# Patient Record
Sex: Female | Born: 1985 | Race: White | Hispanic: No | Marital: Married | Smoking: Former smoker
Health system: Southern US, Community
[De-identification: ages and names within clinical notes are randomized; demographics above are authoritative.]

## PROBLEM LIST (undated history)

## (undated) DIAGNOSIS — F329 Major depressive disorder, single episode, unspecified: Secondary | ICD-10-CM

## (undated) DIAGNOSIS — R51 Headache: Secondary | ICD-10-CM

## (undated) DIAGNOSIS — J189 Pneumonia, unspecified organism: Secondary | ICD-10-CM

## (undated) DIAGNOSIS — F32A Depression, unspecified: Secondary | ICD-10-CM

## (undated) DIAGNOSIS — F1990 Other psychoactive substance use, unspecified, uncomplicated: Secondary | ICD-10-CM

## (undated) DIAGNOSIS — F1910 Other psychoactive substance abuse, uncomplicated: Secondary | ICD-10-CM

## (undated) DIAGNOSIS — F419 Anxiety disorder, unspecified: Secondary | ICD-10-CM

## (undated) HISTORY — PX: TYMPANOSTOMY TUBE PLACEMENT: SHX32

## (undated) HISTORY — PX: MASTOIDECTOMY REVISION: SUR856

## (undated) HISTORY — DX: Pneumonia, unspecified organism: J18.9

## (undated) HISTORY — PX: CHOLECYSTECTOMY: SHX55

## (undated) HISTORY — PX: OVARIAN CYST REMOVAL: SHX89

## (undated) HISTORY — PX: FACIAL RECONSTRUCTION SURGERY: SHX631

---
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
## 2006-08-07 ENCOUNTER — Emergency Department (HOSPITAL_COMMUNITY): Admission: EM | Admit: 2006-08-07 | Discharge: 2006-08-07 | Payer: Self-pay | Admitting: Emergency Medicine

## 2007-03-21 ENCOUNTER — Inpatient Hospital Stay (HOSPITAL_COMMUNITY): Admission: AD | Admit: 2007-03-21 | Discharge: 2007-03-21 | Payer: Self-pay | Admitting: Obstetrics and Gynecology

## 2007-03-27 ENCOUNTER — Inpatient Hospital Stay (HOSPITAL_COMMUNITY): Admission: AD | Admit: 2007-03-27 | Discharge: 2007-03-27 | Payer: Self-pay | Admitting: Obstetrics and Gynecology

## 2007-03-27 ENCOUNTER — Inpatient Hospital Stay (HOSPITAL_COMMUNITY): Admission: AD | Admit: 2007-03-27 | Discharge: 2007-03-30 | Payer: Self-pay | Admitting: Obstetrics and Gynecology

## 2008-02-14 ENCOUNTER — Emergency Department (HOSPITAL_COMMUNITY): Admission: EM | Admit: 2008-02-14 | Discharge: 2008-02-14 | Payer: Self-pay | Admitting: Emergency Medicine

## 2009-09-04 ENCOUNTER — Inpatient Hospital Stay (HOSPITAL_COMMUNITY): Admission: AD | Admit: 2009-09-04 | Discharge: 2009-09-06 | Payer: Self-pay | Admitting: Obstetrics and Gynecology

## 2010-05-18 LAB — COMPREHENSIVE METABOLIC PANEL
ALT: 15 U/L (ref 0–35)
Albumin: 3.3 g/dL — ABNORMAL LOW (ref 3.5–5.2)
Alkaline Phosphatase: 42 U/L (ref 39–117)
Chloride: 106 mEq/L (ref 96–112)
Glucose, Bld: 110 mg/dL — ABNORMAL HIGH (ref 70–99)
Potassium: 3.2 mEq/L — ABNORMAL LOW (ref 3.5–5.1)
Sodium: 136 mEq/L (ref 135–145)
Total Bilirubin: 0.4 mg/dL (ref 0.3–1.2)
Total Protein: 6.1 g/dL (ref 6.0–8.3)

## 2010-05-18 LAB — DIFFERENTIAL
Basophils Relative: 1 % (ref 0–1)
Eosinophils Absolute: 0.3 10*3/uL (ref 0.0–0.7)
Monocytes Absolute: 0.4 10*3/uL (ref 0.1–1.0)
Monocytes Relative: 5 % (ref 3–12)
Neutrophils Relative %: 40 % — ABNORMAL LOW (ref 43–77)

## 2010-05-18 LAB — CBC
HCT: 34.5 % — ABNORMAL LOW (ref 36.0–46.0)
RBC: 3.68 MIL/uL — ABNORMAL LOW (ref 3.87–5.11)
RDW: 12.7 % (ref 11.5–15.5)
WBC: 8.3 10*3/uL (ref 4.0–10.5)

## 2010-05-18 LAB — URINALYSIS, ROUTINE W REFLEX MICROSCOPIC
Glucose, UA: NEGATIVE mg/dL
Leukocytes, UA: NEGATIVE
Protein, ur: NEGATIVE mg/dL
Specific Gravity, Urine: 1.015 (ref 1.005–1.030)
pH: 7 (ref 5.0–8.0)

## 2010-05-18 LAB — URINE MICROSCOPIC-ADD ON

## 2010-05-18 LAB — AMYLASE: Amylase: 44 U/L (ref 0–105)

## 2010-05-18 LAB — URINE CULTURE: Colony Count: NO GROWTH

## 2010-07-18 NOTE — Op Note (Signed)
[place]. [facility]  Patient:    Danielle Chandler, Danielle Chandler Visit Number: [id number] MRN: [id number]          Service Type: DSU Location: [facility] Attending Physician:  Susy Frizzle Dictated by:   Jeannett Senior Pollyann Kennedy, M.D. Proc. Date: 05/09/01 Admit Date:  05/09/2001                             Operative Report  PREOPERATIVE DIAGNOSIS:  Eustachian tube dysfunction, tympanic membrane atelectasis with conductive hearing loss.  POSTOPERATIVE DIAGNOSIS:  Eustachian tube dysfunction, tympanic membrane atelectasis with conductive hearing loss.  OPERATION PERFORMED:  Bilateral myringotomies with T-tube insertion.  SURGEON:  Jefry H. Pollyann Kennedy, M.D.  ANESTHESIA:  Mask inhalation.  COMPLICATIONS:  None.  FINDINGS:  Severe thickening, tympanosclerosis and atelectasis of the right tympanic membrane.  Severe retraction atelectasis of the left tympanic membrane with a large monomer of the anterior/inferior quadrant.  Small amount of fluid on the right side, none on the left.  The patient tolerated the procedure well, was awakened and transferred to recovery in stable condition.  REFERRING PHYSICIAN:  INDICATIONS FOR PROCEDURE:  The patient is a 25 year old with a history of chronic eustachian tube dysfunction.  She has had a couple of sets of tubes in the past, but none for several years.  The risks, benefits, alternatives and complications of the procedure were explained to the patient and her father, who seemed to understand and agreed to surgery.  DESCRIPTION OF PROCEDURE:  The patient was taken to the operating room and placed on the operating table in supine position.  Following induction of mask inhalation anesthesia, the ears were examined using operating microscope and cleaned of cerumen.  Anterior inferior myringotomy incisions were created bilaterally.  Modified T-tubes were placed without difficulty and inserted into their proper position with patent central  ventilation port.  Cortisporin was dripped into the ear canals.  Cotton balls were placed at the external meatus bilaterally.  The patient was then awakened from anesthesia and transferred to recovery. Dictated by:   Jeannett Senior Pollyann Kennedy, M.D. Attending Physician:  Susy Frizzle DD:  05/09/01 TD:  05/09/01 Job: [id number]

## 2010-07-18 NOTE — Op Note (Signed)
NAME:  Danielle Chandler, Danielle Chandler                           ACCOUNT NO.:  [id number]   MEDICAL RECORD NO.:  [id number]                   PATIENT TYPE:  AMB   LOCATION:  SDC                                  FACILITY:  [facility]   PHYSICIAN:  Juluis Mire, M.D.                DATE OF BIRTH:  August 21, 1985   DATE OF PROCEDURE:  06/01/2002  DATE OF DISCHARGE:                                 OPERATIVE REPORT   PREOPERATIVE DIAGNOSIS:  Right adnexal cyst.   POSTOPERATIVE DIAGNOSES:  1. Right paratubal cyst.  2. Left cyst of Morgagni.   PROCEDURES:  1. Open laparoscopy.  2. Right paratubal cystectomy.  3. Removal of left Morgagni cyst.   SURGEON:  Juluis Mire, M.D.   ANESTHESIA:  General endotracheal.   ESTIMATED BLOOD LOSS:  Minimal.   PACKS AND DRAINS:  None.   BLOOD REPLACED:  None.   COMPLICATIONS:  None.   INDICATIONS:  As dictated in the history and physical.   DESCRIPTION OF PROCEDURE:  The patient was taken to the OR and placed in  supine position.  After a satisfactory level of general anesthesia was  obtained, the patient was placed in the dorsal lithotomy position using the  Allen stirrups.  The abdomen, perineum, and vagina were prepped out with  Betadine.  The bladder was emptied by in-and-out catheterization.  The Hulka  tenaculum was put in place and secured.  The patient draped out for the  laparoscopy.  A subumbilical incision made with a knife.  The incision was  extended through subcutaneous tissue.  The fascia was entered sharply and  the incision in the fascia extended laterally.  The rectus muscles were  separated in the midline.  The peritoneum was entered sharply.  There was no  evidence of injury to adjacent organs.  No periumbilical adhesions were  noted.  An open laparoscopic trocar was put in place and secured.  The  laparoscope was introduced.  There was no evidence of injury to adjacent  organs.  A 5 mm trocar was put in place in the suprapubic area under  direct  visualization.  The appendix was seen to be normal.  The upper abdomen,  including the liver, was clear.  Both lateral gutters were unremarkable.  The gallbladder was surgically absent.  The uterus was elevated.  The uterus  was of normal size and shape.  Both ovaries were normal.  She had  approximately a 5 cm paratubal cyst on the right side near the fimbriated  end.  There was also a small Morgagni cyst in the left tube.  A third trocar  was put in place in the left lower quadrant after visualization of the  epigastric vessels.  At this point in time we elevated the cyst and made  incision in the peritoneum over the cyst and were able to dissect the cyst  from the underlying  tissue.  The cyst was then ruptured and removed through  the subumbilical port.  We brought about hemostasis with the cautery.  There  did not appear to be involvement of the fimbriated end.  The left cyst of  Morgagni was elevated.  The stalk was cauterized, incised, and that was  removed through the subumbilical port.  At the end of the procedure we had  good hemostasis.  There was very little damage to either tube, no active  endometriosis or other pelvic processes were noted.  The abdomen was  deflated of its carbon dioxide and all trocars removed.  The subumbilical  fascia was closed with figure-of-eights of 0 Vicryl, skin was closed with  subcuticulars of 4-0 Vicryl.  The suprapubic incision was closed with Steri-  Strips.  The Hulka tenaculum was then removed.  The patient was taken out of  the dorsal lithotomy position once alert and extubated and transferred to  the recovery room in good condition.  Sponge, instrument, and needle counts  reported as correct by the circulating nurse.                                               Juluis Mire, M.D.    JSM/MEDQ  D:  06/01/2002  T:  06/01/2002  Job:  [id number]

## 2010-07-18 NOTE — Op Note (Signed)
[place]  Patient:    Danielle Chandler, Danielle Chandler                        MRN: [id number] Proc. Date: 10/30/99 Adm. Date:  [id number] Disc. Date: [id number] Attending:  [place] Pho Damodar CC:         Gweneth Dimitri, M.D.                           Operative Report  PREOPERATIVE DIAGNOSIS:  Acute cholecystitis and cholelithiasis.  POSTOPERATIVE DIAGNOSIS:  Acute cholecystitis and cholelithiasis.  OPERATION PERFORMED: 1. Laparoscopic cholecystectomy. 2. Operative cholangiogram, attempt unsuccessful.  SURGEON:  Prabhakar D. Levie Heritage, M.D.  ASSISTANT:  Bronwen Betters, M.D.  ANESTHESIA:  Nurse.  OPERATIVE FINDINGS:  Laparoscopic examination showed gallbladder tense with bile.  No obvious gross external signs of cholecystitis.  The cystic duct appeared to be of small caliber and there was a stone at the distalmost aspect of the gallbladder neck.  There was no obvious dilatation of the ductal system appreciated.  After incision into the cystic duct, attempts to perform cannulation of the cystic duct were unsuccessful, hence, operative cholangiogram was not done.  DESCRIPTION OF PROCEDURE:  Under satisfactory general endotracheal anesthesia, with the patient in supine position, the abdomen was thoroughly prepped and draped in the usual manner.  About a 2 cm long transverse infraumbilical incision was made.  The skin and subcutaneous tissues were incised.  blunt and sharp dissection was carried out to identify the rectus fascia which was opened and after appropriate manipulation, the peritoneal cavity entered and Hasson laparoscopic port was introduced after taking 2-0 Vicryl stay sutures. The port was then fixed to this area with 0 Vicryl sutures and pneumoperitoneum was created with about 14 to 15 cm pressure.  Now two 5 mm ports were introduced through the right upper quadrant area.  Gallbladder was held with a grasper and exposed and at this time a 12 mm  probe was introduced through the epigastrium just to the right of the round ligament so as to facilitate dissection.  While the assistant held the gallbladder well exposed and under traction, the area of the porta hepatis was dissected by the dissector from the epigastric port.  After exposure of the cystic duct and the cystic artery.  The clip was applied to the distalmost aspect of the gallbladder and the cystic duct was opened.  The cholangiocath was now introduced through another stab incision and attempts were made to cannulate the cystic duct.  Several attempts were made which were unsuccessful; hence, it was felt that since the caliber of the cystic duct was small and preoperative bilirubin values were within normal limits, additional attempts to cannulate the cystic duct should be deferred and cholangiogram also deferred.  At this time the cystic duct was now doubly clipped with Hemoclips and it was cut.  Likewise the cystic artery was exposed and doubly clipped on either side and cut.  The dissection of the gallbladder now was done essentially with electrocautery.  During halfway freeing of the gallbladder there was accidental opening made which resulted in leakage of some of the bile which was aspirated and further dissection was carried out to remove the gallbladder.  It was removed without difficulty.  There was no bleeding from the gallbladder bed.  At this time copious amount of saline was used to irrigate the peritoneal cavity, to wash out the bile.  There were no stones seen in the peritoneal cavity.  One of the hemoclips was in the peritoneal cavity which could not be retrieved.  At this time the scope was now changed to the epigastric port and an endobag was passed through the umbilical port and by manipulation, gallbladder was received into the endobag pouch and removed without difficulty.  After removal of the gallbladder, once again the area was inspected, abdominal  cavity irrigated.  Hemostasis being satisfactory, all the ports were removed.  Wounds irrigated and closed in layers.  Throughout the procedure, the patients vital signs remained stable. The patient withstood the procedure well and was transferred to the recovery room in satisfactory general condition. DD:  10/30/99 TD:  10/31/99 Job: [id number]

## 2010-07-18 NOTE — H&P (Signed)
NAME:  Danielle, Chandler                           ACCOUNT NO.:  [id number]   MEDICAL RECORD NO.:  [id number]                   PATIENT TYPE:  AMB   LOCATION:  SDC                                  FACILITY:  [facility]   PHYSICIAN:  Juluis Mire, M.D.                DATE OF BIRTH:  05-15-1985   DATE OF ADMISSION:  06/01/2002  DATE OF DISCHARGE:                                HISTORY & PHYSICAL   HISTORY OF PRESENT ILLNESS:  The patient is a 25 year old nulligravida,  single white female who presents for diagnostic laparoscopy, laser standby  in relation to present admission.  The patient initially presented to our  office on 06/14/01.  She said at that point in time her cycles were irregular  with heavy flow and significant dysmenorrhea.  She had been followed by her  family doctor.  At that point in time we worked up anovulatory cycling with  negative work up and began on low dose birth control pills.  Subsequently in  6/03 she reported back to the office.  She evidently had been seen in the  emergency room at [facility] complaining of pelvic pain.  An  ultrasound did reveal and ovarian cyst.  We saw her back for ultrasound  evaluation at that time.  There was evidence of a simple cyst of the right  side measuring approximately 4 cm in greatest dimension.  Pain had basically  resolved at that time.  We decided to watch the cyst conservatively, we went  ahead and treated her with a course of antibiotics for possible inflammatory  changes.  It was of note, GC and chlamydia screening in the emergency room  were negative.  Follow up ultrasound in 11/03 revealed a 4.2 cm cyst  protruding at the cul-de-sac and it may have been peritubular as opposed to  ovarian nature.  This is persistent on follow up ultrasound and she now  presents for laparoscopic evaluation.   ALLERGIES:  No known drug allergies.   MEDICATIONS:  Radar, Effexor and birth control pills.   PAST MEDICAL HISTORY:  The  usual childhood diseases without any significant  sequela.   PREVIOUS SURGICAL HISTORY:  From a dog bite she did have cosmetic surgery in  '97.  She has had her gallbladder removed in 2000.  Had tubes in her ears in  2003.   OBSTETRICAL HISTORY:  None.   FAMILY HISTORY:  There is a family history of diabetes and heart disease.  A  maternal grandmother has lupus.   SOCIAL HISTORY:  Reveals she has 2-3 cigarettes per day, occasional alcohol  use.   REVIEW OF SYSTEMS:  Noncontributory.   PHYSICAL EXAMINATION:  VITAL SIGNS: The patient is afebrile with stable  vital signs.  HEENT EXAM: The patient is normocephalic.  Pupils are equal, round and  reactive to light and accommodation.  Extraocular movements are intact.  Sclerae and conjunctivae clear.  Oropharynx clear.  NECK: Without thyromegaly.  BREASTS: Not examined.  LUNGS: Clear.  CARDIAC SYSTEM: Regular rate, no murmurs or gallops.  ABDOMINAL EXAM: Benign, no mass, organomegaly or tenderness.  PELVIC: Normal external genitalia, vaginal mucosa is clear.  Cervix  unremarkable.  Uterus normal size, shape and contour.  Adnexae free of  masses or tenderness.  EXTREMITIES: Trace edema.  NEUROLOGIC EXAM: Grossly within normal limits.   IMPRESSION:  Persistent cystic enlargement in the right adnexa, possible  peritubular cyst.   PLAN:  The patient will undergo laparoscopic evaluation of the cystic  enlargement.  The risks of surgery have been discussed including the risk of  infection.  The risk of hemorrhage can necessitate transfusion with the risk  of HIV or hepatitis.  The risk of injury to adjacent organs, this includes  bladder, bowel or ureters and could require further exploratory surgery.  The risk of deep venous thrombosis and pulmonary embolus, potential pelvic  scar affecting future fertility, have all been discussed.                                                Juluis Mire, M.D.    JSM/MEDQ  D:  06/01/2002   T:  06/01/2002  Job:  [id number]

## 2010-07-18 NOTE — Discharge Summary (Signed)
[facility]  Patient:    Danielle Chandler, Danielle Chandler Visit Number: [id number] MRN: [id number]          Service Type: PSY Location: 100 0102 02 Attending Physician:  Veneta Penton. Dictated by:   Veneta Penton, M.D. Admit Date:  08/12/2001 Discharge Date: 08/16/2001                             Discharge Summary  REASON FOR ADMISSION:  This 25 year old female was admitted for inpatient psychiatric stabilization because of increasing suicidal ideation and threats to harm herself.  For further history of present illness, please see the patients psychiatric admission assessment.  PHYSICAL EXAMINATION AT THE TIME OF ADMISSION:  Significant for her demonstrating obesity and being status post numerous plastic surgical repairs of the right face after a dog bite as a young child.  LABORATORY EXAMINATION:  The patient underwent a laboratory workup to rule out any other medical problems contributing to her symptomatology.  Urine probe for gonorrhea and chlamydia were negative.  RPR was nonreactive.  CBC showed MCHC 34.3 and was otherwise unremarkable.  Basic metabolic panel was within normal limits.  Hepatic panel was within normal limits.  GGT was within normal limits.  TSH and free T4 were within normal limits.  Urine pregnancy test was negative.  Urine drug screen was positive for metabolites of marijuana and consistent with the patients history of using marijuana on a daily basis. Urinalysis was unremarkable.  The patient received no x-rays, no special procedures, no additional consultations.  She sustained no complications during the course of this hospitalization.  HOSPITAL COURSE:  On admission, the patient was oppositional, defiant.  Her affect and mood were depressed, anxious, and irritable.  She displayed poor impulse control, decreased concentration.  She was talkative, intrusive, needed constant redirected in the milieu.  She refused to focus on any  issues in treatment and particularly remained in denial of her chemical dependency problem.  The patient, throughout her hospital course, was avoidant in dealing with any treatment issues and attempted to split staff and her parents, playing one against the other.  She was noncompliant with taking Adderall XR as prescribed and was confronted about her denial of her chemical dependency problem as well as with her noncompliance with taking medication while hospitalized and her noncompliance with outpatient followup.  She remained focus on discharge.  She denied any homicidal or suicidal ideation and refused to address any further issues in treatment.  She remained gamey, manipulative, and when confronted, stated that she would contact her parents and have her parents remove her from the hospital against medical advice.  I contacted her mother via voice mail and discussed with the patients mother the fact that the patient had refused to address any of her chemical dependency issues or any other issues in psychotherapy, was noncompliant with treatment recommendations, was noncompliant with taking her medication as instructed, and was noncompliant with addressing any of the issues regarding her chemical dependency.  I discussed with her mother the fact that I could not follow the patient on an outpatient basis as she refused to participate in treatment and when given the opportunity to make changes, refused to accept any changes so that she could be compliant with treatment.  I discussed with mother the fact that the patient has been oppositional and defiant and refusing to address any treatment issues, particularly her use of marijuana.  I addressed with mother  the fact that it would be my recommendation that the patient be adjudicated ungovernable and have her court ordered into outpatient treatment for both her mood disorder and chemical dependency problem as well as weekly urine  drug screens.  I also continued to recommend that the patient take Effexor XR and Adderall.  The patient, however, has continued to refuse this.  At the time of discharge, the patient has requested that her parents take her out of the hospital against medical advice and the parents have agreed to this. Consequently, the patient is discharged against medical advice.  As the patient has been noncompliant with medication and the patients medication is probably negated by her marijuana use, she is discharged on no medication as she has refused to accept any.  The patient will follow up with her primary care physician and outpatient psychiatrist of choice as the patient, her mother, and I have agreed that an irrevocable rift has occurred in the treatment relationship and that we will terminate our professional relationship at this time.  The patient is discharged on a unrestricted level of activity and a regular diet.  She will follow up with her primary care physician for all further aspects of her medical care.  It is highly recommended that the patient continue to seek outpatient treatment and that she would benefit treatment for her psychiatric problems.  It is highly recommended that mother have the patient court ordered into treatment because of the noncompliance issues as described above. Dictated by:   Veneta Penton, M.D. Attending Physician:  Veneta Penton DD:  08/17/01 TD:  08/17/01 Job: [id number]

## 2010-07-18 NOTE — H&P (Signed)
[facility]  Patient:    Danielle Chandler, ORTNER Visit Number: [id number] MRN: [id number]          Service Type: PSY Location: 100 0102 02 Attending Physician:  Veneta Penton. Dictated by:   Jasmine Pang, M.D. Admit Date:  08/12/2001   CC:         Dr. Cindie Crumbly, [facility]   Psychiatric Admission Assessment  DATE OF ADMISSION:  August 12, 2001.  She is on the adolescent unit.  HPI:  The patient is a 25 year old female from [place], referred due to suicidal ideation threats.  She reportedly stayed away from home all night on the night prior to admission.  Her family found out she was not with the person she was supposed to be with.  She would not disclose her location and became angry when confronted by her family.  She is experiencing depression with multiple neurovegetative symptoms, including anhedonia, anergia, and difficulty concentrating, hopelessness, worthlessness, and feelings of low self esteem.  She had also become increasingly irritable and was aggressive towards her mother prior to admission.  In addition, she threatened to kill herself, "cut my throat in the bathtub."  PAST PSYCHIATRIC HISTORY:  The patient sees Dr. Haynes Hoehn on an outpatient basis.  She has been at [facility] for treatment.  She has a history of bulimia nervosa but stopped this 2 months ago.  Her mother states this condition has improved.  She stopped the medications that Dr. Haynes Hoehn had been treating her with, including Effexor XR 150 mg q.d. and Adderall 20 mg q.a.m.  She has not used either for the past 2-3 weeks.  SUBSTANCE ABUSE HISTORY:  Denies.  She does smoke 1/2 pack of cigarettes per day.  PAST MEDICAL HISTORY:  The patient is healthy except for nausea, which she says she has frequently at home also.  She has seasonal allergies.  ALLERGIES:  No known drug allergies.  She is allergic to cats.  CURRENT MEDICATIONS:  As for past psychiatric  history.  Also on Ortho Tri-Cyclen.  FAMILY/SOCIAL HISTORY:  The patient lives with her mother and father.  She has not been doing well in high school.  This year, her grades dropped after the first 9 weeks.  She has no history of physical or sexual abuse.  There is a family psychiatric history of depression.  She has no legal problems.  MENTAL STATUS EXAMINATION:  The patient presented as a friendly, cooperative female who was lying in bed, with intermittent eye contact.  Speech was soft and slow.  There was psychomotor retardation.  Mood was depressed, affect sad, constricted.  Positive suicidal ideation as per HPI.  No homicidal ideation, no psychosis or perceptual disturbance.  Thought processes logical and goal directed.  Thought content:  No predominant theme.  No cognitive exam, the patient was alert and oriented x4.  Short term and long term memory were adequate.  General fund of knowledge were age and education level appropriate. Attention and concentration diminished.  Insight poor, judgment poor.  ADMISSION DIAGNOSES: Axis I:    1. Major depressive disorder, recurrent, severe.            2. 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 Axis III:  None. Axis IV:   Moderate. Axis V:    Global assessment of function of 30.  ASSETS AND STRENGTHS:  Patient is healthy, she has a supportive family.  PROBLEMS:  Mood instability with threats to  harm herself.  SHORT TERM TREATMENT GOAL:  Resolution of suicidal ideation.  LONG TERM TREATMENT GOAL:  Resolution of mood instability and aggressive behaviors.  INITIAL PLAN OF CARE:  Will restart psych meds as per Dr. Haynes Hoehn.  She is nauseated today and I will not prescribe any medications today other than Phenergan.  The patient will be involved in unit therapeutic groups and activities, and patient will be involved in family therapy.  ESTIMATED LENGTH OF INPATIENT TREATMENT:  Five to seven  days.  POST HOSPITAL CARE PLAN:  Return home to live with family.  FOLLOWUP PSYCHIATRIC TREATMENT:  With Dr. Haynes Hoehn. Dictated by:   Jasmine Pang, M.D. Attending Physician:  Veneta Penton DD:  08/13/01 TD:  08/15/01 Job: [id number]

## 2010-09-11 ENCOUNTER — Other Ambulatory Visit: Payer: Self-pay | Admitting: Obstetrics and Gynecology

## 2010-09-11 LAB — ANTIBODY SCREEN: Antibody Screen: NEGATIVE

## 2010-09-11 LAB — CBC
HCT: 38 % (ref 36–46)
Platelets: 329 10*3/uL (ref 150–399)

## 2010-09-11 LAB — HIV ANTIBODY (ROUTINE TESTING W REFLEX): HIV: NONREACTIVE

## 2010-10-08 LAB — CULTURE, OB URINE

## 2010-11-20 LAB — CBC
HCT: 28.9 — ABNORMAL LOW
HCT: 35 — ABNORMAL LOW
Hemoglobin: 10 — ABNORMAL LOW
Hemoglobin: 12.2
MCHC: 34.6
MCV: 92.5
MCV: 93
RBC: 3.79 — ABNORMAL LOW
RDW: 14.2
WBC: 13.8 — ABNORMAL HIGH

## 2010-12-04 LAB — URINALYSIS, ROUTINE W REFLEX MICROSCOPIC
Glucose, UA: NEGATIVE mg/dL
Ketones, ur: NEGATIVE mg/dL
Protein, ur: NEGATIVE mg/dL

## 2010-12-04 LAB — WET PREP, GENITAL: Trich, Wet Prep: NONE SEEN

## 2010-12-04 LAB — GC/CHLAMYDIA PROBE AMP, GENITAL
Chlamydia, DNA Probe: NEGATIVE
GC Probe Amp, Genital: NEGATIVE

## 2010-12-04 LAB — URINE MICROSCOPIC-ADD ON

## 2010-12-18 LAB — CBC
Hemoglobin: 13.2
RBC: 4.33

## 2010-12-18 LAB — URINALYSIS, ROUTINE W REFLEX MICROSCOPIC
Glucose, UA: NEGATIVE
Hgb urine dipstick: NEGATIVE
Protein, ur: NEGATIVE

## 2010-12-18 LAB — BASIC METABOLIC PANEL
CO2: 22
Calcium: 9.3
GFR calc Af Amer: >60
GFR calc non Af Amer: >60
Sodium: 132 — ABNORMAL LOW

## 2010-12-18 LAB — DIFFERENTIAL
Lymphocytes Relative: 31
Lymphs Abs: 2.9
Monocytes Absolute: 0.3
Monocytes Relative: 3
Neutro Abs: 6.3

## 2010-12-18 LAB — URINE MICROSCOPIC-ADD ON

## 2011-02-25 ENCOUNTER — Emergency Department (HOSPITAL_COMMUNITY)
Admission: EM | Admit: 2011-02-25 | Discharge: 2011-02-25 | Disposition: A | Discharge status: Home / Self Care | Payer: Medicaid Other | Attending: Emergency Medicine | Admitting: Emergency Medicine

## 2011-02-25 ENCOUNTER — Encounter: Payer: Self-pay | Admitting: *Deleted

## 2011-02-25 DIAGNOSIS — R062 Wheezing: Secondary | ICD-10-CM | POA: Insufficient documentation

## 2011-02-25 DIAGNOSIS — J019 Acute sinusitis, unspecified: Secondary | ICD-10-CM | POA: Insufficient documentation

## 2011-02-25 DIAGNOSIS — R51 Headache: Secondary | ICD-10-CM | POA: Insufficient documentation

## 2011-02-25 DIAGNOSIS — O99891 Other specified diseases and conditions complicating pregnancy: Secondary | ICD-10-CM | POA: Insufficient documentation

## 2011-02-25 MED ORDER — AZITHROMYCIN 250 MG PO TABS: 250.0000 mg | ORAL_TABLET | Freq: Every day | ORAL | Status: AC

## 2011-02-25 MED ORDER — ALBUTEROL SULFATE HFA 108 (90 BASE) MCG/ACT IN AERS
2.0000 | INHALATION_SPRAY | RESPIRATORY_TRACT | Status: DC | PRN
  Administered 2011-02-25: 2 via RESPIRATORY_TRACT
  Filled 2011-02-25: qty 6.7

## 2011-02-25 MED ORDER — ONDANSETRON 8 MG PO TBDP
8.0000 mg | ORAL_TABLET | Freq: Once | ORAL | Status: AC
  Administered 2011-02-25: 8 mg via ORAL
  Filled 2011-02-25: qty 1

## 2011-02-25 NOTE — ED Notes (Signed)
Pt state she started to have facial pain and headache last night. No other c/o

## 2011-02-25 NOTE — ED Provider Notes (Signed)
History     CSN: [id number]  Arrival date & time 02/25/11  1478   First MD Initiated Contact with Danielle Chandler 02/25/11 2000      Chief Complaint  Danielle Chandler presents with  . Facial Pain    (Consider location/radiation/quality/duration/timing/severity/associated sxs/prior treatment) HPI Comments: Danielle Chandler reports that she has had congestion, cough, rhinorrhea, and PND for the past week.  Last evening she began to have a headache.  Pain located on the left maxillary sinuses.  She also is complaining of left upper teeth pain since yesterday.  She reports that the pain improves when she applies warm compresses to the area.  She has also tried using a [employer] which has helped somewhat.  She has also taken Mucinex which helped somewhat.  She is currently [redacted] weeks pregnant.  She has not had any vaginal bleeding, abdominal pain, leakage of fluid, or passage of tissue.  Danielle Chandler is a 25 y.o. female presenting with headaches. The history is provided by the Danielle Chandler.  Headache  This is a new problem. The current episode started yesterday. The problem occurs constantly. The problem has been gradually worsening. Associated symptoms include nausea. Pertinent negatives include no fever.    History reviewed. No pertinent past medical history.  Past Surgical History  Procedure Date  . Cholecystectomy     History reviewed. No pertinent family history.  History  Substance Use Topics  . Smoking status: Never Smoker   . Smokeless tobacco: Not on file  . Alcohol Use: No    OB History    Grav Para Term Preterm Abortions TAB SAB Ect Mult Living   1               Review of Systems  Constitutional: Negative for fever and chills.  HENT: Positive for congestion, rhinorrhea, postnasal drip and sinus pressure. Negative for ear pain, sore throat, trouble swallowing, neck pain, neck stiffness and voice change.   Gastrointestinal: Positive for nausea. Negative for abdominal pain.  Genitourinary: Negative for  dysuria, vaginal bleeding and pelvic pain.  Musculoskeletal: Negative for gait problem.  Neurological: Positive for headaches. Negative for dizziness, syncope and light-headedness.  Psychiatric/Behavioral: Negative for confusion.    Allergies  Review of Danielle Chandler's allergies indicates no known allergies.  Home Medications  No current outpatient prescriptions on file.  BP 122/84  Pulse 100  Temp(Src) 98.1 F (36.7 C) (Oral)  Resp 20  Ht 5\' 4"  (1.626 m)  Wt 190 lb (86.183 kg)  BMI 32.61 kg/m2  SpO2 98%  Physical Exam  Nursing note and vitals reviewed. Constitutional: She is oriented to person, place, and time. She appears well-developed and well-nourished. No distress.  HENT:  Head: Normocephalic and atraumatic.  Right Ear: Tympanic membrane and ear canal normal.  Left Ear: Tympanic membrane and ear canal normal.  Nose: Mucosal edema and rhinorrhea present. Right sinus exhibits no maxillary sinus tenderness and no frontal sinus tenderness. Left sinus exhibits maxillary sinus tenderness. Left sinus exhibits no frontal sinus tenderness.  Mouth/Throat: Uvula is midline, oropharynx is clear and moist and mucous membranes are normal.  Eyes: EOM are normal. Pupils are equal, round, and reactive to light.  Neck: Normal range of motion. Neck supple.  Cardiovascular: Normal rate, regular rhythm and normal heart sounds.   Pulmonary/Chest: Effort normal. No respiratory distress. She has wheezes.  Musculoskeletal: Normal range of motion.  Neurological: She is alert and oriented to person, place, and time. She has normal strength and normal reflexes. No cranial nerve deficit or sensory  deficit. Coordination and gait normal.  Skin: Skin is warm and dry. No rash noted. She is not diaphoretic.  Psychiatric: She has a normal mood and affect.    ED Course  Procedures (including critical care time)  Labs Reviewed - No data to display No results found.   1. Acute sinusitis     Danielle Chandler  given an albuterol inhaler while in the ED due to diffuse wheezing in her lungs.  She reports that she felt better after albuterol treatment.  MDM  Signs and symptoms consistent with Acute Sinusitis.  She has a normal neurological exam.  Danielle Chandler is currently pregnant.  She was given Rx for Azithromycin which is Pregnancy Category B.  She was also given referral to OB.         Pascal Lux Wingen 02/26/11 1610

## 2011-02-28 NOTE — ED Provider Notes (Signed)
Medical screening examination/treatment/procedure(s) were performed by non-physician practitioner and as supervising physician I was immediately available for consultation/collaboration.   Gerhard Munch, MD 02/28/11 [phone]

## 2011-03-03 NOTE — L&D Delivery Note (Signed)
Delivery Note At 5:58 PM a viable female was delivered via Vaginal, Spontaneous Delivery (Presentation: ;  ).  APGAR: , ; weight 6 lb 4.2 oz (2840 g).   Placenta status: Intact, Spontaneous.  Cord: 3 vessels   Anesthesia: Epidural  Episiotomy: None Lacerations: none Est. Blood Loss (mL): 200cc  Mom to postpartum.  Baby to nursery-stable.  Cam Hai 03/17/2011, 6:21 PM

## 2011-03-10 DIAGNOSIS — O0930 Supervision of pregnancy with insufficient antenatal care, unspecified trimester: Secondary | ICD-10-CM

## 2011-03-10 DIAGNOSIS — R1110 Vomiting, unspecified: Secondary | ICD-10-CM

## 2011-03-12 ENCOUNTER — Inpatient Hospital Stay (HOSPITAL_COMMUNITY)
Admission: AD | Admit: 2011-03-12 | Discharge: 2011-03-12 | Disposition: A | Discharge status: Home / Self Care | Payer: Medicaid Other | Source: Ambulatory Visit | Attending: Obstetrics & Gynecology | Admitting: Obstetrics & Gynecology

## 2011-03-12 ENCOUNTER — Encounter (HOSPITAL_COMMUNITY): Payer: Self-pay | Admitting: *Deleted

## 2011-03-12 DIAGNOSIS — O4700 False labor before 37 completed weeks of gestation, unspecified trimester: Secondary | ICD-10-CM | POA: Insufficient documentation

## 2011-03-12 DIAGNOSIS — O479 False labor, unspecified: Secondary | ICD-10-CM

## 2011-03-12 DIAGNOSIS — O219 Vomiting of pregnancy, unspecified: Secondary | ICD-10-CM

## 2011-03-12 DIAGNOSIS — O4703 False labor before 37 completed weeks of gestation, third trimester: Secondary | ICD-10-CM

## 2011-03-12 HISTORY — DX: Headache: R51

## 2011-03-12 HISTORY — DX: Anxiety disorder, unspecified: F41.9

## 2011-03-12 LAB — POCT FERN TEST: Fern Test: NEGATIVE

## 2011-03-12 MED ORDER — ONDANSETRON 8 MG PO TBDP: 8.0000 mg | ORAL_TABLET | Freq: Three times a day (TID) | ORAL | Status: DC | PRN

## 2011-03-12 MED ORDER — LACTATED RINGERS IV BOLUS (SEPSIS)
1000.0000 mL | Freq: Once | INTRAVENOUS | Status: AC
  Administered 2011-03-12: 1000 mL via INTRAVENOUS

## 2011-03-12 MED ORDER — ONDANSETRON HCL 4 MG/2ML IJ SOLN
4.0000 mg | Freq: Once | INTRAMUSCULAR | Status: AC
  Administered 2011-03-12: 4 mg via INTRAVENOUS
  Filled 2011-03-12: qty 2

## 2011-03-12 MED ORDER — NALBUPHINE HCL 10 MG/ML IJ SOLN
5.0000 mg | Freq: Once | INTRAMUSCULAR | Status: AC
  Administered 2011-03-12: 5 mg via INTRAVENOUS
  Filled 2011-03-12: qty 0.5

## 2011-03-12 NOTE — Progress Notes (Signed)
Pt states she has been very stressed out & upset, her fiancee was arrested a few days ago.

## 2011-03-12 NOTE — Progress Notes (Signed)
Pt brought from car in WC crying, in severe pain.  C/O painful uc's for past 2 days, had ? SROM on way to hospital today, denies bleeding.

## 2011-03-12 NOTE — Progress Notes (Signed)
Pt states she was being seen by Waynard Reeds & was discharged from the practice because she missed an appt.  Pt states she has scheduled appt in OB clinic next week.

## 2011-03-12 NOTE — ED Provider Notes (Signed)
Danielle Chandler is a 26 y.o. year old G93P1001 female at [redacted]w[redacted]d weeks gestation who presents to [facility] reporting strong contractions, NV and possible LOF w/ vomitting. She reports pos FM and denies VB.  Maternal Medical History:  Reason for admission: Reason for admission: contractions and nausea.  Contractions: Onset was more than 2 days ago.   Frequency: regular.   Perceived severity is moderate.    Fetal activity: Perceived fetal activity is normal.   Last perceived fetal movement was within the past hour.    Prenatal Complications - Diabetes: none.    OB History    Grav Para Term Preterm Abortions TAB SAB Ect Mult Living   2 1 1       1      Past Medical History  Diagnosis Date  . Headache   . Anxiety    Past Surgical History  Procedure Date  . Cholecystectomy   . Facial reconstruction surgery   . Cholecystectomy   . Ovarian cyst removal    Family History: family history includes Diabetes in her maternal aunt, maternal grandfather, and maternal grandmother and Heart disease in her maternal aunt and maternal grandfather. Social History:  reports that she has never smoked. She does not have any smokeless tobacco history on file. She reports that she does not drink alcohol or use illicit drugs.  Review of Systems  Constitutional: Negative for fever and chills.  Eyes: Negative for blurred vision.  Gastrointestinal: Positive for nausea, vomiting and abdominal pain (contractions). Negative for diarrhea and constipation.  Neurological: Negative for headaches.    Dilation: Closed Exam by:: Danielle Chandler CNM Blood pressure 106/68, pulse 85, temperature 98.1 F (36.7 C), temperature source Oral, resp. rate 24, last menstrual period 07/01/2010. Maternal Exam:  Uterine Assessment: Contraction strength is mild.  Contraction frequency is regular.   Abdomen: Fetal presentation: vertex  Introitus: Normal vulva. Normal vagina.  Pelvis: adequate for delivery.   Cervix: Cervix  evaluated by sterile speculum exam and digital exam.     Fetal Exam Fetal Monitor Review: Mode: ultrasound.   Baseline rate: 135.  Variability: moderate (6-25 bpm).   Pattern: accelerations present and no decelerations.    Fetal State Assessment: Category I - tracings are normal.     Physical Exam  Constitutional: She is oriented to person, place, and time. She appears well-developed and well-nourished.  Eyes: Pupils are equal, round, and reactive to light.  Cardiovascular: Normal rate.   Respiratory: Effort normal.  GI: Soft. There is no tenderness.  Genitourinary: Vagina normal and uterus normal.  Neurological: She is alert and oriented to person, place, and time.  Skin: Skin is warm and dry.  Psychiatric: Her mood appears not anxious.    Prenatal labs: ABO, Rh: A/Positive/-- (07/12 0000) Antibody: Negative (07/12 0000) Rubella: Immune (07/12 0000) RPR: Nonreactive (07/12 0000)  HBsAg: Negative (07/12 0000)  HIV: Non-reactive (07/12 0000)  GBS:     Cervix recheck at 1645 2-3/long-3.  N/V improved w/ Zofran. Nubain given. Pain 3/10 Pool: neg Fern: neg  Assessment/Plan: 1. False labor 2. N/V resolved  Plan: 1. D/C home 2. Labor precautions, FKCs   Clovis Mankins 03/12/2011, 4:04 PM

## 2011-03-17 ENCOUNTER — Inpatient Hospital Stay (HOSPITAL_COMMUNITY)
Admission: AD | Admit: 2011-03-17 | Discharge: 2011-03-19 | DRG: 775 | Disposition: A | Discharge status: Home / Self Care | Payer: Medicaid Other | Source: Ambulatory Visit | Attending: Obstetrics & Gynecology | Admitting: Obstetrics & Gynecology

## 2011-03-17 ENCOUNTER — Encounter (HOSPITAL_COMMUNITY): Payer: Self-pay | Admitting: Anesthesiology

## 2011-03-17 ENCOUNTER — Inpatient Hospital Stay (HOSPITAL_COMMUNITY): Payer: Medicaid Other | Admitting: Anesthesiology

## 2011-03-17 ENCOUNTER — Encounter (HOSPITAL_COMMUNITY): Payer: Self-pay

## 2011-03-17 LAB — CBC
HCT: 37 % (ref 36.0–46.0)
MCH: 29.8 pg (ref 26.0–34.0)
MCV: 88.1 fL (ref 78.0–100.0)
RBC: 4.2 MIL/uL (ref 3.87–5.11)
RDW: 13.7 % (ref 11.5–15.5)
WBC: 13.3 10*3/uL — ABNORMAL HIGH (ref 4.0–10.5)

## 2011-03-17 LAB — STREP B DNA PROBE: GBS: POSITIVE

## 2011-03-17 LAB — GC/CHLAMYDIA PROBE AMP, GENITAL: Chlamydia: NEGATIVE

## 2011-03-17 MED ORDER — BUTORPHANOL TARTRATE 2 MG/ML IJ SOLN: 2.0000 mg | Freq: Once | INTRAMUSCULAR | Status: DC

## 2011-03-17 MED ORDER — SENNOSIDES-DOCUSATE SODIUM 8.6-50 MG PO TABS
2.0000 | ORAL_TABLET | Freq: Every day | ORAL | Status: DC
  Administered 2011-03-17 – 2011-03-18 (×2): 2 via ORAL

## 2011-03-17 MED ORDER — PHENYLEPHRINE 40 MCG/ML (10ML) SYRINGE FOR IV PUSH (FOR BLOOD PRESSURE SUPPORT)
80.0000 ug | PREFILLED_SYRINGE | INTRAVENOUS | Status: DC | PRN
  Filled 2011-03-17: qty 5

## 2011-03-17 MED ORDER — TETANUS-DIPHTH-ACELL PERTUSSIS 5-2.5-18.5 LF-MCG/0.5 IM SUSP
0.5000 mL | Freq: Once | INTRAMUSCULAR | Status: AC
  Administered 2011-03-18: 0.5 mL via INTRAMUSCULAR
  Filled 2011-03-17: qty 0.5

## 2011-03-17 MED ORDER — SODIUM BICARBONATE 8.4 % IV SOLN
INTRAVENOUS | Status: DC | PRN
  Administered 2011-03-17: 4 mL via EPIDURAL

## 2011-03-17 MED ORDER — FENTANYL 2.5 MCG/ML BUPIVACAINE 1/10 % EPIDURAL INFUSION (WH - ANES)
INTRAMUSCULAR | Status: DC | PRN
  Administered 2011-03-17: 14 mL/h via EPIDURAL

## 2011-03-17 MED ORDER — LACTATED RINGERS IV SOLN
500.0000 mL | Freq: Once | INTRAVENOUS | Status: AC
  Administered 2011-03-17: 500 mL via INTRAVENOUS

## 2011-03-17 MED ORDER — OXYCODONE-ACETAMINOPHEN 5-325 MG PO TABS
2.0000 | ORAL_TABLET | ORAL | Status: DC | PRN
  Administered 2011-03-17: 2 via ORAL
  Filled 2011-03-17: qty 2

## 2011-03-17 MED ORDER — PHENYLEPHRINE 40 MCG/ML (10ML) SYRINGE FOR IV PUSH (FOR BLOOD PRESSURE SUPPORT): 80.0000 ug | PREFILLED_SYRINGE | INTRAVENOUS | Status: DC | PRN

## 2011-03-17 MED ORDER — OXYCODONE-ACETAMINOPHEN 5-325 MG PO TABS
1.0000 | ORAL_TABLET | ORAL | Status: DC | PRN
  Administered 2011-03-17: 1 via ORAL
  Administered 2011-03-18 (×2): 2 via ORAL
  Administered 2011-03-18 (×3): 1 via ORAL
  Administered 2011-03-18: 2 via ORAL
  Administered 2011-03-18 – 2011-03-19 (×5): 1 via ORAL
  Filled 2011-03-17: qty 2
  Filled 2011-03-17 (×4): qty 1
  Filled 2011-03-17: qty 2
  Filled 2011-03-17: qty 1
  Filled 2011-03-17: qty 2
  Filled 2011-03-17 (×2): qty 1
  Filled 2011-03-17: qty 2

## 2011-03-17 MED ORDER — LIDOCAINE HCL (PF) 1 % IJ SOLN
30.0000 mL | INTRAMUSCULAR | Status: DC | PRN
  Filled 2011-03-17: qty 30

## 2011-03-17 MED ORDER — EPHEDRINE 5 MG/ML INJ
10.0000 mg | INTRAVENOUS | Status: DC | PRN
  Filled 2011-03-17: qty 4

## 2011-03-17 MED ORDER — LACTATED RINGERS IV SOLN: 500.0000 mL | INTRAVENOUS | Status: DC | PRN

## 2011-03-17 MED ORDER — ONDANSETRON HCL 4 MG PO TABS: 4.0000 mg | ORAL_TABLET | ORAL | Status: DC | PRN

## 2011-03-17 MED ORDER — PRENATAL MULTIVITAMIN CH
1.0000 | ORAL_TABLET | Freq: Every day | ORAL | Status: DC
  Administered 2011-03-18 – 2011-03-19 (×2): 1 via ORAL
  Filled 2011-03-17 (×2): qty 1

## 2011-03-17 MED ORDER — DIPHENHYDRAMINE HCL 25 MG PO CAPS: 25.0000 mg | ORAL_CAPSULE | Freq: Four times a day (QID) | ORAL | Status: DC | PRN

## 2011-03-17 MED ORDER — ONDANSETRON HCL 4 MG/2ML IJ SOLN: 4.0000 mg | Freq: Four times a day (QID) | INTRAMUSCULAR | Status: DC | PRN

## 2011-03-17 MED ORDER — LANOLIN HYDROUS EX OINT: TOPICAL_OINTMENT | CUTANEOUS | Status: DC | PRN

## 2011-03-17 MED ORDER — ACETAMINOPHEN 325 MG PO TABS
650.0000 mg | ORAL_TABLET | ORAL | Status: DC | PRN
  Administered 2011-03-17: 650 mg via ORAL
  Filled 2011-03-17: qty 2

## 2011-03-17 MED ORDER — IBUPROFEN 600 MG PO TABS
600.0000 mg | ORAL_TABLET | Freq: Four times a day (QID) | ORAL | Status: DC | PRN
  Administered 2011-03-17: 600 mg via ORAL
  Filled 2011-03-17: qty 1

## 2011-03-17 MED ORDER — IBUPROFEN 600 MG PO TABS
600.0000 mg | ORAL_TABLET | Freq: Four times a day (QID) | ORAL | Status: DC
  Administered 2011-03-17 – 2011-03-19 (×8): 600 mg via ORAL
  Filled 2011-03-17: qty 2
  Filled 2011-03-17 (×3): qty 1
  Filled 2011-03-17: qty 2
  Filled 2011-03-17: qty 1

## 2011-03-17 MED ORDER — CITRIC ACID-SODIUM CITRATE 334-500 MG/5ML PO SOLN: 30.0000 mL | ORAL | Status: DC | PRN

## 2011-03-17 MED ORDER — DIBUCAINE 1 % RE OINT: 1.0000 "application " | TOPICAL_OINTMENT | RECTAL | Status: DC | PRN

## 2011-03-17 MED ORDER — LACTATED RINGERS IV SOLN
INTRAVENOUS | Status: DC
  Administered 2011-03-17 (×2): via INTRAVENOUS

## 2011-03-17 MED ORDER — WITCH HAZEL-GLYCERIN EX PADS: 1.0000 "application " | MEDICATED_PAD | CUTANEOUS | Status: DC | PRN

## 2011-03-17 MED ORDER — FENTANYL 2.5 MCG/ML BUPIVACAINE 1/10 % EPIDURAL INFUSION (WH - ANES)
14.0000 mL/h | INTRAMUSCULAR | Status: DC
  Administered 2011-03-17: 14 mL/h via EPIDURAL
  Filled 2011-03-17 (×2): qty 60

## 2011-03-17 MED ORDER — OXYTOCIN BOLUS FROM INFUSION
500.0000 mL | Freq: Once | INTRAVENOUS | Status: DC
  Filled 2011-03-17: qty 1000
  Filled 2011-03-17: qty 500

## 2011-03-17 MED ORDER — SIMETHICONE 80 MG PO CHEW: 80.0000 mg | CHEWABLE_TABLET | ORAL | Status: DC | PRN

## 2011-03-17 MED ORDER — EPHEDRINE 5 MG/ML INJ: 10.0000 mg | INTRAVENOUS | Status: DC | PRN

## 2011-03-17 MED ORDER — DIPHENHYDRAMINE HCL 50 MG/ML IJ SOLN: 12.5000 mg | INTRAMUSCULAR | Status: DC | PRN

## 2011-03-17 MED ORDER — FLEET ENEMA 7-19 GM/118ML RE ENEM: 1.0000 | ENEMA | RECTAL | Status: DC | PRN

## 2011-03-17 MED ORDER — ONDANSETRON HCL 4 MG/2ML IJ SOLN: 4.0000 mg | INTRAMUSCULAR | Status: DC | PRN

## 2011-03-17 MED ORDER — ALBUTEROL SULFATE HFA 108 (90 BASE) MCG/ACT IN AERS
2.0000 | INHALATION_SPRAY | Freq: Four times a day (QID) | RESPIRATORY_TRACT | Status: DC | PRN
  Filled 2011-03-17: qty 6.7

## 2011-03-17 MED ORDER — ZOLPIDEM TARTRATE 5 MG PO TABS: 5.0000 mg | ORAL_TABLET | Freq: Every evening | ORAL | Status: DC | PRN

## 2011-03-17 MED ORDER — BUTORPHANOL TARTRATE 2 MG/ML IJ SOLN
INTRAMUSCULAR | Status: AC
  Filled 2011-03-17: qty 1

## 2011-03-17 MED ORDER — OXYTOCIN 20 UNITS IN LACTATED RINGERS INFUSION - SIMPLE
125.0000 mL/h | Freq: Once | INTRAVENOUS | Status: AC
  Administered 2011-03-17: 999 mL/h via INTRAVENOUS

## 2011-03-17 MED ORDER — BENZOCAINE-MENTHOL 20-0.5 % EX AERO: 1.0000 "application " | INHALATION_SPRAY | CUTANEOUS | Status: DC | PRN

## 2011-03-17 NOTE — Progress Notes (Signed)
Dr. Aviva Signs at patient bedside. SVE performed, bedside ultrasound requested and completed to confirm vertex position

## 2011-03-17 NOTE — Progress Notes (Signed)
Patient rating contraction pain 10/10 requesting epidural. Staff in to assist with admission polciy and procedures pre epidural

## 2011-03-17 NOTE — Progress Notes (Signed)
Pt c/o increasing pressure and states, needs to push

## 2011-03-17 NOTE — Progress Notes (Signed)
Iv started by Jackalyn Lombard RN, unable to get labs. Lab notified to come draw patients labs stat.

## 2011-03-17 NOTE — Progress Notes (Addendum)
Dr. Angelena Sole updated on patients vital signs. Continue to monitor

## 2011-03-17 NOTE — Progress Notes (Signed)
Danielle Chandler is a 26 y.o. G2P1001 at [redacted]w[redacted]d   Subjective: Comfortable with epid, but c/o headache only  Objective: BP 117/71  Pulse 64  Temp(Src) 97.7 F (36.5 C) (Oral)  Resp 16  SpO2 100%  LMP 07/01/2010      FHT:  FHR: 140 bpm, variability: moderate,  accelerations:  Present,  decelerations:  Absent UC:   irregular, every 3-5 minutes SVE:   Dilation: 9 Effacement (%): 100 Station: -1;0 Exam by:: Pincus Badder CNMSROM with exam  Labs: Lab Results  Component Value Date   WBC 13.3* 03/17/2011   HGB 12.5 03/17/2011   HCT 37.0 03/17/2011   MCV 88.1 03/17/2011   PLT 233 03/17/2011    Assessment / Plan: Active labor Headache with nl BPs  Will reexamine in 1 hr or prn Tylenol for head    Cam Hai 03/17/2011, 4:15 PM

## 2011-03-17 NOTE — Progress Notes (Signed)
Patient states urge to push

## 2011-03-17 NOTE — Anesthesia Procedure Notes (Signed)

## 2011-03-17 NOTE — H&P (Addendum)
Subjective:  Danielle Chandler is a 26 y.o. G2 P1 female with [facility] 04/07/2011 at 37 and 0/[redacted] weeks gestation who is being admitted for labor management.  Her current obstetrical history is significant for poor prenatal care. Patient was discharged from [facility] in November 2012 after missing several appointment and for failing to pay her bills. She started her prenatal care there at about 10 wks in July 2012. She is trying to get established at the [facility], and her first appointment is tomorrow. Patient reports backache, contractions since 0900 this morning and reports her water broke around that time as well. .   Fetal Movement: normal.    PMHx: History of Trichomoniasis (February 2009) History of PID (July 2011)  PObHx: Previous child delivered @ 39 wks. 7#13oz. 7 hours in labor.   Objective:   Vital signs in last 24 hours: Temp:  [97.7 F (36.5 C)] 97.7 F (36.5 C) (01/15 1120) Pulse Rate:  [63] 63  (01/15 1120) Resp:  [18] 18  (01/15 1120) BP: (125)/(67) 125/67 mmHg (01/15 1120)   General:   alert and severe distress  Skin:   healed scar on face  HEENT:    Lungs:   no increased work of breathing  Heart:     Breasts:     Abdomen:  gravid  Pelvis:  vulva is bloody; tried to perform speculum exam but could not visualize anatomy due to blood  FHT: 160 BPM  Uterine Size:   Presentations: cephalic  Cervix:    Dilation: 4cm   Effacement: 80%   Station:     Consistency: soft   Position:    Lab Review  A, Rh+, Rubella-immune, Hepatitis B surface antigen non-reactive  GBS unknown (was not checked at 36 wks)  One hour GTT: not done   Anatomic U/S @ 18 wks showed posterior previa. She was to get follow-up U/S @ 28 wks.   Assessment/Plan:  37 and 0/[redacted] weeks gestation. Active phase labor. Obstetrical history significant for poor prenatal care.     Risks, benefits, alternatives and possible complications have been discussed in detail with the patient.  Pre-admission,  admission, and post admission procedures and expectations were discussed in detail.  All questions answered, all appropriate consents will be signed at the Hospital. Admission is planned for today.  Expectant management., Anticipate vaginal delivery. and Analgesia: IM/IV narcotic and epidural

## 2011-03-17 NOTE — Progress Notes (Signed)
Forbag ruptured AROM

## 2011-03-17 NOTE — Progress Notes (Signed)
Patient is here with c/o ctx q2-47m and srom at 9am. She states that the fluids was clear and asking for asking medicine. She reports good fetal movement.

## 2011-03-17 NOTE — Progress Notes (Signed)
Epidural catherter taken out per orders, blue tip intact

## 2011-03-17 NOTE — Progress Notes (Signed)
Lab at bedside to gather labwork

## 2011-03-17 NOTE — Progress Notes (Signed)
Patient thrashing about in the bed, rating contractions at 10/10 requesting epidural

## 2011-03-17 NOTE — Anesthesia Preprocedure Evaluation (Signed)

## 2011-03-18 MED ORDER — LORAZEPAM 1 MG PO TABS
1.0000 mg | ORAL_TABLET | Freq: Four times a day (QID) | ORAL | Status: DC | PRN
  Administered 2011-03-18: 1 mg via ORAL
  Filled 2011-03-18: qty 1

## 2011-03-18 NOTE — Progress Notes (Signed)
UR chart review completed.  

## 2011-03-18 NOTE — Progress Notes (Signed)
I have spoken with and examined the patient and agree with the PA-student's progress note.

## 2011-03-18 NOTE — Progress Notes (Signed)
Post Partum Day 1 Subjective: up ad lib, voiding, tolerating PO, + flatus and c/o headache and back pain which she rates at a 7/10 this AM. Patient is bottle feeding.   Objective: Blood pressure 133/80, pulse 57, temperature 98.6 F (37 C), temperature source Oral, resp. rate 20, last menstrual period 07/01/2010, SpO2 96.00%, unknown if currently breastfeeding.  Physical Exam:  General: alert, cooperative and no distress Lochia: appropriate Uterine Fundus: firm Incision: NA DVT Evaluation: No evidence of DVT seen on physical exam. Negative Homan's sign.   Basename 03/17/11 1225  HGB 12.5  HCT 37.0    Assessment/Plan: Plan for discharge tomorrow and Contraception . Pateient states that she asked to have her tubes tied.  I could not find the release for tubal ligation.  Will foillow up on this matter before discharge.    LOS: 1 day   Arthor Captain 03/18/2011, 7:41 AM

## 2011-03-18 NOTE — Anesthesia Postprocedure Evaluation (Signed)
  Anesthesia Post-op Note  Patient: Danielle Chandler  Procedure(s) Performed: * No procedures listed *  Patient Location: Mother/Baby  Anesthesia Type: Epidural  Level of Consciousness: awake, alert  and oriented  Airway and Oxygen Therapy: Patient Spontanous Breathing  Post-op Pain: mild  Post-op Assessment: Patient's Cardiovascular Status Stable, Respiratory Function Stable, Patent Airway, No signs of Nausea or vomiting and Pain level controlled  Post-op Vital Signs: stable  Complications: No apparent anesthesia complications

## 2011-03-18 NOTE — ED Provider Notes (Signed)
Agree with above note.  Danielle Chandler H. 03/18/2011 7:57 AM  

## 2011-03-18 NOTE — Progress Notes (Signed)
Patient ID: Danielle Chandler, female   DOB: [birth year], 26 y.o.   MRN: [id number] Pt is distraught as her brother is armed and threatening suicide and has been here. [place] police and hospital security are involved. Requesting anti-anxiety med. Will order Ativan. SS to see pt. Discharge tomorrow am.   Isaia Hassell 03/18/2011   2:04 PM

## 2011-03-18 NOTE — H&P (Signed)
I have seen and examined this patient and I agree with the above. Cam Hai 2:59 AM 03/18/2011

## 2011-03-19 MED ORDER — INFLUENZA VIRUS VACC SPLIT PF IM SUSP
0.5000 mL | Freq: Once | INTRAMUSCULAR | Status: AC
  Administered 2011-03-19: 0.5 mL via INTRAMUSCULAR
  Filled 2011-03-19: qty 0.5

## 2011-03-19 MED ORDER — DROSPIRENONE-ETHINYL ESTRADIOL 3-0.02 MG PO TABS: 1.0000 | ORAL_TABLET | Freq: Every day | ORAL | Status: DC

## 2011-03-19 MED ORDER — OXYCODONE-ACETAMINOPHEN 5-325 MG PO TABS: 1.0000 | ORAL_TABLET | Freq: Four times a day (QID) | ORAL | Status: AC | PRN

## 2011-03-19 NOTE — Discharge Summary (Signed)
  Obstetric Discharge Summary Reason for Admission: onset of labor Prenatal Procedures: CST Intrapartum Procedures: spontaneous vaginal delivery Postpartum Procedures: none Complications-Operative and Postpartum: none Hemoglobin  Date Value Range Status  03/17/2011 12.5  12.0-15.0 (g/dL) Final     HCT  Date Value Range Status  03/17/2011 37.0  36.0-46.0 (%) Final    Discharge Diagnoses: Term Pregnancy-delivered  Discharge Information: Date: 03/19/2011 Activity: pelvic rest Diet: routine Medications: PNV and Ibuprofen Condition: stable Instructions: refer to practice specific booklet Discharge to: home   Newborn Data: Live born female  Birth Weight: 6 lb 4.2 oz (2841 g) APGAR: 9, 9  Home with mother.  OH PARK, Melinda Pottinger 03/19/2011, 2:07 PM

## 2011-03-19 NOTE — Progress Notes (Signed)
I discussed with the patient regarding the risks and benefits of having a circumcision done for her newborn son, including poor cosmetic result, injury to glans or urethra, reaction to medication, bleeding.  Questions answered.  Consent signed and on the chart.  Candelaria Celeste JEHIEL 03/19/2011 11:12 AM

## 2011-03-19 NOTE — Progress Notes (Signed)
Post Partum Day 2 Subjective: no complaints, up ad lib, voiding and tolerating PO  Objective: Blood pressure 130/80, pulse 58, temperature 97.8 F (36.6 C), temperature source Oral, resp. rate 20, last menstrual period 07/01/2010, SpO2 96.00%, unknown if currently breastfeeding.  Physical Exam:  General: alert and no distress Lochia: appropriate Uterine Fundus: firm Incision:  DVT Evaluation: No evidence of DVT seen on physical exam. No cords or calf tenderness. No significant calf/ankle edema.   Basename 03/17/11 1225  HGB 12.5  HCT 37.0    Assessment/Plan: Discharge home Bottle feeding only.  Her infant's circumcision was done this morning.  Contraception: the patient is interested in a bilateral tubal ligation. Papers were signed during admission. She will have to wait at least 6 weeks before this can be scheduled, per protocol. Yaz in the meantime.  SW was consulted due to the patient's limited prenatal care and recent narcotic use for laboring pains (she had received a small amount of narcotic in February for pain from facial surgery). SW did not find any SW needs.   LOS: 2 days   OH Chandler, Danielle Hoar 03/19/2011, 2:01 PM

## 2011-03-19 NOTE — Progress Notes (Signed)
PSYCHOSOCIAL ASSESSMENT ~ MATERNAL/CHILD  Name: Danielle Chandler Age: 26  Referral Date: 01 /17/13  Reason/Source: Limited PNC / CN  I. FAMILY/HOME ENVIRONMENT  A. Child's Legal Guardian _X__Parent(s) ___Grandparent ___Foster parent ___DSS_________________  Name: Danielle Chandler DOB: // Age: 25  Address: [address] ; [place], [place] [address]  Name: Danielle Chandler DOB: // Age: 29  Address: (same as above)  B. Other Household Members/Support Persons Name: Danielle Chandler Relationship: son DOB 01/09  Name: Relationship: DOB ___/___/___  Name: Relationship: DOB ___/___/___  Name: Relationship: DOB ___/___/___  C. Other Support:  II. PSYCHOSOCIAL DATA A. Information Source _X_Patient Interview __Family Interview __Other___________ B. Financial and Community Resources __Employment:  _X_Medicaid County: [place] __Private Insurance: __Self Pay  _X_Food Stamps _X_WIC __Work First __Public Housing __Section 8  __Maternity Care Coordination/Child Service Coordination/Early Intervention  ___School: Grade:  __Other:  C. Cultural and Environment Information Cultural Issues Impacting Care:  III. STRENGTHS _X__Supportive family/friends  _X__Adequate Resources  ___Compliance with medical plan  _X__Home prepared for Child (including basic supplies)  ___Understanding of illness  ___Other:  RISK FACTORS AND CURRENT PROBLEMS ____No Problems Noted  NPNC after 24 weeks  IV. SOCIAL WORK ASSESSMENT Sw met with 25 year old, G2P2 to assess her reason for NPNC after 24 weeks of pregnancy. Pt explained that she started PNC at 2 weeks with [facility] and attended her appointments regularly, but after missing 1 appointment, the practice dismissed her. Pt told Sw that she was upset about being dismissed, as she was a pt of that office for 4 years. According to the pt, she called and scheduled an appointment with the clinic but delivered the baby before she could attend the appointment. She denies any  illegal substance use after hospital drug testing policy was explained. UDS is positive for opiates, meconium results are pending. Pt was bitten by a dog at 9 years old which required multiple facial reconstructive surgeries. Pt's last surgery was performed last year and she was prescribed Percocet by Dr. Argenta. Pt told Sw that she took 2 percocet, for pain, the morning she came into the hospital. She denies regular use of the percocet during the pregnancy. Pt identified the FOB and her father, as her primary support system. FOB is currently incarcerated but is expected to be released today. Pt reports that she has all the necessary supplies for the infant. Sw will follow up with drug screen results and make a referral if needed.  V. SOCIAL WORK PLAN _X__No Further Intervention Required/No Barriers to Discharge  ___Psychosocial Support and Ongoing Assessment of Needs  ___Patient/Family Education:  ___Child Protective Services Report County___________ Date___/____/____  ___Information/Referral to Community Resources_________________________  ___Other:     _X_Patient Interview  __Family Interview           __Other___________  B. Surveyor, [employer] and [employer] __Employment: _X_Medicaid    [place]: [place]                __Private Insurance:                   __Self Pay  _X_Food Stamps   _X_WIC __Work First     __Public Housing     __Section 8    __Maternity Care Coordination/Child Service Coordination/Early Intervention   ___School:                                                                         Grade:  __Other:   Danielle Chandler Cultural and Environment Information Cultural Issues Impacting Care:  III. STRENGTHS _X__Supportive family/friends _X__Adequate Resources ___Compliance with medical plan _X__Home prepared for Child  (including basic supplies) ___Understanding of illness      ___Other: RISK FACTORS AND CURRENT PROBLEMS         ____No Problems Noted       NPNC after 24 weeks                                                                                                                                                                                                                                               IV. SOCIAL WORK ASSESSMENT  Sw met with 55 year old, G2P2 to assess her reason for [facility] after 24 weeks of pregnancy.  Pt explained that she started [facility] at 2 weeks with [facility] and attended her appointments regularly, but after missing 1 appointment, the practice dismissed her.  Pt told Sw that she was upset about being dismissed, as she was a pt of that office for 4 years.  According to the pt, she called and scheduled an appointment with the clinic but delivered the baby before she could attend the appointment.  She denies any illegal substance use after hospital drug testing policy was explained.  UDS is positive for opiates, meconium results are pending. Pt was bitten  by a dog at 68 years old which required multiple facial reconstructive surgeries.  Pt's last surgery was performed last year and she was prescribed Percocet by Dr. Ferd Hibbs.  Pt told Sw that she took 2 percocet, for pain, the morning she came into the hospital.  She denies regular use of the percocet during the pregnancy.  Pt identified the FOB and her father, as her primary support system.  FOB is currently incarcerated but is expected to be released today.  Pt reports that she has all the necessary supplies for the infant.  Sw will follow up with drug screen results and make a referral if needed.    V. SOCIAL WORK PLAN  _X__No Further Intervention Required/No Barriers to Discharge   ___Psychosocial Support and Ongoing Assessment of Needs   ___Patient/Family Education:   ___Child Protective Services Report   County___________  Date___/____/____   ___Information/Referral to [employer]_________________________   ___Other:

## 2011-03-24 NOTE — Discharge Summary (Signed)
Agree with above note.  Ember Gottwald H. 03/24/2011 3:37 PM

## 2011-03-25 NOTE — H&P (Signed)
Agree with note. 

## 2011-04-08 ENCOUNTER — Ambulatory Visit: Payer: Medicaid Other | Admitting: Obstetrics & Gynecology

## 2011-04-10 ENCOUNTER — Inpatient Hospital Stay (HOSPITAL_COMMUNITY)
Admission: AD | Admit: 2011-04-10 | Discharge: 2011-04-10 | Disposition: A | Discharge status: Home / Self Care | Payer: Medicaid Other | Source: Ambulatory Visit | Attending: Obstetrics and Gynecology | Admitting: Obstetrics and Gynecology

## 2011-04-10 ENCOUNTER — Encounter (HOSPITAL_COMMUNITY): Payer: Self-pay

## 2011-04-10 ENCOUNTER — Inpatient Hospital Stay (HOSPITAL_COMMUNITY): Payer: Medicaid Other

## 2011-04-10 DIAGNOSIS — O8612 Endometritis following delivery: Secondary | ICD-10-CM | POA: Insufficient documentation

## 2011-04-10 LAB — WET PREP, GENITAL
Clue Cells Wet Prep HPF POC: NONE SEEN
Yeast Wet Prep HPF POC: NONE SEEN

## 2011-04-10 LAB — DIFFERENTIAL
Basophils Absolute: 0 10*3/uL (ref 0.0–0.1)
Basophils Relative: 1 % (ref 0–1)
Eosinophils Relative: 10 % — ABNORMAL HIGH (ref 0–5)
Monocytes Absolute: 0.4 10*3/uL (ref 0.1–1.0)
Neutro Abs: 3.2 10*3/uL (ref 1.7–7.7)

## 2011-04-10 LAB — CBC
HCT: 38.1 % (ref 36.0–46.0)
MCHC: 32.3 g/dL (ref 30.0–36.0)
RDW: 13.5 % (ref 11.5–15.5)

## 2011-04-10 MED ORDER — OXYCODONE-ACETAMINOPHEN 5-325 MG PO TABS
2.0000 | ORAL_TABLET | Freq: Once | ORAL | Status: AC
  Administered 2011-04-10: 2 via ORAL
  Filled 2011-04-10: qty 2

## 2011-04-10 MED ORDER — ALBUTEROL SULFATE HFA 108 (90 BASE) MCG/ACT IN AERS: 2.0000 | INHALATION_SPRAY | RESPIRATORY_TRACT | Status: DC | PRN

## 2011-04-10 MED ORDER — AMOXICILLIN-POT CLAVULANATE 875-125 MG PO TABS: 1.0000 | ORAL_TABLET | Freq: Two times a day (BID) | ORAL | Status: AC

## 2011-04-10 MED ORDER — IBUPROFEN 600 MG PO TABS: 600.0000 mg | ORAL_TABLET | Freq: Four times a day (QID) | ORAL | Status: AC | PRN

## 2011-04-10 NOTE — Progress Notes (Signed)
Pt states, " I started having heavy bleeding last night and today it is really bad. I am passing golf ball sizer clots; I am very weak and I'm seeing spots. I have sharp pain in my vagina like someone had a knife and cutting inside of my vagina."

## 2011-04-10 NOTE — ED Notes (Signed)
Dr. Adrian Blackwater notified pt in [facility]U post vaginal delivery [phone]. Passing golf ball sized clots today after bleeding had ceased. Dr. Adrian Blackwater to place order for u/s to r/o retained products, and RN will call back when pt returns.

## 2011-04-10 NOTE — Progress Notes (Signed)
Pt states she feels weak, had vaginal delivery 03/17/2011. Wearing newborn diaper for golf ball sized clots noted.

## 2011-04-10 NOTE — ED Provider Notes (Signed)
History     Chief Complaint  Patient presents with  . Vaginal Bleeding   HPI This is a 26 year old G2P2 who is approximately 2 weeks postpartum from a vaginal delivery who started having bleeding and severe left lower abdominal pain that started last night and has continued throughout the day today.  She denies fevers, nausea, vomiting, diarrhea, constipation, foul smelling discharge.  She admits to decreased appetite.  She has been passing several large golf-ball sized clots.  No provoking factors.  Percocet helped with the discomfort.  She denies sexual activity.  OB History    Grav Para Term Preterm Abortions TAB SAB Ect Mult Living   2 2 2       2       Past Medical History  Diagnosis Date  . Headache   . Anxiety     Past Surgical History  Procedure Date  . Cholecystectomy   . Facial reconstruction surgery   . Cholecystectomy   . Ovarian cyst removal     Family History  Problem Relation Age of Onset  . Diabetes Maternal Aunt   . Heart disease Maternal Aunt   . Diabetes Maternal Grandmother   . Diabetes Maternal Grandfather   . Heart disease Maternal Grandfather   . Anesthesia problems Neg Hx     History  Substance Use Topics  . Smoking status: Never Smoker   . Smokeless tobacco: Never Used  . Alcohol Use: No    Allergies:  Allergies  Allergen Reactions  . Nubain (Nalbuphine Hcl) Other (See Comments)    Patient said it made hurt really badly    Prescriptions prior to admission  Medication Sig Dispense Refill  . albuterol (PROVENTIL HFA;VENTOLIN HFA) 108 (90 BASE) MCG/ACT inhaler Inhale 2 puffs into the lungs every 6 (six) hours as needed. Shortness of breathe      . drospirenone-ethinyl estradiol (YAZ) 3-0.02 MG tablet Take 1 tablet by mouth daily.  1 Package  11  . Multiple Vitamins-Minerals (MULTIVITAMIN WITH MINERALS) tablet Take 1 tablet by mouth daily.      . ondansetron (ZOFRAN-ODT) 8 MG disintegrating tablet Take 1 tablet (8 mg total) by mouth every  8 (eight) hours as needed. For nausea  20 tablet  2    Review of Systems  All other systems reviewed and are negative.   Physical Exam   Blood pressure 122/84, pulse 88, temperature 99.1 F (37.3 C), temperature source Oral, resp. rate 20, height 5\' 2"  (1.575 m), weight 82.668 kg (182 lb 4 oz), SpO2 97.00%, not currently breastfeeding.  Physical Exam  Constitutional: She is oriented to person, place, and time. She appears well-developed and well-nourished.  GI: Soft. There is tenderness. There is guarding.       Left lower quadrant and pelvic pain.  Genitourinary:       Blood seen on exam.  CMT with exam.  Uterus moderately firm and approximately 12 week size, deviated to left.  Neurological: She is alert and oriented to person, place, and time.  Skin: Skin is warm and dry. No rash noted. No erythema. No pallor.  Psychiatric: She has a normal mood and affect. Her behavior is normal. Judgment and thought content normal.   Ultrasound - no retained products seen. Wet prep negative  Lab Results  Component Value Date   WBC 8.1 04/10/2011   HGB 12.3 04/10/2011   HCT 38.1 04/10/2011   MCV 90.3 04/10/2011   PLT 271 04/10/2011     MAU Course  Procedures  MDM Likely endometritis.  With no fever or Wbc and negative wet prep, will treat outpatient with Augmentin.  Assessment and Plan  1.  Postpartum endometritis  Augmentin 875mg  bid x 7days.  Will also give motrin for pain relief.  Counseled patient on symptoms to return.  Patient to follow up at 6 week postpartum visit.  STINSON, JACOB JEHIEL 04/10/2011, 8:21 PM

## 2011-04-11 LAB — GC/CHLAMYDIA PROBE AMP, GENITAL: Chlamydia, DNA Probe: NEGATIVE

## 2011-04-18 ENCOUNTER — Emergency Department (HOSPITAL_COMMUNITY)
Admission: EM | Admit: 2011-04-18 | Discharge: 2011-04-18 | Disposition: A | Discharge status: Home / Self Care | Payer: Medicaid Other | Attending: Emergency Medicine | Admitting: Emergency Medicine

## 2011-04-18 ENCOUNTER — Encounter (HOSPITAL_COMMUNITY): Payer: Self-pay | Admitting: *Deleted

## 2011-04-18 DIAGNOSIS — F411 Generalized anxiety disorder: Secondary | ICD-10-CM | POA: Insufficient documentation

## 2011-04-18 DIAGNOSIS — S8392XA Sprain of unspecified site of left knee, initial encounter: Secondary | ICD-10-CM

## 2011-04-18 DIAGNOSIS — R21 Rash and other nonspecific skin eruption: Secondary | ICD-10-CM

## 2011-04-18 DIAGNOSIS — X500XXA Overexertion from strenuous movement or load, initial encounter: Secondary | ICD-10-CM | POA: Insufficient documentation

## 2011-04-18 DIAGNOSIS — Z79899 Other long term (current) drug therapy: Secondary | ICD-10-CM | POA: Insufficient documentation

## 2011-04-18 DIAGNOSIS — IMO0002 Reserved for concepts with insufficient information to code with codable children: Secondary | ICD-10-CM | POA: Insufficient documentation

## 2011-04-18 DIAGNOSIS — M25569 Pain in unspecified knee: Secondary | ICD-10-CM | POA: Insufficient documentation

## 2011-04-18 DIAGNOSIS — M25469 Effusion, unspecified knee: Secondary | ICD-10-CM | POA: Insufficient documentation

## 2011-04-18 MED ORDER — DIPHENHYDRAMINE HCL 25 MG PO TABS: 25.0000 mg | ORAL_TABLET | Freq: Four times a day (QID) | ORAL | Status: DC

## 2011-04-18 MED ORDER — PREDNISONE 10 MG PO TABS: 40.0000 mg | ORAL_TABLET | Freq: Every day | ORAL | Status: DC

## 2011-04-18 MED ORDER — FAMOTIDINE 20 MG PO TABS: 20.0000 mg | ORAL_TABLET | Freq: Two times a day (BID) | ORAL | Status: DC

## 2011-04-18 MED ORDER — PERMETHRIN 5 % EX CREA: TOPICAL_CREAM | CUTANEOUS | Status: AC

## 2011-04-18 NOTE — ED Notes (Signed)
approx four days ago patient thought she had a scratch left leg, spread, itching, now also on left arm. Warm, and painful

## 2011-04-18 NOTE — ED Provider Notes (Signed)
History     CSN: [id number]  Arrival date & time 04/18/11  1252   First MD Initiated Contact with Patient 04/18/11 1304      Chief Complaint  Patient presents with  . Rash    arms, legs, itching  . Knee Pain    left    (Consider location/radiation/quality/duration/timing/severity/associated sxs/prior treatment) Patient is a 26 y.o. female presenting with rash and knee pain. The history is provided by the patient.  Rash  This is a new problem. The current episode started more than 2 days ago. The problem has been gradually worsening. The problem is associated with an unknown factor. There has been no fever. The rash is present on the left upper leg, left lower leg, right upper leg, right lower leg, right ankle, left ankle, groin, back, left arm, right arm, left hand and right hand. Associated symptoms include itching. She has tried nothing for the symptoms.  Knee Pain This is a new problem. The current episode started 1 to 4 weeks ago. The problem occurs constantly. The problem has been gradually worsening. Associated symptoms include arthralgias, joint swelling and a rash. Pertinent negatives include no chills, congestion, fever or sore throat. The symptoms are aggravated by twisting, walking and standing. She has tried NSAIDs for the symptoms.  Pt states rash just started few days ago. No new products, states no one in family has the same problem.  Knee pain began after she twisted it walking few weeks ago. Pain and swelling since then. Pt states she had percocets she has been taking that she was prescribed for delivery of her child few months ago, but ran out. Taking ibuprofen and voltaren gel with no relief.   Past Medical History  Diagnosis Date  . Headache   . Anxiety     Past Surgical History  Procedure Date  . Cholecystectomy   . Facial reconstruction surgery   . Cholecystectomy   . Ovarian cyst removal     Family History  Problem Relation Age of Onset  . Diabetes  Maternal Aunt   . Heart disease Maternal Aunt   . Diabetes Maternal Grandmother   . Diabetes Maternal Grandfather   . Heart disease Maternal Grandfather   . Anesthesia problems Neg Hx     History  Substance Use Topics  . Smoking status: Never Smoker   . Smokeless tobacco: Never Used  . Alcohol Use: No    OB History    Grav Para Term Preterm Abortions TAB SAB Ect Mult Living   2 2 2       2       Review of Systems  Constitutional: Negative for fever and chills.  HENT: Negative.  Negative for congestion, sore throat and mouth sores.   Eyes: Negative.   Respiratory: Negative.   Cardiovascular: Negative.   Gastrointestinal: Negative.   Genitourinary: Negative.   Musculoskeletal: Positive for joint swelling and arthralgias.  Skin: Positive for itching and rash.  Neurological: Negative.   Psychiatric/Behavioral: Negative.     Allergies  Nubain  Home Medications   Current Outpatient Rx  Name Route Sig Dispense Refill  . ALBUTEROL SULFATE HFA 108 (90 BASE) MCG/ACT IN AERS Inhalation Inhale 2 puffs into the lungs every 4 (four) hours as needed for wheezing or shortness of breath. Shortness of breathe 1 Inhaler 0  . AMOXICILLIN-POT CLAVULANATE 875-125 MG PO TABS Oral Take 1 tablet by mouth 2 (two) times daily. 14 tablet 0  . DROSPIRENONE-ETHINYL ESTRADIOL 3-0.02 MG PO TABS Oral  Take 1 tablet by mouth daily. 1 Package 11  . IBUPROFEN 600 MG PO TABS Oral Take 1 tablet (600 mg total) by mouth every 6 (six) hours as needed for pain. 30 tablet 0  . MULTI-VITAMIN/MINERALS PO TABS Oral Take 1 tablet by mouth daily.    Marland Kitchen ONDANSETRON 8 MG PO TBDP Oral Take 1 tablet (8 mg total) by mouth every 8 (eight) hours as needed. For nausea 20 tablet 2    BP 101/52  Pulse 89  Temp(Src) 97.4 F (36.3 C) (Oral)  Resp 16  SpO2 98%  Breastfeeding? No  Physical Exam  Nursing note and vitals reviewed. Constitutional: She is oriented to person, place, and time. She appears well-developed and  well-nourished. No distress.  HENT:  Head: Normocephalic and atraumatic.  Eyes: Conjunctivae are normal.  Neck: Neck supple.  Cardiovascular: Normal rate, regular rhythm and normal heart sounds.   Pulmonary/Chest: Effort normal and breath sounds normal. No respiratory distress.  Musculoskeletal: Normal range of motion.       Left knee appears swollen. Full ROM. Negative anterior posterior drawer signs. Patella tendon intact. Tender to palpation over entire knee. No erythema, no warmth with palpation  Neurological: She is alert and oriented to person, place, and time.  Skin: Skin is warm and dry. Rash noted.       erythemous papular rash in linear patter over bilateral legs, arms, groin, ankles, wrists, hands, webs of finger  Psychiatric: She has a normal mood and affect.    ED Course  Procedures (including critical care time)  Pt with two separate complaints. One is rash, which resembles in appearance scabies, however, no one in the family has the same. Will cover for possible allergic reaction or scabies. 2nd is left knee sprain. Joint is stable. She has a knee sleeve, NSAIDs, will instruct to ice, refer to orthopedics  No diagnosis found.    MDM           Lottie Mussel, PA 04/18/11 1331

## 2011-04-18 NOTE — Discharge Instructions (Signed)
Your rash is either due to an allergic reaction or possibly scabies. Start benadryl, pepcid, prednisone for itching. Try permethrin cream as prescribed. Wash all your clothing and bedsheets in hot water. Keep your knee elevated, ice it multiple times a day, keep knee sleeve on. Take ibuprofen for pain. Follow up with orthopedics specialist if not improving.   Joint Sprain A sprain is a tear or stretch in the ligaments that hold a joint together. Severe sprains may need as long as 3-6 weeks of immobilization and/or exercises to heal completely. Sprained joints should be rested and protected. If not, they can become unstable and prone to re-injury. Proper treatment can reduce your pain, shorten the period of disability, and reduce the risk of repeated injuries. TREATMENT   Rest and elevate the injured joint to reduce pain and swelling.   Apply ice packs to the injury for 20-30 minutes every 2-3 hours for the next 2-3 days.   Keep the injury wrapped in a compression bandage or splint as long as the joint is painful or as instructed by your caregiver.   Do not use the injured joint until it is completely healed to prevent re-injury and chronic instability. Follow the instructions of your caregiver.   Long-term sprain management may require exercises and/or treatment by a physical therapist. Taping or special braces may help stabilize the joint until it is completely better.  SEEK MEDICAL CARE IF:   You develop increased pain or swelling of the joint.   You develop increasing redness and warmth of the joint.   You develop a fever.   It becomes stiff.   Your hand or foot gets cold or numb.  Document Released: 03/26/2004 Document Revised: 10/29/2010 Document Reviewed: 03/05/2008 [facility] Patient Information 2012 [place], [place].

## 2011-04-19 NOTE — ED Provider Notes (Signed)
Medical screening examination/treatment/procedure(s) were performed by non-physician practitioner and as supervising physician I was immediately available for consultation/collaboration. Catrice Zuleta Y.   Gavin Pound. Anoop Hemmer, MD 04/19/11 1056

## 2011-04-29 ENCOUNTER — Ambulatory Visit: Payer: Medicaid Other | Admitting: Obstetrics and Gynecology

## 2011-09-02 ENCOUNTER — Encounter (HOSPITAL_COMMUNITY): Payer: Self-pay | Admitting: Emergency Medicine

## 2011-09-02 ENCOUNTER — Emergency Department (HOSPITAL_COMMUNITY): Payer: Medicaid Other

## 2011-09-02 ENCOUNTER — Emergency Department (HOSPITAL_COMMUNITY)
Admission: EM | Admit: 2011-09-02 | Discharge: 2011-09-02 | Disposition: A | Discharge status: Home / Self Care | Payer: Medicaid Other | Attending: Emergency Medicine | Admitting: Emergency Medicine

## 2011-09-02 DIAGNOSIS — L0390 Cellulitis, unspecified: Secondary | ICD-10-CM

## 2011-09-02 DIAGNOSIS — Z79899 Other long term (current) drug therapy: Secondary | ICD-10-CM | POA: Insufficient documentation

## 2011-09-02 DIAGNOSIS — IMO0002 Reserved for concepts with insufficient information to code with codable children: Secondary | ICD-10-CM | POA: Insufficient documentation

## 2011-09-02 DIAGNOSIS — F1110 Opioid abuse, uncomplicated: Secondary | ICD-10-CM | POA: Insufficient documentation

## 2011-09-02 DIAGNOSIS — Z9089 Acquired absence of other organs: Secondary | ICD-10-CM | POA: Insufficient documentation

## 2011-09-02 DIAGNOSIS — M7989 Other specified soft tissue disorders: Secondary | ICD-10-CM

## 2011-09-02 DIAGNOSIS — F411 Generalized anxiety disorder: Secondary | ICD-10-CM | POA: Insufficient documentation

## 2011-09-02 DIAGNOSIS — F1910 Other psychoactive substance abuse, uncomplicated: Secondary | ICD-10-CM

## 2011-09-02 HISTORY — DX: Other psychoactive substance use, unspecified, uncomplicated: F19.90

## 2011-09-02 LAB — CBC
HCT: 38.9 % (ref 36.0–46.0)
MCH: 28.7 pg (ref 26.0–34.0)
MCV: 87.8 fL (ref 78.0–100.0)
Platelets: 333 10*3/uL (ref 150–400)
RBC: 4.43 MIL/uL (ref 3.87–5.11)
WBC: 8.4 10*3/uL (ref 4.0–10.5)

## 2011-09-02 LAB — COMPREHENSIVE METABOLIC PANEL
AST: 26 U/L (ref 0–37)
BUN: 8 mg/dL (ref 6–23)
CO2: 27 mEq/L (ref 19–32)
Calcium: 9.6 mg/dL (ref 8.4–10.5)
Chloride: 101 mEq/L (ref 96–112)
Creatinine, Ser: 0.69 mg/dL (ref 0.50–1.10)
GFR calc Af Amer: >90 mL/min (ref >90)
GFR calc non Af Amer: >90 mL/min (ref >90)
Total Bilirubin: 0.7 mg/dL (ref 0.3–1.2)

## 2011-09-02 LAB — RAPID URINE DRUG SCREEN, HOSP PERFORMED
Cocaine: NOT DETECTED
Opiates: POSITIVE — AB

## 2011-09-02 LAB — ETHANOL: Alcohol, Ethyl (B): <11 mg/dL (ref 0–11)

## 2011-09-02 MED ORDER — NICOTINE 21 MG/24HR TD PT24
21.0000 mg | MEDICATED_PATCH | Freq: Every day | TRANSDERMAL | Status: DC
  Filled 2011-09-02: qty 1

## 2011-09-02 MED ORDER — LORAZEPAM 1 MG PO TABS
1.0000 mg | ORAL_TABLET | Freq: Three times a day (TID) | ORAL | Status: DC | PRN
  Administered 2011-09-02: 1 mg via ORAL
  Filled 2011-09-02: qty 1

## 2011-09-02 MED ORDER — CEFAZOLIN SODIUM 1-5 GM-% IV SOLN
1.0000 g | Freq: Once | INTRAVENOUS | Status: AC
  Administered 2011-09-02: 1 g via INTRAVENOUS
  Filled 2011-09-02: qty 50

## 2011-09-02 MED ORDER — AMOXICILLIN-POT CLAVULANATE 875-125 MG PO TABS
1.0000 | ORAL_TABLET | Freq: Two times a day (BID) | ORAL | Status: DC
  Administered 2011-09-02: 1 via ORAL
  Filled 2011-09-02: qty 1

## 2011-09-02 MED ORDER — MORPHINE SULFATE 4 MG/ML IJ SOLN
4.0000 mg | Freq: Once | INTRAMUSCULAR | Status: AC
  Administered 2011-09-02: 4 mg via INTRAVENOUS
  Filled 2011-09-02: qty 1

## 2011-09-02 MED ORDER — ACETAMINOPHEN 325 MG PO TABS
650.0000 mg | ORAL_TABLET | ORAL | Status: DC | PRN
  Administered 2011-09-02: 650 mg via ORAL
  Filled 2011-09-02: qty 2

## 2011-09-02 MED ORDER — IBUPROFEN 600 MG PO TABS: 600.0000 mg | ORAL_TABLET | Freq: Three times a day (TID) | ORAL | Status: DC | PRN

## 2011-09-02 MED ORDER — AMOXICILLIN-POT CLAVULANATE 875-125 MG PO TABS: 1.0000 | ORAL_TABLET | Freq: Two times a day (BID) | ORAL | Status: AC

## 2011-09-02 NOTE — ED Notes (Signed)
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  Last used two days ago.  Has swelling in left hand and forearm from use.  Pt denies SI/HI but while using phone, stated she wished she was dead.  When I asked her about this, she stated she does not really wish she was dead but was only saying that.

## 2011-09-02 NOTE — ED Notes (Signed)
Pt has been wanded.  Pt's belongings will be in locker #41 in psych ED.  Pt's valuables: necklace, bracelet, wedding ring are being sent home with her dad.

## 2011-09-02 NOTE — ED Notes (Signed)
Pt has swollen hand and arms from needle puncture. Pt was using heroin.

## 2011-09-02 NOTE — ED Notes (Signed)
MD at bedside. 

## 2011-09-02 NOTE — ED Notes (Signed)
Patient up at desk using telephone, speaking to her incarcerated husband. She is visibly upset, said "I wish I was dead. I wish I was dead." RN Kathlene November notified.

## 2011-09-02 NOTE — Progress Notes (Signed)
*  Preliminary Results* Left upper extremity venous duplex completed. Left upper extremity is negative for deep vein thrombosis.  09/02/2011 1:17 PM  Gertie Fey, RDMS,RDCS

## 2011-09-02 NOTE — ED Provider Notes (Addendum)
History     CSN: [id number]  Arrival date & time 09/02/11  1129   First MD Initiated Contact with Patient 09/02/11 1151      Chief Complaint  Patient presents with  . Joint Swelling    (Consider location/radiation/quality/duration/timing/severity/associated sxs/prior treatment) The history is provided by the patient.  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   C/o pain to dorsum right hand. Unsure if injured, states has injected heroine there before but not recently. Also c/o redness/soreness to left proximal forearm, antecubital area, as well as swelling to left forearm/arm. Has been injecting heroine there recently. Denies fever or chills. No hx mrsa. Denies numbness or loss of function. Also notes hx anxiety, takes xanax prn. Family states has abused xanax in past. Pt denies overdose. Denies depression or thoughts of self harm, but states is interested in pursuing detox/rehab program.    Past Medical History  Diagnosis Date  . Headache   . Anxiety   . Drug use     Past Surgical History  Procedure Date  . Cholecystectomy   . Facial reconstruction surgery   . Cholecystectomy   . Ovarian cyst removal     Family History  Problem Relation Age of Onset  . Diabetes Maternal Aunt   . Heart disease Maternal Aunt   . Diabetes Maternal Grandmother   . Diabetes Maternal Grandfather   . Heart disease Maternal Grandfather   . Anesthesia problems Neg Hx     History  Substance Use Topics  . Smoking status: Never Smoker   . Smokeless tobacco: Never Used  . Alcohol Use: No    OB History    Grav Para Term Preterm Abortions TAB SAB Ect Mult Living   2 2 2       2       Review of Systems  Constitutional: Negative for fever and chills.  HENT: Negative for neck pain.   Eyes: Negative for redness.  Respiratory: Negative for cough and shortness of breath.   Cardiovascular: Negative for chest pain and leg swelling.  Gastrointestinal: Negative for vomiting and abdominal pain.    Genitourinary: Negative for flank pain.  Musculoskeletal: Negative for back pain.  Skin: Negative for rash.  Neurological: Negative for headaches.  Hematological: Does not bruise/bleed easily.  Psychiatric/Behavioral: Negative for confusion.    Allergies  Nubain  Home Medications   Current Outpatient Rx  Name Route Sig Dispense Refill  . DIPHENHYDRAMINE HCL 25 MG PO TABS Oral Take 1 tablet (25 mg total) by mouth every 6 (six) hours. 20 tablet 0  . DROSPIRENONE-ETHINYL ESTRADIOL 3-0.02 MG PO TABS Oral Take 1 tablet by mouth daily. 1 Package 11  . FAMOTIDINE 20 MG PO TABS Oral Take 1 tablet (20 mg total) by mouth 2 (two) times daily. 30 tablet 0  . MULTI-VITAMIN/MINERALS PO TABS Oral Take 1 tablet by mouth daily.    Marland Kitchen ONDANSETRON 8 MG PO TBDP Oral Take 1 tablet (8 mg total) by mouth every 8 (eight) hours as needed. For nausea 20 tablet 2  . PREDNISONE 10 MG PO TABS Oral Take 4 tablets (40 mg total) by mouth daily. 20 tablet 0    BP 108/70  Pulse 103  Temp 98.8 F (37.1 C) (Oral)  Resp 18  SpO2 98%  LMP 08/03/2011  Physical Exam  Nursing note and vitals reviewed. Constitutional: She is oriented to person, place, and time. She appears well-developed and well-nourished. No distress.  HENT:  Nose: Nose normal.  Mouth/Throat: Oropharynx is clear  and moist.  Eyes: Conjunctivae are normal. No scleral icterus.  Neck: Neck supple. No tracheal deviation present.  Cardiovascular: Normal rate, regular rhythm, normal heart sounds and intact distal pulses.   Pulmonary/Chest: Effort normal and breath sounds normal. No respiratory distress.  Abdominal: Soft. Normal appearance and bowel sounds are normal. She exhibits no distension. There is no tenderness.  Musculoskeletal:       Mild sts and bruising to dorsum right hand. Tenderness 4th and 5th mt. Normal rom digits. No tenosynovitis. No erythema or increased warmth. Normal cap refill distally. ?tiny scab/prior ivda injection site left  antecubital area, w erythema extending to proximal left forearm. Mild swelling noted. Radial pulse 2+. Hand nvi, w intact r/m/u function. No epitroc/axilla l/a. No abscess/fluctuance. No erythematous streaking.  No crepitus to either area.   Neurological: She is alert and oriented to person, place, and time.  Skin: Skin is warm and dry. No rash noted.  Psychiatric:       Anxious.     ED Course  Procedures (including critical care time)   Labs Reviewed  CBC  COMPREHENSIVE METABOLIC PANEL  ETHANOL  URINE RAPID DRUG SCREEN (HOSP PERFORMED)   Results for orders placed during the hospital encounter of 09/02/11  CBC      Component Value Range   WBC 8.4  4.0 - 10.5 K/uL   RBC 4.43  3.87 - 5.11 MIL/uL   Hemoglobin 12.7  12.0 - 15.0 g/dL   HCT 16.1  09.6 - 04.5 %   MCV 87.8  78.0 - 100.0 fL   MCH 28.7  26.0 - 34.0 pg   MCHC 32.6  30.0 - 36.0 g/dL   RDW 40.9  81.1 - 91.4 %   Platelets 333  150 - 400 K/uL  COMPREHENSIVE METABOLIC PANEL      Component Value Range   Sodium 136  135 - 145 mEq/L   Potassium 4.2  3.5 - 5.1 mEq/L   Chloride 101  96 - 112 mEq/L   CO2 27  19 - 32 mEq/L   Glucose, Bld 93  70 - 99 mg/dL   BUN 8  6 - 23 mg/dL   Creatinine, Ser 7.82  0.50 - 1.10 mg/dL   Calcium 9.6  8.4 - 95.6 mg/dL   Total Protein 7.5  6.0 - 8.3 g/dL   Albumin 3.6  3.5 - 5.2 g/dL   AST 26  0 - 37 U/L   ALT 18  0 - 35 U/L   Alkaline Phosphatase 70  39 - 117 U/L   Total Bilirubin 0.7  0.3 - 1.2 mg/dL   GFR calc non Af Amer >90  >90 mL/min   GFR calc Af Amer >90  >90 mL/min  ETHANOL      Component Value Range   Alcohol, Ethyl (B) <11  0 - 11 mg/dL  URINE RAPID DRUG SCREEN (HOSP PERFORMED)      Component Value Range   Opiates POSITIVE (*) NONE DETECTED   Cocaine NONE DETECTED  NONE DETECTED   Benzodiazepines POSITIVE (*) NONE DETECTED   Amphetamines NONE DETECTED  NONE DETECTED   Tetrahydrocannabinol NONE DETECTED  NONE DETECTED   Barbiturates NONE DETECTED  NONE DETECTED   Dg Hand  Complete Right  09/02/2011  *RADIOLOGY REPORT*  Clinical Data: Posterior hand pain for 2 days.  Question fracture.  RIGHT HAND - COMPLETE 3+ VIEW  Comparison: None.  Findings: No fracture or dislocation is found.  Soft tissues about the dorsum of the  hand appear swollen.  IMPRESSION: Soft tissue swelling without underlying bony or joint abnormality.  Original Report Authenticated By: Bernadene Bell. D'ALESSIO, M.D.      MDM  Iv ns. Ancef iv. Labs.   On right, pain/swelling/bruising localized to dorsum hand. On left, given cellulitis, arm/forearm swelling, will get vascular duplex to rule out underlying dvt.    Vascular doppler neg for dvt, see below:   Author:  Lawrence Marseilles Simonetti  Service:  (none)  Author Type:  Cardiovascular Sonographer   Filed:  09/02/11 1317  Note Time:  09/02/11 1317          *Preliminary Results*  Left upper extremity venous duplex completed.  Left upper extremity is negative for deep vein thrombosis.  09/02/2011 1:17 PM  Gertie Fey, RDMS,RDCS         Pt moved to psych ed pending act eval/placement. Will start on augmentin  re cellullitis. Will need recheck in 1 days time.   Recheck pt calm and alert. No distress. Pt states is willing to stay for placement into detox/rehab, requests [facility], discussed w [facility] team.  If no beds available, or if decline at [facility], would plan d/c with resource guide/community resources, rx for cellulitis, and plan for recheck in 24 hrs.   Pt signed out to EDP covering psych ED.    Suzi Roots, MD 09/02/11 1349  Suzi Roots, MD 09/02/11 [phone]

## 2011-09-02 NOTE — ED Notes (Signed)
Pt says she wants to leave.  Advised Dr Hyacinth Meeker.  I explained to her that [facility] would be in to work with her on placement as soon as possible.  Still wants to leave.

## 2011-09-02 NOTE — ED Notes (Addendum)
Pt stated that swelling started yesterday after relapsed by snorting heroin, amount unknown. Pt stated no other substances used besides heroin. No complaints of n/v/d. Pt complaints of fatigue and anxiety. Pt took xanax 2 mg PO today before coming to ED today. Pt stated that she took her last Xanax today. Pt stated having problems with father, husband is in prison. Pt reports having stress and stated "I am doing everything on my own". Pt moved into new house recently. Pt has swelling on both arms and complaints of pain upon touch. Left AC has redness and swelling, pt stated that needle used 2 weeks to inject heroin. Denies any recent IV use after that.  Pt then stated she needs to be honest. She admitted to using IV heroin yesterday at 2230 last night in right arm. Pt stated that needle was new.

## 2011-09-02 NOTE — ED Notes (Signed)
She reevaluated at her request, states that she feels that the redness on her arm is persistent and her right hand and swelling. On my exam she does have some bruising around the metacarpophalangeal joint on the right hand, measuring shows no fractures, cellulitis outlined by myself with marking pen, patient has been given antibiotics, antibiotic offered as outpatient as patient refuses to stay for placement. At this time she has medical decision-making capacity, is not intoxicated and wants to be discharged home.  Vida Roller, MD 09/02/11 [phone]

## 2011-09-02 NOTE — ED Notes (Signed)
Family at bedside. 

## 2011-12-24 ENCOUNTER — Emergency Department (HOSPITAL_COMMUNITY)
Admission: EM | Admit: 2011-12-24 | Discharge: 2011-12-25 | Disposition: A | Discharge status: Home / Self Care | Payer: Medicaid Other | Attending: Emergency Medicine | Admitting: Emergency Medicine

## 2011-12-24 ENCOUNTER — Encounter (HOSPITAL_COMMUNITY): Payer: Self-pay | Admitting: Emergency Medicine

## 2011-12-24 DIAGNOSIS — F17200 Nicotine dependence, unspecified, uncomplicated: Secondary | ICD-10-CM | POA: Insufficient documentation

## 2011-12-24 DIAGNOSIS — Z9889 Other specified postprocedural states: Secondary | ICD-10-CM | POA: Insufficient documentation

## 2011-12-24 DIAGNOSIS — F411 Generalized anxiety disorder: Secondary | ICD-10-CM | POA: Insufficient documentation

## 2011-12-24 DIAGNOSIS — Z79899 Other long term (current) drug therapy: Secondary | ICD-10-CM | POA: Insufficient documentation

## 2011-12-24 DIAGNOSIS — Z9089 Acquired absence of other organs: Secondary | ICD-10-CM | POA: Insufficient documentation

## 2011-12-24 DIAGNOSIS — N949 Unspecified condition associated with female genital organs and menstrual cycle: Secondary | ICD-10-CM | POA: Insufficient documentation

## 2011-12-24 DIAGNOSIS — R102 Pelvic and perineal pain: Secondary | ICD-10-CM

## 2011-12-24 LAB — COMPREHENSIVE METABOLIC PANEL
AST: 19 U/L (ref 0–37)
CO2: 27 mEq/L (ref 19–32)
Calcium: 9.1 mg/dL (ref 8.4–10.5)
Creatinine, Ser: 0.71 mg/dL (ref 0.50–1.10)
GFR calc Af Amer: >90 mL/min (ref >90)
GFR calc non Af Amer: >90 mL/min (ref >90)
Glucose, Bld: 93 mg/dL (ref 70–99)
Total Protein: 7 g/dL (ref 6.0–8.3)

## 2011-12-24 LAB — URINALYSIS, MICROSCOPIC ONLY
Bilirubin Urine: NEGATIVE
Glucose, UA: NEGATIVE mg/dL
Hgb urine dipstick: NEGATIVE
Specific Gravity, Urine: 1.029 (ref 1.005–1.030)
Urobilinogen, UA: 0.2 mg/dL (ref 0.0–1.0)
pH: 6 (ref 5.0–8.0)

## 2011-12-24 LAB — CBC WITH DIFFERENTIAL/PLATELET
Basophils Absolute: 0.1 10*3/uL (ref 0.0–0.1)
Eosinophils Absolute: 1.1 10*3/uL — ABNORMAL HIGH (ref 0.0–0.7)
Eosinophils Relative: 12 % — ABNORMAL HIGH (ref 0–5)
HCT: 36 % (ref 36.0–46.0)
Lymphocytes Relative: 45 % (ref 12–46)
MCH: 28.9 pg (ref 26.0–34.0)
MCV: 87.4 fL (ref 78.0–100.0)
Monocytes Absolute: 0.3 10*3/uL (ref 0.1–1.0)
Platelets: 341 10*3/uL (ref 150–400)
RDW: 14.5 % (ref 11.5–15.5)
WBC: 9 10*3/uL (ref 4.0–10.5)

## 2011-12-24 LAB — WET PREP, GENITAL

## 2011-12-24 MED ORDER — ONDANSETRON 8 MG PO TBDP
8.0000 mg | ORAL_TABLET | Freq: Once | ORAL | Status: AC
  Administered 2011-12-24: 8 mg via ORAL
  Filled 2011-12-24: qty 1

## 2011-12-24 MED ORDER — OXYCODONE-ACETAMINOPHEN 5-325 MG PO TABS
2.0000 | ORAL_TABLET | Freq: Once | ORAL | Status: AC
  Administered 2011-12-24: 2 via ORAL
  Filled 2011-12-24: qty 2

## 2011-12-24 MED ORDER — ONDANSETRON 4 MG PO TBDP
4.0000 mg | ORAL_TABLET | Freq: Once | ORAL | Status: AC
  Administered 2011-12-24: 4 mg via ORAL
  Filled 2011-12-24: qty 1

## 2011-12-24 NOTE — ED Provider Notes (Addendum)
History     CSN: [id number]  Arrival date & time 12/24/11  2032   First MD Initiated Contact with Patient 12/24/11 2300      Chief Complaint  Patient presents with  . Abdominal Pain    (Consider location/radiation/quality/duration/timing/severity/associated sxs/prior treatment) HPI This is a 26 year old female with a six-day history of left suprapubic pain. The pain is gotten worse and she states it is moderate to severe now. It is painful at rest and worse with movement or palpation. There is associated vaginal discharge but no vaginal bleeding. She is also had 2 days of nausea, vomiting and diarrhea. She's had some blood in her urine. She's had a fever to "100 point something" and chills. She has taken hydrocodone without relief. She has a history of ovarian cysts.  Past Medical History  Diagnosis Date  . Headache   . Anxiety   . Drug use     Past Surgical History  Procedure Date  . Cholecystectomy   . Facial reconstruction surgery   . Cholecystectomy   . Ovarian cyst removal   . Tympanostomy tube placement   . Mastoidectomy revision     Family History  Problem Relation Age of Onset  . Diabetes Maternal Aunt   . Heart disease Maternal Aunt   . Diabetes Maternal Grandmother   . Diabetes Maternal Grandfather   . Heart disease Maternal Grandfather   . Anesthesia problems Neg Hx     History  Substance Use Topics  . Smoking status: Current Every Day Smoker -- 0.2 packs/day    Types: Cigarettes  . Smokeless tobacco: Never Used  . Alcohol Use: No    OB History    Grav Para Term Preterm Abortions TAB SAB Ect Mult Living   2 2 2       2       Review of Systems  All other systems reviewed and are negative.    Allergies  Nubain  Home Medications   Current Outpatient Rx  Name Route Sig Dispense Refill  . XANAX PO Oral Take 1 tablet by mouth daily. Pt states she takes this medication however cant tell us what pharmacy she fills this at.    Marland Kitchen  HYDROCODONE-ACETAMINOPHEN 5-325 MG PO TABS Oral Take 1-2 tablets by mouth every 6 (six) hours as needed. pain    . DIPHENHYDRAMINE HCL 25 MG PO TABS Oral Take 1 tablet (25 mg total) by mouth every 6 (six) hours. 20 tablet 0    BP 138/75  Pulse 86  Temp 98.4 F (36.9 C) (Oral)  Resp 16  Ht 5\' 3"  (1.6 m)  Wt 180 lb (81.647 kg)  BMI 31.89 kg/m2  SpO2 99%  LMP 12/04/2011  Breastfeeding? Unknown  Physical Exam General: Well-developed, well-nourished female in no acute distress; appearance consistent with age of record HENT: normocephalic; old, well-healed scarring of right cheek and periorbital region Eyes: pupils equal round and reactive to light; extraocular muscles intact Neck: supple Heart: regular rate and rhythm Lungs: clear to auscultation bilaterally Abdomen: soft; nondistended; left suprapubic tenderness; no masses or hepatosplenomegaly; bowel sounds present GU: No CVA tenderness; normal external genitalia; white vaginal discharge; no vaginal bleeding; cervical motion tenderness; left adnexal tenderness Extremities: No deformity; full range of motion; pulses normal; no edema Neurologic: Awake, alert and oriented; motor function intact in all extremities and symmetric; no facial droop Skin: Warm and dry Psychiatric: Normal mood and affect    ED Course  Procedures (including critical care time)  MDM   Nursing notes and vitals signs, including pulse oximetry, reviewed.  Summary of this visit's results, reviewed by myself:  Labs:  Results for orders placed during the hospital encounter of 12/24/11  URINALYSIS, MICROSCOPIC ONLY      Component Value Range   Color, Urine YELLOW  YELLOW   APPearance TURBID (*) CLEAR   Specific Gravity, Urine 1.029  1.005 - 1.030   pH 6.0  5.0 - 8.0   Glucose, UA NEGATIVE  NEGATIVE mg/dL   Hgb urine dipstick NEGATIVE  NEGATIVE   Bilirubin Urine NEGATIVE  NEGATIVE   Ketones, ur NEGATIVE  NEGATIVE mg/dL   Protein, ur NEGATIVE   NEGATIVE mg/dL   Urobilinogen, UA 0.2  0.0 - 1.0 mg/dL   Nitrite NEGATIVE  NEGATIVE   Leukocytes, UA LARGE (*) NEGATIVE   WBC, UA 11-20  <3 WBC/hpf   Bacteria, UA MANY (*) RARE   Squamous Epithelial / LPF MANY (*) RARE   Urine-Other MUCOUS PRESENT    CBC WITH DIFFERENTIAL      Component Value Range   WBC 9.0  4.0 - 10.5 K/uL   RBC 4.12  3.87 - 5.11 MIL/uL   Hemoglobin 11.9 (*) 12.0 - 15.0 g/dL   HCT 95.6  21.3 - 08.6 %   MCV 87.4  78.0 - 100.0 fL   MCH 28.9  26.0 - 34.0 pg   MCHC 33.1  30.0 - 36.0 g/dL   RDW 57.8  46.9 - 62.9 %   Platelets 341  150 - 400 K/uL   Neutrophils Relative 39 (*) 43 - 77 %   Neutro Abs 3.5  1.7 - 7.7 K/uL   Lymphocytes Relative 45  12 - 46 %   Lymphs Abs 4.0  0.7 - 4.0 K/uL   Monocytes Relative 4  3 - 12 %   Monocytes Absolute 0.3  0.1 - 1.0 K/uL   Eosinophils Relative 12 (*) 0 - 5 %   Eosinophils Absolute 1.1 (*) 0.0 - 0.7 K/uL   Basophils Relative 1  0 - 1 %   Basophils Absolute 0.1  0.0 - 0.1 K/uL  COMPREHENSIVE METABOLIC PANEL      Component Value Range   Sodium 133 (*) 135 - 145 mEq/L   Potassium 3.9  3.5 - 5.1 mEq/L   Chloride 99  96 - 112 mEq/L   CO2 27  19 - 32 mEq/L   Glucose, Bld 93  70 - 99 mg/dL   BUN 8  6 - 23 mg/dL   Creatinine, Ser 5.28  0.50 - 1.10 mg/dL   Calcium 9.1  8.4 - 41.3 mg/dL   Total Protein 7.0  6.0 - 8.3 g/dL   Albumin 3.4 (*) 3.5 - 5.2 g/dL   AST 19  0 - 37 U/L   ALT 17  0 - 35 U/L   Alkaline Phosphatase 50  39 - 117 U/L   Total Bilirubin 0.2 (*) 0.3 - 1.2 mg/dL   GFR calc non Af Amer >90  >90 mL/min   GFR calc Af Amer >90  >90 mL/min  WET PREP, GENITAL      Component Value Range   Yeast Wet Prep HPF POC NONE SEEN  NONE SEEN   Trich, Wet Prep NONE SEEN  NONE SEEN   Clue Cells Wet Prep HPF POC FEW (*) NONE SEEN   WBC, Wet Prep HPF POC FEW (*) NONE SEEN    Imaging Studies: US Transvaginal Non-ob  2012/01/13  *RADIOLOGY REPORT*  Clinical  Data: Pelvic pain  TRANSABDOMINAL AND TRANSVAGINAL ULTRASOUND OF PELVIS  Technique:  Both transabdominal and transvaginal ultrasound examinations of the pelvis were performed. Transabdominal technique was performed for global imaging of the pelvis including uterus, ovaries, adnexal regions, and pelvic cul-de-sac.  It was necessary to proceed with endovaginal exam following the transabdominal exam to visualize the endometrium.  Comparison:  None  Findings:  Uterus: Measures 9.0 x 5.0 x 5.2 cm. Normal sonographic appearance.  Endometrium: Measures up to 9 mm.  Right ovary:  Normal appearance/no adnexal mass.  Measures 4.5 x 2.6 x 2.9 cm.  Left ovary: Normal appearance/no adnexal mass.  Measures 3.6 x 1.9 x 3.2 cm.  Other findings: Small amount of free fluid in the left adnexa.  IMPRESSION: Small amount of free fluid within the left adnexa may be physiologic.  Otherwise, normal pelvic ultrasound.   Original Report Authenticated By: Waneta Martins, M.D.    US Pelvis Complete  12/25/2011  *RADIOLOGY REPORT*  Clinical Data: Pelvic pain  TRANSABDOMINAL AND TRANSVAGINAL ULTRASOUND OF PELVIS Technique:  Both transabdominal and transvaginal ultrasound examinations of the pelvis were performed. Transabdominal technique was performed for global imaging of the pelvis including uterus, ovaries, adnexal regions, and pelvic cul-de-sac.  It was necessary to proceed with endovaginal exam following the transabdominal exam to visualize the endometrium.  Comparison:  None  Findings:  Uterus: Measures 9.0 x 5.0 x 5.2 cm. Normal sonographic appearance.  Endometrium: Measures up to 9 mm.  Right ovary:  Normal appearance/no adnexal mass.  Measures 4.5 x 2.6 x 2.9 cm.  Left ovary: Normal appearance/no adnexal mass.  Measures 3.6 x 1.9 x 3.2 cm.  Other findings: Small amount of free fluid in the left adnexa.  IMPRESSION: Small amount of free fluid within the left adnexa may be physiologic.  Otherwise, normal pelvic ultrasound.   Original Report Authenticated By: Waneta Martins, M.D.    3:23 AM Given  significant tenderness on exam we will treat for PID. Urinalysis also suggests a urinary tract infection. Patient advised of this plan.           Hanley Seamen, MD 12/25/11 8295  Hanley Seamen, MD 12/25/11 [phone]

## 2011-12-24 NOTE — ED Notes (Addendum)
Pt reports 10/10 abdominal pain that began on Saturday. Pt reports pain while urinating and has noticed blood in her urine, pt also reports vaginal discharge. Pt has taken 5 mg of hydrocodone without relief. Pt also reports nausea, vomiting, and diarrhea.

## 2011-12-25 ENCOUNTER — Other Ambulatory Visit (HOSPITAL_COMMUNITY): Payer: Medicaid Other

## 2011-12-25 ENCOUNTER — Emergency Department (HOSPITAL_COMMUNITY): Payer: Medicaid Other

## 2011-12-25 LAB — GC/CHLAMYDIA PROBE AMP, GENITAL
Chlamydia, DNA Probe: NEGATIVE
GC Probe Amp, Genital: NEGATIVE

## 2011-12-25 MED ORDER — AZITHROMYCIN 250 MG PO TABS: ORAL_TABLET | ORAL | Status: DC

## 2011-12-25 MED ORDER — NITROFURANTOIN MONOHYD MACRO 100 MG PO CAPS: 100.0000 mg | ORAL_CAPSULE | Freq: Two times a day (BID) | ORAL | Status: DC

## 2011-12-25 MED ORDER — CEFTRIAXONE SODIUM 250 MG IJ SOLR
250.0000 mg | Freq: Once | INTRAMUSCULAR | Status: AC
  Administered 2011-12-25: 250 mg via INTRAMUSCULAR
  Filled 2011-12-25: qty 250

## 2011-12-25 MED ORDER — OXYCODONE-ACETAMINOPHEN 5-325 MG PO TABS: 1.0000 | ORAL_TABLET | Freq: Four times a day (QID) | ORAL | Status: DC | PRN

## 2011-12-25 MED ORDER — AZITHROMYCIN 250 MG PO TABS
1000.0000 mg | ORAL_TABLET | Freq: Once | ORAL | Status: AC
  Administered 2011-12-25: 1000 mg via ORAL
  Filled 2011-12-25: qty 4

## 2011-12-25 NOTE — ED Notes (Signed)
Pt to have transvaginal US at this time.

## 2011-12-26 LAB — URINE CULTURE: Colony Count: 80000

## 2011-12-28 ENCOUNTER — Ambulatory Visit (HOSPITAL_COMMUNITY): Admission: RE | Admit: 2011-12-28 | Payer: Medicaid Other | Source: Ambulatory Visit

## 2011-12-28 ENCOUNTER — Ambulatory Visit (HOSPITAL_COMMUNITY): Payer: Medicaid Other

## 2012-01-18 ENCOUNTER — Emergency Department (HOSPITAL_COMMUNITY): Payer: Medicaid Other

## 2012-01-18 ENCOUNTER — Emergency Department (HOSPITAL_COMMUNITY)
Admission: EM | Admit: 2012-01-18 | Discharge: 2012-01-19 | Disposition: A | Discharge status: Home / Self Care | Payer: Medicaid Other | Attending: Emergency Medicine | Admitting: Emergency Medicine

## 2012-01-18 ENCOUNTER — Encounter (HOSPITAL_COMMUNITY): Payer: Self-pay | Admitting: *Deleted

## 2012-01-18 DIAGNOSIS — S161XXA Strain of muscle, fascia and tendon at neck level, initial encounter: Secondary | ICD-10-CM

## 2012-01-18 DIAGNOSIS — Y929 Unspecified place or not applicable: Secondary | ICD-10-CM | POA: Insufficient documentation

## 2012-01-18 DIAGNOSIS — S139XXA Sprain of joints and ligaments of unspecified parts of neck, initial encounter: Secondary | ICD-10-CM | POA: Insufficient documentation

## 2012-01-18 DIAGNOSIS — F17200 Nicotine dependence, unspecified, uncomplicated: Secondary | ICD-10-CM | POA: Insufficient documentation

## 2012-01-18 DIAGNOSIS — M2550 Pain in unspecified joint: Secondary | ICD-10-CM | POA: Insufficient documentation

## 2012-01-18 DIAGNOSIS — S39012A Strain of muscle, fascia and tendon of lower back, initial encounter: Secondary | ICD-10-CM

## 2012-01-18 DIAGNOSIS — F411 Generalized anxiety disorder: Secondary | ICD-10-CM | POA: Insufficient documentation

## 2012-01-18 DIAGNOSIS — Y9389 Activity, other specified: Secondary | ICD-10-CM | POA: Insufficient documentation

## 2012-01-18 DIAGNOSIS — M549 Dorsalgia, unspecified: Secondary | ICD-10-CM | POA: Insufficient documentation

## 2012-01-18 DIAGNOSIS — S335XXA Sprain of ligaments of lumbar spine, initial encounter: Secondary | ICD-10-CM | POA: Insufficient documentation

## 2012-01-18 DIAGNOSIS — Z79899 Other long term (current) drug therapy: Secondary | ICD-10-CM | POA: Insufficient documentation

## 2012-01-18 LAB — POCT PREGNANCY, URINE: Preg Test, Ur: NEGATIVE

## 2012-01-18 MED ORDER — METHOCARBAMOL 500 MG PO TABS: 1000.0000 mg | ORAL_TABLET | Freq: Four times a day (QID) | ORAL | Status: AC

## 2012-01-18 MED ORDER — KETOROLAC TROMETHAMINE 60 MG/2ML IM SOLN
60.0000 mg | Freq: Once | INTRAMUSCULAR | Status: AC
  Administered 2012-01-18: 60 mg via INTRAMUSCULAR
  Filled 2012-01-18: qty 2

## 2012-01-18 MED ORDER — IBUPROFEN 600 MG PO TABS: 600.0000 mg | ORAL_TABLET | Freq: Four times a day (QID) | ORAL | Status: DC | PRN
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
## 2012-01-18 NOTE — ED Notes (Signed)
Removed backboard per protocol. C-spine position maintained throughout process. Elevated head of bed 30 degrees. Patient states back pain is better after repositioning.

## 2012-01-18 NOTE — ED Notes (Signed)
Patient removed straps from lower extremities. Patient currently lying on back board with bilateral legs bent at the knees and cervical collar in place.

## 2012-01-18 NOTE — ED Notes (Addendum)
Danielle Chandler requested to ambulate patient. Patient ambulated to outside of room and stated "I can't walk any further. My back hurts too bad." Placed patient back into bed.

## 2012-01-18 NOTE — ED Notes (Signed)
Gave ginger ale as requested and MD approved.

## 2012-01-18 NOTE — ED Notes (Signed)
Patient to [facility] to collect urine sample.
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
## 2012-01-19 MED ORDER — METHOCARBAMOL 500 MG PO TABS
1000.0000 mg | ORAL_TABLET | Freq: Once | ORAL | Status: AC
  Administered 2012-01-19: 1000 mg via ORAL
  Filled 2012-01-19: qty 2

## 2012-01-19 NOTE — ED Notes (Signed)
Patient stated she spoke with father and he would be here in approximately 15 minutes to pick her up. Patient discharged via wheelchair to waiting room to wait for ride to pick her up.
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
## 2012-01-19 NOTE — ED Notes (Signed)
Patient states she does not have a ride home and has no one to call until morning. Advised patient that she will be discharged and have to wait in the waiting room until her ride arrives. Patient then gave me the number to her father. Called father and he stated he would be here shortly to pick up patient.

## 2012-01-20 NOTE — ED Provider Notes (Signed)
Medical screening examination/treatment/procedure(s) were performed by non-physician practitioner and as supervising physician I was immediately available for consultation/collaboration.   Lessa Huge L Emme Rosenau, MD 01/20/12 1245 

## 2012-05-27 ENCOUNTER — Encounter (HOSPITAL_COMMUNITY): Payer: Self-pay | Admitting: Emergency Medicine

## 2012-05-27 ENCOUNTER — Emergency Department (HOSPITAL_COMMUNITY)
Admission: EM | Admit: 2012-05-27 | Discharge: 2012-05-28 | Disposition: A | Discharge status: Other Acute Inpatient Hospital | Payer: Medicaid Other | Attending: Emergency Medicine | Admitting: Emergency Medicine

## 2012-05-27 DIAGNOSIS — Z8679 Personal history of other diseases of the circulatory system: Secondary | ICD-10-CM | POA: Insufficient documentation

## 2012-05-27 DIAGNOSIS — F17200 Nicotine dependence, unspecified, uncomplicated: Secondary | ICD-10-CM | POA: Insufficient documentation

## 2012-05-27 DIAGNOSIS — Z79899 Other long term (current) drug therapy: Secondary | ICD-10-CM | POA: Insufficient documentation

## 2012-05-27 DIAGNOSIS — F411 Generalized anxiety disorder: Secondary | ICD-10-CM | POA: Insufficient documentation

## 2012-05-27 DIAGNOSIS — F419 Anxiety disorder, unspecified: Secondary | ICD-10-CM

## 2012-05-27 DIAGNOSIS — F1120 Opioid dependence, uncomplicated: Secondary | ICD-10-CM | POA: Insufficient documentation

## 2012-05-27 DIAGNOSIS — Z3202 Encounter for pregnancy test, result negative: Secondary | ICD-10-CM | POA: Insufficient documentation

## 2012-05-27 LAB — CBC
HCT: 39.7 % (ref 36.0–46.0)
Hemoglobin: 12.7 g/dL (ref 12.0–15.0)
MCH: 27.5 pg (ref 26.0–34.0)
MCHC: 32 g/dL (ref 30.0–36.0)
MCV: 86.1 fL (ref 78.0–100.0)
Platelets: 408 10*3/uL — ABNORMAL HIGH (ref 150–400)
RBC: 4.61 MIL/uL (ref 3.87–5.11)
RDW: 14.1 % (ref 11.5–15.5)
WBC: 8.4 10*3/uL (ref 4.0–10.5)

## 2012-05-27 LAB — RAPID URINE DRUG SCREEN, HOSP PERFORMED
Amphetamines: NOT DETECTED
Barbiturates: NOT DETECTED
Benzodiazepines: NOT DETECTED
Cocaine: NOT DETECTED
Opiates: POSITIVE — AB
Tetrahydrocannabinol: POSITIVE — AB

## 2012-05-27 LAB — COMPREHENSIVE METABOLIC PANEL
ALT: 13 U/L (ref 0–35)
AST: 17 U/L (ref 0–37)
Albumin: 3.8 g/dL (ref 3.5–5.2)
Alkaline Phosphatase: 71 U/L (ref 39–117)
BUN: 10 mg/dL (ref 6–23)
CO2: 27 mEq/L (ref 19–32)
Calcium: 9.6 mg/dL (ref 8.4–10.5)
Chloride: 99 mEq/L (ref 96–112)
Creatinine, Ser: 0.76 mg/dL (ref 0.50–1.10)
GFR calc Af Amer: >90 mL/min (ref >90)
GFR calc non Af Amer: >90 mL/min (ref >90)
Glucose, Bld: 94 mg/dL (ref 70–99)
Potassium: 4.2 mEq/L (ref 3.5–5.1)
Sodium: 135 mEq/L (ref 135–145)
Total Bilirubin: 0.5 mg/dL (ref 0.3–1.2)
Total Protein: 8.4 g/dL — ABNORMAL HIGH (ref 6.0–8.3)

## 2012-05-27 LAB — ACETAMINOPHEN LEVEL: Acetaminophen (Tylenol), Serum: <15 ug/mL (ref 10–30)

## 2012-05-27 LAB — SALICYLATE LEVEL: Salicylate Lvl: <2 mg/dL — ABNORMAL LOW (ref 2.8–20.0)

## 2012-05-27 LAB — ETHANOL: Alcohol, Ethyl (B): <11 mg/dL (ref 0–11)

## 2012-05-27 MED ORDER — IBUPROFEN 600 MG PO TABS: 600.0000 mg | ORAL_TABLET | Freq: Three times a day (TID) | ORAL | Status: DC | PRN

## 2012-05-27 MED ORDER — ALUM & MAG HYDROXIDE-SIMETH 200-200-20 MG/5ML PO SUSP
30.0000 mL | ORAL | Status: DC | PRN
  Filled 2012-05-27: qty 30

## 2012-05-27 MED ORDER — METHOCARBAMOL 500 MG PO TABS
500.0000 mg | ORAL_TABLET | Freq: Three times a day (TID) | ORAL | Status: DC | PRN
  Administered 2012-05-27 – 2012-05-28 (×2): 500 mg via ORAL
  Filled 2012-05-27 (×2): qty 1

## 2012-05-27 MED ORDER — NICOTINE 21 MG/24HR TD PT24: 21.0000 mg | MEDICATED_PATCH | Freq: Every day | TRANSDERMAL | Status: DC

## 2012-05-27 MED ORDER — DICYCLOMINE HCL 20 MG PO TABS
20.0000 mg | ORAL_TABLET | Freq: Four times a day (QID) | ORAL | Status: DC | PRN
  Administered 2012-05-28: 20 mg via ORAL
  Filled 2012-05-27: qty 1

## 2012-05-27 MED ORDER — ONDANSETRON 4 MG PO TBDP
4.0000 mg | ORAL_TABLET | Freq: Four times a day (QID) | ORAL | Status: DC | PRN
  Administered 2012-05-28: 4 mg via ORAL
  Filled 2012-05-27: qty 1

## 2012-05-27 MED ORDER — LOPERAMIDE HCL 2 MG PO CAPS
2.0000 mg | ORAL_CAPSULE | ORAL | Status: DC | PRN
  Administered 2012-05-28: 2 mg via ORAL
  Filled 2012-05-27: qty 1

## 2012-05-27 MED ORDER — ZOLPIDEM TARTRATE 5 MG PO TABS
5.0000 mg | ORAL_TABLET | Freq: Every evening | ORAL | Status: DC | PRN
  Administered 2012-05-28: 5 mg via ORAL
  Filled 2012-05-27: qty 1

## 2012-05-27 MED ORDER — ONDANSETRON HCL 4 MG PO TABS: 4.0000 mg | ORAL_TABLET | Freq: Three times a day (TID) | ORAL | Status: DC | PRN

## 2012-05-27 MED ORDER — NAPROXEN 500 MG PO TABS: 500.0000 mg | ORAL_TABLET | Freq: Two times a day (BID) | ORAL | Status: DC | PRN

## 2012-05-27 MED ORDER — ACETAMINOPHEN 325 MG PO TABS
650.0000 mg | ORAL_TABLET | ORAL | Status: DC | PRN
  Administered 2012-05-27 – 2012-05-28 (×2): 650 mg via ORAL
  Filled 2012-05-27 (×2): qty 2

## 2012-05-27 MED ORDER — HYDROXYZINE HCL 25 MG PO TABS
25.0000 mg | ORAL_TABLET | Freq: Four times a day (QID) | ORAL | Status: DC | PRN
  Administered 2012-05-28: 25 mg via ORAL
  Filled 2012-05-27: qty 1

## 2012-05-27 NOTE — ED Provider Notes (Signed)
History    This chart was scribed for non-physician practitioner working with Raeford Razor, MD by Smitty Pluck, ED scribe. This patient was seen in room [facility]TR1/[facility]PT1 and the patient's care was started at 5:49 PM.   CSN: [id number]  Arrival date & time 05/27/12  1638      Chief Complaint  Patient presents with  . Medical Clearance    HPI Danielle Chandler is a 27 y.o. female who presents to the Emergency Department wanting detox from heroin. She states she has used for 1 year but her usage has increased within the past week. She wants to stop because she wants to get her life together. She last used today PTA. Pt reports that she has moderate anxiety. She states that she normally injects into her arms. She mentions that she has tried to quite by herself but returned to using heroin. She reports that she has moderate bilateral arm pain. She reports that she smokes spot occasionally. She denies using other illegal drugs. Pt denies SI, HI, auditory hallucinations, visual hallucinations, fever, chills, nausea, vomiting, diarrhea, weakness, cough, SOB and any other pain.     Past Medical History  Diagnosis Date  . Headache   . Anxiety   . Drug use     Past Surgical History  Procedure Laterality Date  . Cholecystectomy    . Facial reconstruction surgery    . Cholecystectomy    . Ovarian cyst removal    . Tympanostomy tube placement    . Mastoidectomy revision      Family History  Problem Relation Age of Onset  . Diabetes Maternal Aunt   . Heart disease Maternal Aunt   . Diabetes Maternal Grandmother   . Diabetes Maternal Grandfather   . Heart disease Maternal Grandfather   . Anesthesia problems Neg Hx     History  Substance Use Topics  . Smoking status: Current Every Day Smoker -- 0.25 packs/day    Types: Cigarettes  . Smokeless tobacco: Never Used  . Alcohol Use: No    OB History   Grav Para Term Preterm Abortions TAB SAB Ect Mult Living   2 2 2       2       Review of  Systems  Constitutional: Negative for fever and chills.  Respiratory: Negative for cough and shortness of breath.   Gastrointestinal: Negative for nausea, vomiting and diarrhea.  Neurological: Negative for weakness.  Psychiatric/Behavioral: Negative for suicidal ideas, hallucinations and self-injury.  All other systems reviewed and are negative.    Allergies  Nubain  Home Medications   Current Outpatient Rx  Name  Route  Sig  Dispense  Refill  . acetaminophen (TYLENOL) 500 MG tablet   Oral   Take 1,000 mg by mouth every 6 (six) hours as needed. For pain         . ALPRAZolam (XANAX) 1 MG tablet   Oral   Take 1 mg by mouth 2 (two) times daily.         Marland Kitchen ibuprofen (ADVIL,MOTRIN) 600 MG tablet   Oral   Take 1 tablet (600 mg total) by mouth every 6 (six) hours as needed for pain.   30 tablet   0     BP 99/69  Pulse 76  Temp(Src) 99 F (37.2 C) (Oral)  Resp 14  SpO2 96%  LMP 04/29/2012  Physical Exam  Nursing note and vitals reviewed. Constitutional: She is oriented to person, place, and time. She appears well-developed and well-nourished.  No distress.  HENT:  Head: Normocephalic and atraumatic.  Eyes: EOM are normal.  Neck: Neck supple. No tracheal deviation present.  Cardiovascular: Normal rate.   Pulmonary/Chest: Effort normal. No respiratory distress.  Musculoskeletal: Normal range of motion.  Neurological: She is alert and oriented to person, place, and time.  Skin: Skin is warm and dry.  Psychiatric: Her behavior is normal. Her mood appears anxious. Her affect is not angry. She is not actively hallucinating. She does not exhibit a depressed mood. She expresses no homicidal and no suicidal ideation. She expresses no suicidal plans and no homicidal plans.    ED Course  Procedures (including critical care time) DIAGNOSTIC STUDIES: Oxygen Saturation is 96% on room air, adequate by my interpretation.    COORDINATION OF CARE: 5:52 PM Discussed ED treatment  with pt and pt agrees.     Labs Reviewed  CBC - Abnormal; Notable for the following:    Platelets 408 (*)    All other components within normal limits  COMPREHENSIVE METABOLIC PANEL - Abnormal; Notable for the following:    Total Protein 8.4 (*)    All other components within normal limits  SALICYLATE LEVEL - Abnormal; Notable for the following:    Salicylate Lvl <2.0 (*)    All other components within normal limits  URINE RAPID DRUG SCREEN (HOSP PERFORMED) - Abnormal; Notable for the following:    Opiates POSITIVE (*)    Tetrahydrocannabinol POSITIVE (*)    All other components within normal limits  ACETAMINOPHEN LEVEL  ETHANOL   No results found.   1. Anxiety   2. Heroin addiction       MDM  ACT consulted, Holding orders placed, home meds reviewed. Labs reviewed.   Dorthula Matas, PA-C 05/27/12 2227

## 2012-05-27 NOTE — ED Notes (Signed)
Patient has been using heroin for about 1 year.  Patient states she wants detox and wants to be herself again.  Patient denies SI/HI.  Patient states the last time she used was early this morning.

## 2012-05-28 ENCOUNTER — Encounter (HOSPITAL_COMMUNITY): Payer: Self-pay | Admitting: *Deleted

## 2012-05-28 ENCOUNTER — Inpatient Hospital Stay (HOSPITAL_COMMUNITY)
Admission: AD | Admit: 2012-05-28 | Discharge: 2012-05-30 | DRG: 897 | Disposition: A | Discharge status: Home / Self Care | Payer: Medicaid Other | Attending: Psychiatry | Admitting: Psychiatry

## 2012-05-28 ENCOUNTER — Encounter (HOSPITAL_COMMUNITY): Payer: Self-pay

## 2012-05-28 DIAGNOSIS — F1120 Opioid dependence, uncomplicated: Principal | ICD-10-CM | POA: Diagnosis present

## 2012-05-28 DIAGNOSIS — Z79899 Other long term (current) drug therapy: Secondary | ICD-10-CM

## 2012-05-28 DIAGNOSIS — F411 Generalized anxiety disorder: Secondary | ICD-10-CM | POA: Diagnosis present

## 2012-05-28 DIAGNOSIS — F1994 Other psychoactive substance use, unspecified with psychoactive substance-induced mood disorder: Secondary | ICD-10-CM

## 2012-05-28 DIAGNOSIS — F1910 Other psychoactive substance abuse, uncomplicated: Secondary | ICD-10-CM

## 2012-05-28 MED ORDER — TRAZODONE HCL 50 MG PO TABS
50.0000 mg | ORAL_TABLET | Freq: Every evening | ORAL | Status: DC | PRN
  Administered 2012-05-28 – 2012-05-29 (×4): 50 mg via ORAL
  Filled 2012-05-28: qty 28
  Filled 2012-05-28 (×3): qty 1
  Filled 2012-05-28: qty 28
  Filled 2012-05-28 (×3): qty 1

## 2012-05-28 MED ORDER — MAGNESIUM HYDROXIDE 400 MG/5ML PO SUSP: 30.0000 mL | Freq: Every day | ORAL | Status: DC | PRN

## 2012-05-28 MED ORDER — CLONIDINE HCL 0.1 MG PO TABS
0.1000 mg | ORAL_TABLET | Freq: Four times a day (QID) | ORAL | Status: AC
Start: 2012-05-28 — End: 2012-05-30
  Administered 2012-05-28 – 2012-05-29 (×6): 0.1 mg via ORAL
  Filled 2012-05-28 (×8): qty 1

## 2012-05-28 MED ORDER — ALUM & MAG HYDROXIDE-SIMETH 200-200-20 MG/5ML PO SUSP: 30.0000 mL | ORAL | Status: DC | PRN

## 2012-05-28 MED ORDER — NAPROXEN 500 MG PO TABS
500.0000 mg | ORAL_TABLET | Freq: Two times a day (BID) | ORAL | Status: DC | PRN
  Administered 2012-05-28 – 2012-05-29 (×4): 500 mg via ORAL
  Filled 2012-05-28 (×4): qty 1

## 2012-05-28 MED ORDER — HYDROXYZINE HCL 25 MG PO TABS
25.0000 mg | ORAL_TABLET | Freq: Four times a day (QID) | ORAL | Status: DC | PRN
  Administered 2012-05-28 – 2012-05-30 (×6): 25 mg via ORAL
  Filled 2012-05-28: qty 1

## 2012-05-28 MED ORDER — METHOCARBAMOL 500 MG PO TABS
500.0000 mg | ORAL_TABLET | Freq: Three times a day (TID) | ORAL | Status: DC | PRN
  Administered 2012-05-28 – 2012-05-30 (×5): 500 mg via ORAL
  Filled 2012-05-28 (×5): qty 1

## 2012-05-28 MED ORDER — CLONIDINE HCL 0.1 MG PO TABS
0.1000 mg | ORAL_TABLET | Freq: Every day | ORAL | Status: DC
  Filled 2012-05-28: qty 1

## 2012-05-28 MED ORDER — CLONIDINE HCL 0.1 MG PO TABS
0.1000 mg | ORAL_TABLET | ORAL | Status: DC
  Administered 2012-05-30: 0.1 mg via ORAL
  Filled 2012-05-28 (×4): qty 1

## 2012-05-28 MED ORDER — ONDANSETRON 4 MG PO TBDP
4.0000 mg | ORAL_TABLET | Freq: Four times a day (QID) | ORAL | Status: DC | PRN
  Administered 2012-05-28 – 2012-05-29 (×6): 4 mg via ORAL
  Filled 2012-05-28 (×3): qty 1

## 2012-05-28 MED ORDER — LOPERAMIDE HCL 2 MG PO CAPS
2.0000 mg | ORAL_CAPSULE | ORAL | Status: DC | PRN
  Administered 2012-05-28: 4 mg via ORAL

## 2012-05-28 MED ORDER — ACETAMINOPHEN 325 MG PO TABS
650.0000 mg | ORAL_TABLET | Freq: Four times a day (QID) | ORAL | Status: DC | PRN
  Administered 2012-05-29 – 2012-05-30 (×2): 650 mg via ORAL

## 2012-05-28 MED ORDER — DICYCLOMINE HCL 20 MG PO TABS
20.0000 mg | ORAL_TABLET | Freq: Four times a day (QID) | ORAL | Status: DC | PRN
  Administered 2012-05-28 – 2012-05-29 (×5): 20 mg via ORAL
  Filled 2012-05-28 (×5): qty 1

## 2012-05-28 NOTE — Progress Notes (Signed)
Adult Psychoeducational Group Note  Date:  05/28/2012 Time:  0900  Group Topic/Focus:  Self inventory sheet  Participation Level:  Active  Participation Quality:  Appropriate  Affect:  Appropriate  Cognitive:  Appropriate  Insight: Appropriate  Engagement in Group:  Engaged  Modes of Intervention:  Clarification, Exploration and Problem-solving  Additional Comments:  Participated.  Roselee Culver 05/28/2012, 1:09 PM

## 2012-05-28 NOTE — Clinical Social Work Note (Signed)
[facility] Group Notes:  (Clinical Social Work)  05/28/2012     10-11AM  Summary of Progress/Problems:   The main focus of today's process group was for the patient to identify ways in which they have in the past sabotaged their own recovery. Motivational Interviewing was utilized to ask the group members what they get out of their substance use, and what they want to change.  The Stages of Change were explained, and members identified where they currently are with regard to stages of change.  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  Her husband was selling it, and they started using it.  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  She is in preparation stage of change.  Type of Therapy:  Group Therapy - Process   Participation Level:  Active  Participation Quality:  Monopolizing and Sharing  Affect:  Blunted and Depressed  Cognitive:  Alert and Oriented  Insight:  Engaged  Engagement in Therapy:  Engaged  Modes of Intervention:  Education, Teacher, English as a foreign language, Exploration, Discussion, Motivational Interviewing   Ambrose Mantle, LCSW 05/28/2012, 12:19 PM

## 2012-05-28 NOTE — BH Assessment (Signed)
Assessment Note   Danielle Chandler is a 27 y.o. female presenting to [facility] for detox from heroin.  Pt denies SI/HI/Psych.  Pt also admits to using THC.  Pt repots the the following: pt uses 2 grams daily, last use was 05/27/12, states she had a "20".  Pt is shooting in bilateral arms with moderate bruising on right arm.  Pt tells this Clinical research associate that she began snorting heroin 2 yrs ago and then started shooting last year.  Pt states that she funds her habit by stealing and has 2 legal charges--Shoplifting, court dates 06/07/12 and 07/26/12.  Pt uses THC wkly--1 joint, last use was 05/26/12.  Pt has no prior detox history.  Pt c/o w/d sxs: chills, body aches, watery eyes, runny nose, dry throat, headache, itchy skin.  No hx of seizures or blackouts.    Axis I: Depressive D/O; Opioid Dep; Cannabis Abuse Axis II: Deferred Axis III:  Past Medical History  Diagnosis Date  . Headache   . Anxiety   . Drug use    Axis IV: other psychosocial or environmental problems, problems related to legal system/crime, problems related to social environment and problems with primary support group Axis V: 51-60 moderate symptoms  Past Medical History:  Past Medical History  Diagnosis Date  . Headache   . Anxiety   . Drug use     Past Surgical History  Procedure Laterality Date  . Cholecystectomy    . Facial reconstruction surgery    . Cholecystectomy    . Ovarian cyst removal    . Tympanostomy tube placement    . Mastoidectomy revision      Family History:  Family History  Problem Relation Age of Onset  . Diabetes Maternal Aunt   . Heart disease Maternal Aunt   . Diabetes Maternal Grandmother   . Diabetes Maternal Grandfather   . Heart disease Maternal Grandfather   . Anesthesia problems Neg Hx     Social History:  reports that she has been smoking Cigarettes.  She has been smoking about 0.25 packs per day. She has never used smokeless tobacco. She reports that she uses illicit drugs (Marijuana and  Heroin). She reports that she does not drink alcohol.  Additional Social History:  Alcohol / Drug Use Pain Medications: See MAR  Prescriptions: See MAR  Over the Counter: See MAR  Longest period of sobriety (when/how long): None  Negative Consequences of Use: Financial;Legal;Personal relationships Withdrawal Symptoms: Other (Comment) (Watery eyes, Runny nose, Dry throat, Headache, "Itchy skin",)  CIWA: CIWA-Ar BP: 99/69 mmHg Pulse Rate: 76 COWS: Clinical Opiate Withdrawal Scale (COWS) Resting Pulse Rate: Pulse Rate 80 or below Sweating: No report of chills or flushing Restlessness: Able to sit still Pupil Size: Pupils pinned or normal size for room light Bone or Joint Aches: Patient reports sever diffuse aching of joints/muscles Runny Nose or Tearing: Nasal stuffiness or unusually moist eyes GI Upset: No GI symptoms Tremor: No tremor Yawning: No yawning Anxiety or Irritability: None Gooseflesh Skin: Skin is smooth COWS Total Score: 3  Allergies:  Allergies  Allergen Reactions  . Nubain (Nalbuphine Hcl) Other (See Comments)    Patient said it made her hurt really badly, also burning sensation throughout body    Home Medications:  (Not in a hospital admission)  OB/GYN Status:  Patient's last menstrual period was 04/29/2012.  General Assessment Data Location of Assessment: [facility] Living Arrangements: Other (Comment);Parent (Lives with mother ) Can pt return to current living arrangement?: Yes Admission  Status: Voluntary Is patient capable of signing voluntary admission?: Yes Transfer from: Acute Hospital Referral Source: MD  Education Status Is patient currently in school?: No Current Grade: None  Highest grade of school patient has completed: None  Name of school: None  Contact person: None   Risk to self Suicidal Ideation: No Suicidal Intent: No Is patient at risk for suicide?: No Suicidal Plan?: No Access to Means: No What has been your use of drugs/alcohol  within the last 12 months?: Abusing: Heroin, THC  Previous Attempts/Gestures: No How many times?: 0 Other Self Harm Risks: None  Triggers for Past Attempts: None known Intentional Self Injurious Behavior: None Family Suicide History: No Recent stressful life event(s): Other (Comment) (SA) Persecutory voices/beliefs?: No Depression: Yes Depression Symptoms: Loss of interest in usual pleasures Substance abuse history and/or treatment for substance abuse?: Yes Suicide prevention information given to non-admitted patients: Not applicable  Risk to Others Homicidal Ideation: No Thoughts of Harm to Others: No Current Homicidal Intent: No Current Homicidal Plan: No Access to Homicidal Means: No Identified Victim: None  History of harm to others?: No Assessment of Violence: None Noted Violent Behavior Description: None  Does patient have access to weapons?: No Criminal Charges Pending?: Yes Describe Pending Criminal Charges: Shoplifting  Does patient have a court date: Yes Court Date: 06/07/12 (07/26/2012)  Psychosis Hallucinations: None noted Delusions: None noted  Mental Status Report Appear/Hygiene: Disheveled Eye Contact: Fair Motor Activity: Unremarkable Speech: Logical/coherent Level of Consciousness: Alert Mood: Depressed Affect: Depressed Anxiety Level: None Thought Processes: Coherent;Relevant Judgement: Unimpaired Orientation: Person;Place;Time;Situation Obsessive Compulsive Thoughts/Behaviors: None  Cognitive Functioning Concentration: Normal Memory: Recent Intact;Remote Intact IQ: Average Insight: Fair Impulse Control: Fair Appetite: Good Weight Loss: 0 Weight Gain: 0 Sleep: No Change Total Hours of Sleep: 8 Vegetative Symptoms: None  ADLScreening [facility] Assessment Services) Patient's cognitive ability adequate to safely complete daily activities?: Yes Patient able to express need for assistance with ADLs?: Yes Independently performs ADLs?: Yes  (appropriate for developmental age)  Abuse/Neglect [facility]) Physical Abuse: Denies Verbal Abuse: Denies Sexual Abuse: Denies  Prior Inpatient Therapy Prior Inpatient Therapy: No Prior Therapy Dates: None  Prior Therapy Facilty/Provider(s): None  Reason for Treatment: None   Prior Outpatient Therapy Prior Outpatient Therapy: No Prior Therapy Dates: None  Prior Therapy Facilty/Provider(s): None  Reason for Treatment: None   ADL Screening (condition at time of admission) Patient's cognitive ability adequate to safely complete daily activities?: Yes Patient able to express need for assistance with ADLs?: Yes Independently performs ADLs?: Yes (appropriate for developmental age) Weakness of Legs: None Weakness of Arms/Hands: None  Home Assistive Devices/Equipment Home Assistive Devices/Equipment: None  Therapy Consults (therapy consults require a physician order) PT Evaluation Needed: No OT Evalulation Needed: No SLP Evaluation Needed: No Abuse/Neglect Assessment (Assessment to be complete while patient is alone) Physical Abuse: Denies Verbal Abuse: Denies Sexual Abuse: Denies Exploitation of patient/patient's resources: Denies Self-Neglect: Denies Values / Beliefs Cultural Requests During Hospitalization: None Spiritual Requests During Hospitalization: None Consults Spiritual Care Consult Needed: No Social Work Consult Needed: No Merchant navy officer (For Healthcare) Advance Directive: Patient does not have advance directive;Patient would not like information Pre-existing out of facility DNR order (yellow form or pink MOST form): No Nutrition Screen- [facility] Patient's home diet: Regular Have you recently lost weight without trying?: No Have you been eating poorly because of a decreased appetite?: No Malnutrition Screening Tool Score: 0  Additional Information 1:1 In Past 12 Months?: No CIRT Risk: No Elopement Risk: No Does  patient have medical clearance?:  Yes     Disposition:  Disposition Initial Assessment Completed for this Encounter: Yes Disposition of Patient: Inpatient treatment program;Referred to [facility] ) Type of inpatient treatment program: Adult Patient referred to: Other (Comment) [facility] )  On Site Evaluation by:   Reviewed with Physician:     Murrell Redden 05/28/2012 12:50 AM

## 2012-05-28 NOTE — Progress Notes (Signed)
D.  Pt positive for group, interacting appropriately within milieu.  Experiencing withdrawal symptoms and requesting medication for this.  Denies SI/HI/hallucinations at this time.  A.  Support and encouragement offered, medication given as ordered for withdrawal symptoms.  R.  Pt remains safe on unit

## 2012-05-28 NOTE — Progress Notes (Signed)
Patient request detox from heroin. Complains of withdrawal symptoms and is having difficulty during admission process to remain alert and keeps trash can at side d/t feeling nauseated. Patient reports that her husband used to sell heroin and is currently locked up and she and her kids live with her mother. Patient denies si/hi/a/v hallucinations. Patient oriented to unit, safety maintained with 15 min checks will continue to monitor.

## 2012-05-28 NOTE — ED Notes (Signed)
Called an updated RN on the medications given to pt most recently and they are now ready for pt. Will Company secretary for tech to accompany security to [facility] for transport.

## 2012-05-28 NOTE — Tx Team (Signed)
Initial Interdisciplinary Treatment Plan  PATIENT STRENGTHS: (choose at least two) Ability for insight Supportive family/friends  PATIENT STRESSORS: Loss of husband, currently in jail*   PROBLEM LIST: Problem List/Patient Goals Date to be addressed Date deferred Reason deferred Estimated date of resolution  Heroin Detox 05/28/2012                       Goal- Patient wants to detox off heroin                               DISCHARGE CRITERIA:  Ability to meet basic life and health needs Adequate post-discharge living arrangements Improved stabilization in mood, thinking, and/or behavior Medical problems require only outpatient monitoring Motivation to continue treatment in a less acute level of care Need for constant or close observation no longer present Safe-care adequate arrangements made Verbal commitment to aftercare and medication compliance Withdrawal symptoms are absent or subacute and managed without 24-hour nursing intervention  PRELIMINARY DISCHARGE PLAN: Attend 12-step recovery group Participate in family therapy Return to previous living arrangement  PATIENT/FAMIILY INVOLVEMENT: This treatment plan has been presented to and reviewed with the patient, Danielle Chandler, and/or family member.  The patient and family have been given the opportunity to ask questions and make suggestions.  Floyce Stakes 05/28/2012, 6:07 AM

## 2012-05-28 NOTE — [facility] Suicide Risk Assessment (Signed)
Suicide Risk Assessment  Admission Assessment     Information obtained from: Patient Demographic factors:   Current Mental Status per Physician Patient denies suicidal or homicidal ideation, hallucinations, illusions, or delusions. Patient engages with good eye contact, is able to focus adequately in a one to one setting, and has clear goal directed thoughts. Patient speaks with a natural conversational volume, rate, and tone. Anxiety was reported at 9 on a scale of 1 the least and 10 the most. Depression was reported at 4 on the same scale. Patient is oriented times 4, recent and remote memory intact. Judgement: limited by her mental illness and her addictive thinking. Insight: limited by her mental illness and her addictive thinking.  Loss Factors: NA Historical Factors: NA Risk Reduction Factors: Responsible for children under 44 years of age;Sense of responsibility to family;Living with another person, especially a relative;Positive social support  CLINICAL FACTORS:   Alcohol/Substance Abuse/Dependencies  COGNITIVE FEATURES THAT CONTRIBUTE TO RISK:  Closed-mindedness Thought constriction (tunnel vision)    SUICIDE RISK:   Moderate:  Frequent suicidal ideation with limited intensity, and duration, some specificity in terms of plans, no associated intent, good self-control, limited dysphoria/symptomatology, some risk factors present, and identifiable protective factors, including available and accessible social support.  PLAN OF CARE: 1 Admit for crisis management and stabilization.  Estimate length of stay is 3 to 5 days.  2. Medication management to reduce current symptoms to base line and improve the patient's overall level of functioning.  3. Treat health problems as indicated:  4. Develop treatment plan to decrease risk of relapse upon discharge and the need for readmission.  5. Psycho-social education regarding relapse prevention and self-care.  6. Review and  reinstate any pertinent home medication for other health issues where appropriate.  7. Call for Consult with [facility] for additional specialty patient services as needed.  I certify that inpatient services furnished can reasonably be expected to improve the patient's condition.  Orson Aloe, MD, [facility] 05/28/2012 11:53 AM
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
## 2012-05-28 NOTE — ED Notes (Signed)
Pt transported to [facility] via security and tech.

## 2012-05-29 NOTE — Progress Notes (Signed)
D.  Pt appears tired on approach, still experiencing some withdrawal nausea and cramps.  Positive for evening AA group.  Denies SI/HI/hallucinations at this time.  Interacting appropriately within milieu.  A.  Support and encouragement offered, medication given as ordered for withdrawal symptoms.  R.  Pt remains safe on unit, will continue to monitor.

## 2012-05-29 NOTE — Progress Notes (Signed)
[facility] Group Notes:  (Nursing/MHT/Case Management/Adjunct)  Date: 05/28/2012  Time:  2100  Type of Therapy:  wrap up group  Participation Level:  Active  Participation Quality:  Appropriate and Attentive  Affect:  Appropriate  Cognitive:  Appropriate  Insight:  Appropriate  Engagement in Group:  Engaged  Modes of Intervention:  Clarification, Education and Support  Summary of Progress/Problems:  Danielle Chandler 05/29/2012, 2:05 AM
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
## 2012-05-29 NOTE — Progress Notes (Signed)
D:  Patient denied SI and HI.   Denied A/V hallucinations.  Denied pain.  Stated she must leave early Monday morning to take care of her children.  Rated anxiety #5.  A:  Medications administered per MD order.  Support and encouragement given throughout day.   R:  Denied SI and HI.   Denied A/V hallucinations.  Contracts for safety.  Will continue to monitor for safety.

## 2012-05-29 NOTE — Progress Notes (Signed)
I certify that inpatient services furnished can reasonably be expected to improve the patient's condition. Diondre Pulis, MD, [facility]  

## 2012-05-29 NOTE — Progress Notes (Signed)
[facility] MD Progress Note  05/29/2012 11:31 AM Danielle Chandler  MRN:  [id number] Subjective:  Danielle Chandler eports that she is doing pretty well today. She is somewhat surprised at how good she feels on her third day post heroin. She reports that she is only mildly depressed, and rates her depression as a 1-2 on a scale of 1-10, 10 being the worst. She endorses a fair amount of anxiety, and rates it as a 4 on a scale of 1-10. She reports that she has little appetite, but she is snacking to try to a period she reports that she slept well until about 4 AM, then had to ask for something to help her get back to sleep. She denies any suicidal or homicidal ideation. She denies any auditory or visual hallucinations. She reports that she needs to be discharged tomorrow (Monday, 05/30/2012) because she has no one to care for her children. She plans to attend 12-step meetings to treat her addiction.  Diagnosis:   Axis I: Substance Abuse and Substance Induced Mood Disorder Axis II: Deferred Axis III:  Past Medical History  Diagnosis Date  . Headache   . Anxiety   . Drug use     ADL's:  Intact  Sleep: Fair  Appetite:  Poor  Suicidal Ideation:  Patient denies any thought, plan, or intent Homicidal Ideation:  Patient denies any thought, plan, or intent AEB (as evidenced by):  Psychiatric Specialty Exam: Review of Systems  Constitutional: Negative.   HENT: Negative.   Eyes: Negative.   Respiratory: Negative.   Cardiovascular: Negative.   Gastrointestinal: Negative.   Musculoskeletal: Negative.   Skin: Negative.   Endo/Heme/Allergies: Negative.   Psychiatric/Behavioral: Positive for depression and substance abuse. Negative for suicidal ideas and hallucinations. The patient is nervous/anxious and has insomnia.     Blood pressure 97/64, pulse 81, temperature 97.5 F (36.4 C), temperature source Oral, resp. rate 16, height 5\' 3"  (1.6 m), weight 83.915 kg (185 lb), last menstrual period 04/29/2012.Body mass  index is 32.78 kg/(m^2).  General Appearance: Casual  Eye Contact::  Good  Speech:  Clear and Coherent  Volume:  Normal  Mood:  Dysphoric  Affect:  Congruent  Thought Process:  Linear  Orientation:  Full (Time, Place, and Person)  Thought Content:  WDL  Suicidal Thoughts:  No  Homicidal Thoughts:  No  Memory:  Immediate;   Good Recent;   Good Remote;   Good  Judgement:  Fair  Insight:  Fair  Psychomotor Activity:  Normal  Concentration:  Good  Recall:  Good  Akathisia:  No  Handed:  Right  AIMS (if indicated):     Assets:  Communication Skills Desire for Improvement Resilience  Sleep:  Number of Hours: 5.25   Current Medications: Current Facility-Administered Medications  Medication Dose Route Frequency Provider Last Rate Last Dose  . acetaminophen (TYLENOL) tablet 650 mg  650 mg Oral Q6H PRN Sanjuana Kava, NP   650 mg at 05/29/12 1000  . alum & mag hydroxide-simeth (MAALOX/MYLANTA) 200-200-20 MG/5ML suspension 30 mL  30 mL Oral Q4H PRN Sanjuana Kava, NP      . cloNIDine (CATAPRES) tablet 0.1 mg  0.1 mg Oral QID Sanjuana Kava, NP   0.1 mg at 05/29/12 0819   Followed by  . [START ON 05/30/2012] cloNIDine (CATAPRES) tablet 0.1 mg  0.1 mg Oral BH-qamhs Sanjuana Kava, NP       Followed by  . [START ON 06/01/2012] cloNIDine (CATAPRES) tablet 0.1 mg  0.1 mg Oral QAC breakfast Sanjuana Kava, NP      . dicyclomine (BENTYL) tablet 20 mg  20 mg Oral Q6H PRN Sanjuana Kava, NP   20 mg at 05/29/12 0509  . hydrOXYzine (ATARAX/VISTARIL) tablet 25 mg  25 mg Oral Q6H PRN Sanjuana Kava, NP   25 mg at 05/29/12 0509  . loperamide (IMODIUM) capsule 2-4 mg  2-4 mg Oral PRN Sanjuana Kava, NP   4 mg at 05/28/12 1608  . magnesium hydroxide (MILK OF MAGNESIA) suspension 30 mL  30 mL Oral Daily PRN Sanjuana Kava, NP      . methocarbamol (ROBAXIN) tablet 500 mg  500 mg Oral Q8H PRN Sanjuana Kava, NP   500 mg at 05/29/12 0509  . naproxen (NAPROSYN) tablet 500 mg  500 mg Oral BID PRN Sanjuana Kava, NP    500 mg at 05/29/12 0509  . ondansetron (ZOFRAN-ODT) disintegrating tablet 4 mg  4 mg Oral Q6H PRN Sanjuana Kava, NP   4 mg at 05/29/12 0509  . traZODone (DESYREL) tablet 50 mg  50 mg Oral QHS,MR X 1 Sanjuana Kava, NP   50 mg at 05/28/12 2126    Lab Results:  Results for orders placed during the hospital encounter of 05/27/12 (from the past 48 hour(s))  ACETAMINOPHEN LEVEL     Status: None   Collection Time    05/27/12  5:13 PM      Result Value Range   Acetaminophen (Tylenol), Serum <15.0  10 - 30 ug/mL   Comment:            THERAPEUTIC CONCENTRATIONS VARY     SIGNIFICANTLY. A RANGE OF 10-30     ug/mL MAY BE AN EFFECTIVE     CONCENTRATION FOR MANY PATIENTS.     HOWEVER, SOME ARE BEST TREATED     AT CONCENTRATIONS OUTSIDE THIS     RANGE.     ACETAMINOPHEN CONCENTRATIONS     >150 ug/mL AT 4 HOURS AFTER     INGESTION AND >50 ug/mL AT 12     HOURS AFTER INGESTION ARE     OFTEN ASSOCIATED WITH TOXIC     REACTIONS.  CBC     Status: Abnormal   Collection Time    05/27/12  5:13 PM      Result Value Range   WBC 8.4  4.0 - 10.5 K/uL   RBC 4.61  3.87 - 5.11 MIL/uL   Hemoglobin 12.7  12.0 - 15.0 g/dL   HCT 47.8  29.5 - 62.1 %   MCV 86.1  78.0 - 100.0 fL   MCH 27.5  26.0 - 34.0 pg   MCHC 32.0  30.0 - 36.0 g/dL   RDW 30.8  65.7 - 84.6 %   Platelets 408 (*) 150 - 400 K/uL  COMPREHENSIVE METABOLIC PANEL     Status: Abnormal   Collection Time    05/27/12  5:13 PM      Result Value Range   Sodium 135  135 - 145 mEq/L   Potassium 4.2  3.5 - 5.1 mEq/L   Chloride 99  96 - 112 mEq/L   CO2 27  19 - 32 mEq/L   Glucose, Bld 94  70 - 99 mg/dL   BUN 10  6 - 23 mg/dL   Creatinine, Ser 9.62  0.50 - 1.10 mg/dL   Calcium 9.6  8.4 - 95.2 mg/dL   Total Protein 8.4 (*) 6.0 - 8.3 g/dL  Albumin 3.8  3.5 - 5.2 g/dL   AST 17  0 - 37 U/L   ALT 13  0 - 35 U/L   Alkaline Phosphatase 71  39 - 117 U/L   Total Bilirubin 0.5  0.3 - 1.2 mg/dL   GFR calc non Af Amer >90  >90 mL/min   GFR calc Af Amer >90   >90 mL/min   Comment:            The eGFR has been calculated     using the CKD EPI equation.     This calculation has not been     validated in all clinical     situations.     eGFR's persistently     <90 mL/min signify     possible Chronic Kidney Disease.  ETHANOL     Status: None   Collection Time    05/27/12  5:13 PM      Result Value Range   Alcohol, Ethyl (B) <11  0 - 11 mg/dL   Comment:            LOWEST DETECTABLE LIMIT FOR     SERUM ALCOHOL IS 11 mg/dL     FOR MEDICAL PURPOSES ONLY  SALICYLATE LEVEL     Status: Abnormal   Collection Time    05/27/12  5:13 PM      Result Value Range   Salicylate Lvl <2.0 (*) 2.8 - 20.0 mg/dL  URINE RAPID DRUG SCREEN (HOSP PERFORMED)     Status: Abnormal   Collection Time    05/27/12  5:46 PM      Result Value Range   Opiates POSITIVE (*) NONE DETECTED   Cocaine NONE DETECTED  NONE DETECTED   Benzodiazepines NONE DETECTED  NONE DETECTED   Amphetamines NONE DETECTED  NONE DETECTED   Tetrahydrocannabinol POSITIVE (*) NONE DETECTED   Barbiturates NONE DETECTED  NONE DETECTED   Comment:            DRUG SCREEN FOR MEDICAL PURPOSES     ONLY.  IF CONFIRMATION IS NEEDED     FOR ANY PURPOSE, NOTIFY LAB     WITHIN 5 DAYS.                LOWEST DETECTABLE LIMITS     FOR URINE DRUG SCREEN     Drug Class       Cutoff (ng/mL)     Amphetamine      1000     Barbiturate      200     Benzodiazepine   200     Tricyclics       300     Opiates          300     Cocaine          300     THC              50    Physical Findings: AIMS: Facial and Oral Movements Muscles of Facial Expression: None, normal Lips and Perioral Area: None, normal Jaw: None, normal Tongue: None, normal,Extremity Movements Upper (arms, wrists, hands, fingers): None, normal Lower (legs, knees, ankles, toes): None, normal, Trunk Movements Neck, shoulders, hips: None, normal, Overall Severity Severity of abnormal movements (highest score from questions above): None,  normal Incapacitation due to abnormal movements: None, normal Patient's awareness of abnormal movements (rate only patient's report): No Awareness, Dental Status Current problems with teeth and/or dentures?: No Does patient usually wear dentures?: No  CIWA:  CIWA-Ar Total: 2 COWS:  COWS Total Score: 6  Treatment Plan Summary: Daily contact with patient to assess and evaluate symptoms and progress in treatment Medication management  Plan: We will continue her current plan of care, and research possible outpatient care options.  Medical Decision Making Problem Points:  Established problem, stable/improving (1), Review of last therapy session (1) and Review of psycho-social stressors (1) Data Points:  Review and summation of old records (2) Review of medication regiment & side effects (2)  I certify that inpatient services furnished can reasonably be expected to improve the patient's condition.   Shaniqua Guillot 05/29/2012, 11:31 AM

## 2012-05-29 NOTE — Clinical Social Work Note (Signed)
[facility] Group Notes:  (Clinical Social Work)  05/29/2012  10:00-11:00AM  Summary of Progress/Problems:   The main focus of today's process group was to   identify the patient's current support system and decide on other supports that can be put in place to prevent future hospitalizations.  A handout was used to explain the 4 definitions/levels of support and to think about what support patient has given and received from others.  An emphasis was placed on using counselor, doctor, therapy groups, 12-step groups, and problem-specific support groups to expand supports. The patient expressed that she has been supported by her family to the point of them enabling her.  Her mother is taking the day off work Monday, but patient stated she MUST be discharged by Monday in order to take care of the children Tuesday.  Type of Therapy:  Process Group with Motivational Interviewing  Participation Level:  Active  Participation Quality:  Attentive and Sharing  Affect:  Blunted  Cognitive:  Appropriate and Oriented  Insight:  Developing/Improving  Engagement in Therapy:  Developing/Improving  Modes of Intervention:  Clarification, Education, Limit-setting, Problem-solving, Socialization, Support and Processing, Exploration, Discussion, Role-Play   Ambrose Mantle, LCSW 05/29/2012, 12:53 PM

## 2012-05-30 DIAGNOSIS — F1120 Opioid dependence, uncomplicated: Principal | ICD-10-CM

## 2012-05-30 LAB — POCT PREGNANCY, URINE: Preg Test, Ur: NEGATIVE

## 2012-05-30 MED ORDER — TRAZODONE HCL 50 MG PO TABS: 50.0000 mg | ORAL_TABLET | Freq: Every evening | ORAL | Status: DC | PRN

## 2012-05-30 MED ORDER — ACETAMINOPHEN 500 MG PO TABS: 1000.0000 mg | ORAL_TABLET | Freq: Four times a day (QID) | ORAL | Status: DC | PRN

## 2012-05-30 NOTE — Progress Notes (Signed)
Adult Psychoeducational Group Note  Date:  05/30/2012 Time: 11:00AM Group Topic/Focus:  Self Care:   The focus of this group is to help patients understand the importance of self-care in order to improve or restore emotional, physical, spiritual, interpersonal, and financial health.  Participation Level:  Active  Participation Quality:  Appropriate and Attentive  Affect:  Appropriate  Cognitive:  Alert and Appropriate  Insight: Appropriate  Engagement in Group:  Engaged  Modes of Intervention:  Discussion  Additional Comments:  Pt. Was appropriate and attentive during today's group discussion. Pt. Was able to discuss and Self Care containing to Wellness. Pt. Shared that she enjoys massages, Pedicures, and manicures. However she always dont make the best choices in the people she hang around with.   Bing Plume D 05/30/2012, 1:44 PM

## 2012-05-30 NOTE — Tx Team (Signed)
Interdisciplinary Treatment Plan Update (Adult)  Date: 05/30/2012  Time Reviewed: 10:06 AM   Progress in Treatment: Attending groups: Yes Participating in groups: Yes Taking medication as prescribed:  Yes Tolerating medication:  Yes Family/Significant othe contact made: No Patient understands diagnosis: Yes Discussing patient identified problems/goals with staff: Yes Medical problems stabilized or resolved:  Yes Denies suicidal/homicidal ideation: Yes Patient has not harmed self or Others: Yes  New problem(s) identified: None Identified  Discharge Plan or Barriers:  Patient to discharge home and follow up at [facility]  Additional comments: N/A  Reason for Continuation of Hospitalization: NA  Estimated length of stay: Disharge today  For review of initial/current patient goals, please see plan of care.  Attendees: Patient:     Family:     Physician:  Geoffery Lyons 05/30/2012 10:06 AM   Nursing:   Roswell Miners, RN 05/30/2012 10:06 AM   Clinical Social Worker Ronda Fairly 05/30/2012 10:06 AM   Other:  Robbie Louis, RN 05/30/2012 10:06 AM   Other:  Serena Colonel, NP 05/30/2012 10:06 AM   Other:     Other:      Scribe for Treatment Team:   Carney Bern, LCSWA  05/30/2012 10:06 AM

## 2012-05-30 NOTE — Discharge Summary (Signed)
Physician Discharge Summary Note  Patient:  Danielle Chandler is an 27 y.o., female MRN:  [id number] DOB:  1986/01/20 Patient phone:  [phone] (home)  Patient address:   [address] [place] [address],   Date of Admission:  05/28/2012 Date of Discharge: 05/30/12  Reason for Admission:  Opioid withdrawal symptoms  Discharge Diagnoses: Principal Problem:   Heroin dependence  Review of Systems  Constitutional: Negative.   HENT: Negative.   Eyes: Negative.   Respiratory: Negative.   Cardiovascular: Negative.   Gastrointestinal: Negative.   Genitourinary: Negative.   Musculoskeletal: Negative.   Skin: Negative.   Neurological: Negative.   Endo/Heme/Allergies: Negative.   Psychiatric/Behavioral: Positive for substance abuse (Hx Opioid dependence). Negative for depression, suicidal ideas, hallucinations and memory loss. The patient is nervous/anxious and has insomnia (Stabilized with medication prior to discharge).    Axis Diagnosis:   AXIS I:  Heroin dependence AXIS II:  Deferred AXIS III:   Past Medical History  Diagnosis Date  . Headache   . Anxiety   . Drug use    AXIS IV:  other psychosocial or environmental problems and Substance abuse issues AXIS V:  64  Level of Care:  OP  Hospital Course: Danielle Chandler is a 27 y.o. female presenting to [facility] for detox from heroin. Pt denies SI/HI/Psych. Pt also admits to using THC. Pt repots the the following: pt uses 2 grams daily, last use was 05/27/12, states she had a "20". Pt is shooting in bilateral arms with moderate bruising on right arm. Pt tells this Clinical research associate that she began snorting heroin 2 yrs ago and then started shooting last year. Pt states that she funds her habit by stealing and has 2 legal charges--Shoplifting, court dates 06/07/12 and 07/26/12. Pt uses THC wkly--1 joint, last use was 05/26/12. Pt has no prior detox history. Pt c/o w/d sxs: chills, body aches, watery eyes, runny nose, dry throat, headache,  itchy skin. No hx of seizures or blackouts.  Upon admission into this hospital, and after admission assessment and evaluation, it was determined that, [name] will need detoxification treatment protocol to stabilize her systems from drug intoxication and to combat the withdrawal symptoms as well. Her UDS was (+) for opiates and THC. She was then started on clonidine protocol for her opiate detoxification. She was also enrolled in group counseling sessions and activities to learn coping skills that should help her after discharge to cope better, and manage her substance abuse issues for a much sustained sobriety. She was also enrolled/attended AA/NA meetings being offered and held on this unit. She has no other previous and or identifiable medical conditions that required treatment and or monitoring. However, she was monitored closely for any potential problems that may arise as a result of and or during detoxification treatment. Patient tolerated her detoxification treatment without any significant adverse effects and or reactions presented.  Patient attended treatment team meeting this am and met with the treatment team members. Her reason for admission, present symptoms, substance abuse issues, response to treatment and discharge plans discussed. Patient endorsed that she is doing well and stable for discharge to pursue the next phase of her substance abuse treatment. She will continue psychiatric care on outpatient basis at [facility] here in [place], [place] on Thursday 06/02/12 at 09:00 am. Patient is instructed that this is a walk-in appointment and should make every effort to this appointment timely to be seen ASAP.  [name] is currently being discharged to her home. Upon discharge,  patient adamantly denies suicidal, homicidal ideations, auditory, visual hallucinations, delusional thinking and or withdrawal symptoms. Patient left [facility] with all personal belongings in no apparent distress, Transportation per  family. She received from [facility] a 2 weeks worth supply samples of her discharged medications.  Consults:  None  Significant Diagnostic Studies:  labs: CBC with diff, CMP, UDS, Toxicology tests  Discharge Vitals:   Blood pressure 121/66, pulse 96, temperature 97.3 F (36.3 C), temperature source Oral, resp. rate 18, height 5\' 3"  (1.6 m), weight 83.915 kg (185 lb), last menstrual period 04/29/2012. Body mass index is 32.78 kg/(m^2). Lab Results:   No results found for this or any previous visit (from the past 72 hour(s)).  Physical Findings: AIMS: Facial and Oral Movements Muscles of Facial Expression: None, normal Lips and Perioral Area: None, normal Jaw: None, normal Tongue: None, normal,Extremity Movements Upper (arms, wrists, hands, fingers): None, normal Lower (legs, knees, ankles, toes): None, normal, Trunk Movements Neck, shoulders, hips: None, normal, Overall Severity Severity of abnormal movements (highest score from questions above): None, normal Incapacitation due to abnormal movements: None, normal Patient's awareness of abnormal movements (rate only patient's report): No Awareness, Dental Status Current problems with teeth and/or dentures?: No Does patient usually wear dentures?: No  CIWA:  CIWA-Ar Total: 5 COWS:  COWS Total Score: 5  Psychiatric Specialty Exam: See Psychiatric Specialty Exam and Suicide Risk Assessment completed by Attending Physician prior to discharge.  Discharge destination:  Home  Is patient on multiple antipsychotic therapies at discharge:  No   Has Patient had three or more failed trials of antipsychotic monotherapy by history:  No  Recommended Plan for Multiple Antipsychotic Therapies: NA     Medication List    STOP taking these medications       ALPRAZolam 1 MG tablet  Commonly known as:  XANAX     ibuprofen 600 MG tablet  Commonly known as:  ADVIL,MOTRIN      TAKE these medications     Indication   acetaminophen 500 MG tablet   Commonly known as:  TYLENOL  Take 2 tablets (1,000 mg total) by mouth every 6 (six) hours as needed. For pain   Indication:  Fever, Pain     traZODone 50 MG tablet  Commonly known as:  DESYREL  Take 1 tablet (50 mg total) by mouth at bedtime and may repeat dose one time if needed. For depression/sleep   Indication:  Trouble Sleeping, Major Depressive Disorder       Follow-up Information   Follow up with [facility]  On 06/02/2012. (Follow up at [facility]; go to walkin clinic Thursday AM by 9:00AM and you will be seen asap)    Contact information:   [address] [place] [address] [facility] [phone] Fax [phone]     Follow-up recommendations:  Activity:  As tolerated Diet: As recommended by your primary care doctor. Keep all scheduled follow-up appointments as recommended. Continue to work the relapse prevention plan Comments:  Take all your medications as prescribed by your mental healthcare provider. Report any adverse effects and or reactions from your medicines to your outpatient provider promptly. Patient is instructed and cautioned to not engage in alcohol and or illegal drug use while on prescription medicines. In the event of worsening symptoms, patient is instructed to call the crisis hotline, 911 and or go to the nearest ED for appropriate evaluation and treatment of symptoms. Follow-up with your primary care provider for your other medical issues, concerns and or health care  needs.   Total Discharge Time:  Greater than 30 minutes.  SignedArmandina Stammer I 05/31/2012, 1:26 PM

## 2012-05-30 NOTE — Progress Notes (Signed)
Adult Psychoeducational Group Note  Date:  05/30/2012 Time:  11:07 AM  Group Topic/Focus:  Personal Choices and Values:   The focus of this group is to help patients assess and explore the importance of values in their lives, how their values affect their decisions, how they express their values and what opposes their expression.  Participation Level:  Active  Participation Quality:  Appropriate  Affect:  Appropriate  Cognitive:  Alert and Appropriate  Insight: Good  Engagement in Group:  Engaged  Modes of Intervention:  Activity and Discussion  Additional Comments:  Pt was in group and participated. Pt is here to work on issues with substance abuse.  Garvey Westcott T 05/30/2012, 11:07 AM

## 2012-05-30 NOTE — Progress Notes (Signed)
[facility] Adult Case Management Discharge Plan :  Will you be returning to the same living situation after discharge: Yes home At discharge, do you have transportation home?:Yes Mother Do you have the ability to pay for your medications:Yes through [facility]  Release of information consent forms completed and in the chart;  Patient's signature needed at discharge.  Patient to Follow up at: Follow-up Information   Follow up with [facility]  On 06/02/2012. (Follow up at [facility]; go to walkin clinic Thursday AM by 9:00AM and you will be seen asap)    Contact information:   [address] [place] [address] [facility] [phone] Fax [phone]      Patient denies SI/HI:  Denies Bother   Safety Planning and Suicide Prevention discussed:  Not Applicable  Clide Dales

## 2012-05-30 NOTE — [facility] Suicide Risk Assessment (Signed)
Suicide Risk Assessment  Discharge Assessment     Demographic Factors:  Adolescent or young adult  Mental Status Per Nursing Assessment::   On Admission:  NA  Current Mental Status by Physician: In full contact with reality. There are no suicidal ideas, plans or intent. Her mood is euthymic, her affect is appropriate. She is willing and motivated to work on long term abstinence   Loss Factors: NA  Historical Factors: NA  Risk Reduction Factors:   Responsible for children under 68 years of age, Sense of responsibility to family and Positive social support  Continued Clinical Symptoms:  Alcohol/Substance Abuse/Dependencies  Cognitive Features That Contribute To Risk: None identified    Suicide Risk:  Minimal: No identifiable suicidal ideation.  Patients presenting with no risk factors but with morbid ruminations; may be classified as minimal risk based on the severity of the depressive symptoms  Discharge Diagnoses:   AXIS I:  Opioid Dependence,S/P withdrawal, Substance induced Mood Disorder AXIS II:  No diagnosis AXIS III:   Past Medical History  Diagnosis Date  . Headache   . Anxiety   . Drug use    AXIS IV:  other psychosocial or environmental problems AXIS V:  61-70 mild symptoms  Plan Of Care/Follow-up recommendations:  Activity:  As tolerated Diet:  regular Will follow up outpatient basis Is patient on multiple antipsychotic therapies at discharge:  No   Has Patient had three or more failed trials of antipsychotic monotherapy by history:  No  Recommended Plan for Multiple Antipsychotic Therapies: N/A   Danielle Chandler A 05/30/2012, 12:40 PM

## 2012-05-30 NOTE — Progress Notes (Signed)
[facility] LCSW Group Therapy  05/30/2012 1:45 PM  Type of Therapy: Group Therapy   Participation Level:    Participation Quality:  Appropriate  Affect:  Flat  Cognitive: Alert and Oriented  Insight: Improving  Engagement in Therapy:  Improving  Modes of Intervention:  Exploration, Discussion, Support and Socialization   Summary of Progress/Problems:  Group discussion focused on what patient's see as their own obstacles to recovery.  Patient shares belief that "making a decision (as to whether or not to use) every day" will be difficult based on her past experiences.  Others in group suggest she 'focus on just not using no matter what.'  Patient was challenged by idea but able to process that "this would definitely have better results."  Danielle Chandler, Julious Payer

## 2012-05-30 NOTE — Progress Notes (Signed)
Adult Psychoeducational Group Note  Date:   05/28/2012 Time:  02:15PM  Group Topic/Focus:  Goals Group:   The focus of this group is to help patients establish daily goals to achieve during treatment and discuss how the patient can incorporate goal setting into their daily lives to aide in recovery.  Participation Level:  Did Not Attend  Participation Quality:  Resistant  Affect:  Resistant  Cognitive:  Lacking  Insight: Lacking  Engagement in Group:  Resistant  Modes of Intervention:  Clarification, Discussion, Education, Exploration, Problem-solving, Socialization and Support  Additional Comments:Pt. refused to attend.  Pixie Casino [facility] 05/28/2012, 03:30PM

## 2012-05-30 NOTE — Progress Notes (Signed)
Patient ID: Danielle Chandler, female   DOB: 29-Mar-1985, 27 y.o.   MRN: [id number] She has been discharged home and was picked up by  Her mother. She voiced understanding of discharge instruction and of follow up plan. She denies thoughts of SI and all her belongings were taken home with her. She stated she had to go home and care for her children.

## 2012-05-30 NOTE — Progress Notes (Signed)
[facility] LCSW Aftercare Discharge Planning Group Note  05/30/2012   Participation Quality: Minimal  Affect: Hesitant  Cognitive:  Alert and oriented  Insight: Limited  Engagement in Group: Limited  Modes of Intervention:  Exploration, Clarification and Support  Summary of Progress/Problems:  Pt denies both suicidal and homicidal ideation.  On a scale of 1 to 10 with ten being the most ever experienced, the patient rates depression at a 0 and anxiety at a 4. Patient reports she wants treatment for heroin use yet is not willing to go for inpatient treatment nor an intensive IOP program.  Patient is willing to follow up for medication management and therapy at [facility] and go to AA & NA area meetings.    Clide Dales

## 2012-06-01 NOTE — ED Provider Notes (Signed)
Medical screening examination/treatment/procedure(s) were performed by non-physician practitioner and as supervising physician I was immediately available for consultation/collaboration.  Raeford Razor, MD 06/01/12 1309

## 2012-06-03 NOTE — Progress Notes (Signed)
Patient Discharge Instructions:  After Visit Summary (AVS):   Faxed to:  06/03/12 Discharge Summary Note:   Faxed to:  06/03/12 Psychiatric Admission Assessment Note:   Faxed to:  06/03/12 Suicide Risk Assessment - Discharge Assessment:   Faxed to:  06/03/12 Faxed/Sent to the Next Level Care provider:  06/03/12 Faxed to [facility] @ [phone]  Jerelene Redden, 06/03/2012, 2:58 PM

## 2012-07-09 ENCOUNTER — Encounter (HOSPITAL_COMMUNITY): Payer: Self-pay | Admitting: Emergency Medicine

## 2012-07-09 ENCOUNTER — Inpatient Hospital Stay (HOSPITAL_COMMUNITY)
Admission: EM | Admit: 2012-07-09 | Discharge: 2012-07-19 | DRG: 193 | Disposition: A | Discharge status: Home / Self Care | Payer: Medicaid Other | Attending: Internal Medicine | Admitting: Internal Medicine

## 2012-07-09 DIAGNOSIS — J189 Pneumonia, unspecified organism: Secondary | ICD-10-CM

## 2012-07-09 DIAGNOSIS — F1910 Other psychoactive substance abuse, uncomplicated: Secondary | ICD-10-CM | POA: Diagnosis present

## 2012-07-09 DIAGNOSIS — F1120 Opioid dependence, uncomplicated: Secondary | ICD-10-CM

## 2012-07-09 DIAGNOSIS — J918 Pleural effusion in other conditions classified elsewhere: Secondary | ICD-10-CM

## 2012-07-09 DIAGNOSIS — F17200 Nicotine dependence, unspecified, uncomplicated: Secondary | ICD-10-CM | POA: Diagnosis present

## 2012-07-09 DIAGNOSIS — D509 Iron deficiency anemia, unspecified: Secondary | ICD-10-CM | POA: Diagnosis present

## 2012-07-09 DIAGNOSIS — R000 Tachycardia, unspecified: Secondary | ICD-10-CM | POA: Diagnosis present

## 2012-07-09 DIAGNOSIS — N83209 Unspecified ovarian cyst, unspecified side: Secondary | ICD-10-CM | POA: Diagnosis present

## 2012-07-09 DIAGNOSIS — F1210 Cannabis abuse, uncomplicated: Secondary | ICD-10-CM | POA: Diagnosis present

## 2012-07-09 DIAGNOSIS — F1920 Other psychoactive substance dependence, uncomplicated: Secondary | ICD-10-CM

## 2012-07-09 DIAGNOSIS — R079 Chest pain, unspecified: Secondary | ICD-10-CM

## 2012-07-09 DIAGNOSIS — F329 Major depressive disorder, single episode, unspecified: Secondary | ICD-10-CM

## 2012-07-09 DIAGNOSIS — J9600 Acute respiratory failure, unspecified whether with hypoxia or hypercapnia: Secondary | ICD-10-CM | POA: Diagnosis present

## 2012-07-09 NOTE — ED Notes (Signed)
Pt reports generalized back pain, initially stating that it was in the right upper portion of her back, then stating "it's just all over."  Reports being diagnosed with pneumonia yesterday at [facility], as well as diagnosed with UTI at both [facility] yesterday and [facility] 2 days ago.  Pt stating that she was prescribed Zithromax and Bactrim for UTI and pneumonia.  Also reports prescribed Percocet for pain, but "it isn't enough."  Pt stated that last dose she took was about 1pm and "I took two of them and it didn't do anything."  Reinforced with pt that medications are most effective if taken as prescribed.  Pt became angry and stated "I'm not making this up!"  Again, reinforced with pt that I didn't believe she was making up a story, but that she should continue to take medications as prescribed for most effective benefit.

## 2012-07-09 NOTE — ED Notes (Signed)
Pt demanding medications for pain, as well as Gatorade.  Explained to pt that nothing can be administered until the physician sees and evaluates her.  Pt rests quietly with easy, regular respirations, until staff make presence known and then respirations become labored and pt appears to be having increased signs of pain.

## 2012-07-09 NOTE — ED Notes (Signed)
Patient c/o back pain that radiates into the front.  Has been seen at [facility] and [facility]; diagnosed with UTI and pneumonia.  Patient states has been getting worse.

## 2012-07-10 ENCOUNTER — Emergency Department (HOSPITAL_COMMUNITY): Payer: Medicaid Other

## 2012-07-10 DIAGNOSIS — J189 Pneumonia, unspecified organism: Secondary | ICD-10-CM | POA: Diagnosis present

## 2012-07-10 DIAGNOSIS — R079 Chest pain, unspecified: Secondary | ICD-10-CM | POA: Diagnosis present

## 2012-07-10 DIAGNOSIS — F1920 Other psychoactive substance dependence, uncomplicated: Secondary | ICD-10-CM | POA: Diagnosis present

## 2012-07-10 DIAGNOSIS — F329 Major depressive disorder, single episode, unspecified: Secondary | ICD-10-CM | POA: Diagnosis present

## 2012-07-10 DIAGNOSIS — F1910 Other psychoactive substance abuse, uncomplicated: Secondary | ICD-10-CM | POA: Diagnosis present

## 2012-07-10 LAB — CBC WITH DIFFERENTIAL/PLATELET
Basophils Relative: 0 % (ref 0–1)
Hemoglobin: 11.8 g/dL — ABNORMAL LOW (ref 12.0–15.0)
MCHC: 33.2 g/dL (ref 30.0–36.0)
Monocytes Relative: 6 % (ref 3–12)
Neutro Abs: 11.1 10*3/uL — ABNORMAL HIGH (ref 1.7–7.7)
Neutrophils Relative %: 83 % — ABNORMAL HIGH (ref 43–77)
Platelets: 308 10*3/uL (ref 150–400)
RBC: 4.12 MIL/uL (ref 3.87–5.11)

## 2012-07-10 LAB — BASIC METABOLIC PANEL
BUN: 9 mg/dL (ref 6–23)
Chloride: 96 mEq/L (ref 96–112)
GFR calc Af Amer: >90 mL/min (ref >90)
Potassium: 4.2 mEq/L (ref 3.5–5.1)
Sodium: 131 mEq/L — ABNORMAL LOW (ref 135–145)

## 2012-07-10 LAB — RAPID URINE DRUG SCREEN, HOSP PERFORMED
Amphetamines: NOT DETECTED
Barbiturates: NOT DETECTED
Tetrahydrocannabinol: POSITIVE — AB

## 2012-07-10 LAB — HIV ANTIBODY (ROUTINE TESTING W REFLEX): HIV: NONREACTIVE

## 2012-07-10 LAB — URINALYSIS, ROUTINE W REFLEX MICROSCOPIC
Ketones, ur: 15 mg/dL — AB
Leukocytes, UA: NEGATIVE
Nitrite: NEGATIVE
Specific Gravity, Urine: >1.03 — ABNORMAL HIGH (ref 1.005–1.030)
pH: 6 (ref 5.0–8.0)

## 2012-07-10 LAB — PREGNANCY, URINE: Preg Test, Ur: NEGATIVE

## 2012-07-10 LAB — HEPATIC FUNCTION PANEL
AST: 17 U/L (ref 0–37)
Albumin: 3.4 g/dL — ABNORMAL LOW (ref 3.5–5.2)
Total Bilirubin: 0.4 mg/dL (ref 0.3–1.2)
Total Protein: 8.3 g/dL (ref 6.0–8.3)

## 2012-07-10 LAB — POCT PREGNANCY, URINE: Preg Test, Ur: NEGATIVE

## 2012-07-10 MED ORDER — SODIUM CHLORIDE 0.9 % IV BOLUS (SEPSIS)
1000.0000 mL | Freq: Once | INTRAVENOUS | Status: AC
  Administered 2012-07-10: 1000 mL via INTRAVENOUS

## 2012-07-10 MED ORDER — ENOXAPARIN SODIUM 40 MG/0.4ML ~~LOC~~ SOLN: 40.0000 mg | SUBCUTANEOUS | Status: DC

## 2012-07-10 MED ORDER — DEXTROSE 5 % IV SOLN
1.0000 g | Freq: Once | INTRAVENOUS | Status: AC
  Administered 2012-07-10: 1 g via INTRAVENOUS
  Filled 2012-07-10: qty 10

## 2012-07-10 MED ORDER — MUPIROCIN 2 % EX OINT
1.0000 "application " | TOPICAL_OINTMENT | Freq: Two times a day (BID) | CUTANEOUS | Status: AC
  Administered 2012-07-10 – 2012-07-14 (×10): 1 via NASAL
  Filled 2012-07-10 (×2): qty 22

## 2012-07-10 MED ORDER — GUAIFENESIN ER 600 MG PO TB12
1200.0000 mg | ORAL_TABLET | Freq: Two times a day (BID) | ORAL | Status: DC
  Administered 2012-07-10 – 2012-07-19 (×19): 1200 mg via ORAL
  Filled 2012-07-10 (×21): qty 2

## 2012-07-10 MED ORDER — IPRATROPIUM BROMIDE 0.02 % IN SOLN
0.5000 mg | RESPIRATORY_TRACT | Status: DC | PRN
  Administered 2012-07-10 – 2012-07-11 (×2): 0.5 mg via RESPIRATORY_TRACT
  Filled 2012-07-10: qty 2.5

## 2012-07-10 MED ORDER — HYDROMORPHONE HCL PF 1 MG/ML IJ SOLN
0.5000 mg | INTRAMUSCULAR | Status: DC | PRN
  Administered 2012-07-10 (×2): 0.5 mg via INTRAVENOUS
  Filled 2012-07-10 (×2): qty 1

## 2012-07-10 MED ORDER — ALBUTEROL SULFATE (5 MG/ML) 0.5% IN NEBU
2.5000 mg | INHALATION_SOLUTION | RESPIRATORY_TRACT | Status: DC | PRN
  Administered 2012-07-11: 2.5 mg via RESPIRATORY_TRACT
  Filled 2012-07-10: qty 0.5

## 2012-07-10 MED ORDER — HYDROMORPHONE HCL PF 1 MG/ML IJ SOLN: 1.0000 mg | INTRAMUSCULAR | Status: DC | PRN

## 2012-07-10 MED ORDER — DEXTROSE 5 % IV SOLN
1.0000 g | INTRAVENOUS | Status: DC
  Administered 2012-07-11: 1 g via INTRAVENOUS
  Filled 2012-07-10: qty 10

## 2012-07-10 MED ORDER — KETOROLAC TROMETHAMINE 30 MG/ML IJ SOLN
30.0000 mg | Freq: Four times a day (QID) | INTRAMUSCULAR | Status: AC
  Administered 2012-07-10 – 2012-07-12 (×7): 30 mg via INTRAVENOUS
  Filled 2012-07-10 (×8): qty 1

## 2012-07-10 MED ORDER — ALBUTEROL SULFATE (5 MG/ML) 0.5% IN NEBU
INHALATION_SOLUTION | RESPIRATORY_TRACT | Status: AC
  Filled 2012-07-10: qty 0.5

## 2012-07-10 MED ORDER — CHLORHEXIDINE GLUCONATE CLOTH 2 % EX PADS
6.0000 | MEDICATED_PAD | Freq: Every day | CUTANEOUS | Status: AC
  Administered 2012-07-10 – 2012-07-14 (×5): 6 via TOPICAL

## 2012-07-10 MED ORDER — SODIUM CHLORIDE 0.9 % IV SOLN: INTRAVENOUS | Status: DC

## 2012-07-10 MED ORDER — KETOROLAC TROMETHAMINE 30 MG/ML IJ SOLN
30.0000 mg | Freq: Once | INTRAMUSCULAR | Status: AC
  Administered 2012-07-10: 30 mg via INTRAVENOUS
  Filled 2012-07-10: qty 1

## 2012-07-10 MED ORDER — ONDANSETRON HCL 4 MG/2ML IJ SOLN
4.0000 mg | Freq: Four times a day (QID) | INTRAMUSCULAR | Status: DC | PRN
  Administered 2012-07-13 – 2012-07-15 (×3): 4 mg via INTRAVENOUS
  Filled 2012-07-10 (×3): qty 2

## 2012-07-10 MED ORDER — SODIUM CHLORIDE 0.9 % IV SOLN
INTRAVENOUS | Status: DC
  Administered 2012-07-10: 15:00:00 via INTRAVENOUS

## 2012-07-10 MED ORDER — ALBUTEROL SULFATE (5 MG/ML) 0.5% IN NEBU
2.5000 mg | INHALATION_SOLUTION | RESPIRATORY_TRACT | Status: DC | PRN
  Administered 2012-07-10: 2.5 mg via RESPIRATORY_TRACT
  Filled 2012-07-10: qty 0.5

## 2012-07-10 MED ORDER — ENOXAPARIN SODIUM 40 MG/0.4ML ~~LOC~~ SOLN
40.0000 mg | SUBCUTANEOUS | Status: DC
  Administered 2012-07-10 – 2012-07-18 (×8): 40 mg via SUBCUTANEOUS
  Filled 2012-07-10 (×10): qty 0.4

## 2012-07-10 MED ORDER — HYDROCODONE-ACETAMINOPHEN 10-325 MG PO TABS
1.0000 | ORAL_TABLET | ORAL | Status: DC | PRN
  Administered 2012-07-10: 1 via ORAL
  Filled 2012-07-10: qty 1

## 2012-07-10 MED ORDER — LIDOCAINE 5 % EX PTCH
MEDICATED_PATCH | CUTANEOUS | Status: AC
  Filled 2012-07-10: qty 1

## 2012-07-10 MED ORDER — IOHEXOL 350 MG/ML SOLN
100.0000 mL | Freq: Once | INTRAVENOUS | Status: AC | PRN
  Administered 2012-07-10: 100 mL via INTRAVENOUS

## 2012-07-10 MED ORDER — LIDOCAINE 5 % EX PTCH
1.0000 | MEDICATED_PATCH | CUTANEOUS | Status: DC
  Administered 2012-07-11 – 2012-07-18 (×8): 1 via TRANSDERMAL
  Filled 2012-07-10 (×10): qty 1

## 2012-07-10 MED ORDER — DEXTROSE 5 % IV SOLN
500.0000 mg | INTRAVENOUS | Status: DC
  Administered 2012-07-12 – 2012-07-13 (×2): 500 mg via INTRAVENOUS
  Filled 2012-07-10 (×4): qty 500

## 2012-07-10 MED ORDER — VANCOMYCIN HCL IN DEXTROSE 1-5 GM/200ML-% IV SOLN
1000.0000 mg | Freq: Three times a day (TID) | INTRAVENOUS | Status: DC
  Administered 2012-07-10 – 2012-07-13 (×9): 1000 mg via INTRAVENOUS
  Filled 2012-07-10 (×12): qty 200

## 2012-07-10 MED ORDER — CLONIDINE HCL 0.1 MG/24HR TD PTWK
0.1000 mg | MEDICATED_PATCH | TRANSDERMAL | Status: DC
  Administered 2012-07-10: 0.1 mg via TRANSDERMAL
  Filled 2012-07-10: qty 1

## 2012-07-10 MED ORDER — KETOROLAC TROMETHAMINE 30 MG/ML IJ SOLN
30.0000 mg | Freq: Four times a day (QID) | INTRAMUSCULAR | Status: DC | PRN
  Administered 2012-07-10: 30 mg via INTRAVENOUS
  Filled 2012-07-10: qty 1

## 2012-07-10 MED ORDER — HYDROMORPHONE HCL PF 1 MG/ML IJ SOLN
1.0000 mg | Freq: Once | INTRAMUSCULAR | Status: AC
  Administered 2012-07-10: 1 mg via INTRAVENOUS
  Filled 2012-07-10: qty 1

## 2012-07-10 MED ORDER — ONDANSETRON HCL 4 MG/2ML IJ SOLN
4.0000 mg | Freq: Once | INTRAMUSCULAR | Status: AC
  Administered 2012-07-10: 4 mg via INTRAVENOUS
  Filled 2012-07-10: qty 2

## 2012-07-10 MED ORDER — METHADONE HCL 10 MG PO TABS
10.0000 mg | ORAL_TABLET | Freq: Three times a day (TID) | ORAL | Status: DC
  Administered 2012-07-10 – 2012-07-11 (×3): 10 mg via ORAL
  Filled 2012-07-10 (×3): qty 1

## 2012-07-10 MED ORDER — IPRATROPIUM BROMIDE 0.02 % IN SOLN
RESPIRATORY_TRACT | Status: AC
  Filled 2012-07-10: qty 2.5

## 2012-07-10 MED ORDER — ALBUTEROL SULFATE (5 MG/ML) 0.5% IN NEBU
INHALATION_SOLUTION | RESPIRATORY_TRACT | Status: AC
  Administered 2012-07-10: 2.5 mg
  Filled 2012-07-10: qty 0.5

## 2012-07-10 MED ORDER — THIAMINE HCL 100 MG/ML IJ SOLN
Freq: Once | INTRAVENOUS | Status: DC
  Filled 2012-07-10: qty 1000

## 2012-07-10 MED ORDER — HYDROMORPHONE HCL PF 1 MG/ML IJ SOLN
1.0000 mg | INTRAMUSCULAR | Status: DC | PRN
  Administered 2012-07-10: 1 mg via INTRAVENOUS
  Filled 2012-07-10: qty 1

## 2012-07-10 MED ORDER — LORAZEPAM 1 MG PO TABS
1.0000 mg | ORAL_TABLET | Freq: Every day | ORAL | Status: DC
Start: 2012-07-10 — End: 2012-07-19
  Administered 2012-07-10 – 2012-07-18 (×9): 1 mg via ORAL
  Filled 2012-07-10 (×6): qty 1
  Filled 2012-07-10: qty 2
  Filled 2012-07-10: qty 1

## 2012-07-10 NOTE — Progress Notes (Addendum)
[facility] Night Coverage Progress Note  Issues and concerns regarding patients inappropriate behavior discussed with nursing staff this PM. Changed to stepdown status due to signs of withdrawal. Patient complains of pain out of proportion to her clinical findings. She has a long history of polysubstance abuse including cocaine, heroin, benzodiazepines and oral opiates. 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   Plan as follows:  1. Patient will be treated aggressively for her PNA, which appears to be improving, she may be able to discharge on a home antibiotic regimen soon. Awaiting sputum sample. 2. Will first utilize non-opiate pain modalities including application of a lidoderm patch to her right rib cage, continue scheduled Toradol. 3. Patient will NOT receive IV opiate pain medication in the absence of a clear significant injury or indication. PNA pleurisy does not typically require high and frequent doses of opiates. Her Dilaudid has been discontinued. 4. Will not allow her to go into heroin/opiate withdrawal during her hospitalization by using oral methadone only. Will dose 20mg  X1 loading followed by 10mg  TID and escalate if she develops signs of withdrawal. She can be prescribed a rapid taper at discharge or be offered a referral to a methadone treatment program. 5. She reports that she is prescribed Xanax by Dr. Evelene Croon her psychiatrist-this is not evident in medical records or on the [place] prescription drug monitoring program record, but her UDS is positive for benzodiazapiones. Will give her QHS dose of Ativan for sleep and for seizure prevention. 6. I have requested that she not take any of her own medicatons or those of her  visitors.  Will attempt to have a therapeutic alliance, but if she is not able to cooperate so that we can treat her PNA safely I have offered discharge home on oral antibiotics.   Orders placed for comprehensive drugs of abuse screen to determine if she is actively using/abusing heroin or synthetic opiates.  Anderson Malta, DO Internal Medicine

## 2012-07-10 NOTE — Progress Notes (Addendum)
I noticed pt going through her purse, when I entered the room, pt had medication bottles in her lap, attempting to hide them under blanket and in her purse. She stated that she was looking for her zofran. Hydrocodone was in the pts lap. I took the medications from the pt, she had meloxicam, zofran, hydrocodone, azithromycin, and 2 insulin syringes. 1 insulin syringe was already used, she asked me to throw away the insulin syringes, but I did keep the one that was unused. I informed pt that she is not to take her own medications in the hospital, but only medications that we supply. Medications will be sent to pharmacy via [facility] tonight, Dr. Kerry Hough text paged regarding this incident. [name]

## 2012-07-10 NOTE — H&P (Signed)
[facility] History and Physical  Danielle Chandler BMW:[id number] DOB: [birth year] DOA: 07/09/2012  Referring physician: Dr. Preston Fleeting PCP: No PCP Per Patient  Specialists:   Chief Complaint: Right-sided back and chest pain  HPI: Danielle Chandler is a 27 y.o. female with a history of IV drug abuse with heroin presents to the emergency room with complaints of right-sided back and chest pain. Patient reports onset of symptoms approximately 3 days ago. She initially went to [facility] emergency room and reports being discharged home. When her symptoms not improve, she went to [facility] emergency room the next day. She was found to have a right-sided pneumonia and was given oral azithromycin. The following day when her symptoms continued to get worse, she presented to [facility]. She reports having fevers, nausea vomiting, right-sided back pain which radiates to her chest. This is worse with deep inspiration and cough. She does have a cough but has been trying to avoid coughing due to pain. Denies any diarrhea or dysuria. She has significant shortness of breath. Denies any sick contacts. She's been admitted for further treatment of pneumonia.  Of note, the patient adamantly denies any history of drug abuse whatsoever. She does occasionally smoking marijuana denies any history of early. Review of her medical records indicate that she was clearly hospitalized [facility] 2014 for detox from heroin. At that time, she reported that she was injecting heroin and has also been snorting it.  Review of Systems: Pertinent positives as per history of present illness, otherwise negative  Past Medical History  Diagnosis Date  . Headache   . Anxiety   . Drug use    Past Surgical History  Procedure Laterality Date  . Cholecystectomy    . Facial reconstruction surgery    . Cholecystectomy    . Ovarian cyst removal    . Tympanostomy tube placement    . Mastoidectomy revision      Social History:  reports that she has been smoking Cigarettes.  She has a 1 pack-year smoking history. She has never used smokeless tobacco. She reports that she uses illicit drugs (Marijuana and Heroin). She reports that she does not drink alcohol.   Allergies  Allergen Reactions  . Nubain (Nalbuphine Hcl) Other (See Comments)    Patient said it made her hurt really badly, also burning sensation throughout body    Family History  Problem Relation Age of Onset  . Diabetes Maternal Aunt   . Heart disease Maternal Aunt   . Diabetes Maternal Grandmother   . Diabetes Maternal Grandfather   . Heart disease Maternal Grandfather   . Anesthesia problems Neg Hx     Prior to Admission medications   Medication Sig Start Date End Date Taking? Authorizing Provider  acetaminophen (TYLENOL) 500 MG tablet Take 2 tablets (1,000 mg total) by mouth every 6 (six) hours as needed. For pain 05/30/12  Yes Sanjuana Kava, NP  azithromycin (ZITHROMAX) 250 MG tablet Take 250 mg by mouth daily.   Yes Historical Provider, MD  HYDROcodone-acetaminophen (NORCO/VICODIN) 5-325 MG per tablet Take 1 tablet by mouth every 6 (six) hours as needed for pain.   Yes Historical Provider, MD  ondansetron (ZOFRAN-ODT) 4 MG disintegrating tablet Take 4 mg by mouth every 8 (eight) hours as needed for nausea.   Yes Historical Provider, MD  traZODone (DESYREL) 50 MG tablet Take 1 tablet (50 mg total) by mouth at bedtime and may repeat dose one time if needed. For  depression/sleep 05/30/12   Sanjuana Kava, NP   Physical Exam: Filed Vitals:   07/10/12 0603 07/10/12 0640 07/10/12 0700 07/10/12 0727  BP: 114/72 110/61    Pulse: 111  113   Temp: 98.5 F (36.9 C) 98.1 F (36.7 C)    TempSrc: Oral Oral    Resp: 20  27   Height:  5\' 3"  (1.6 m)    Weight:  91.7 kg (202 lb 2.6 oz)    SpO2: 95%  94% 94%     General:  Patient appears to be uncomfortable with her breathing. She has short shallow breaths.  Eyes: Pupils are equal  round react to light  ENT: Mucous membranes are dry  Neck: Supple  Cardiovascular: S1, S2, tachycardic  Respiratory: Clear to auscultation bilaterally  Abdomen: Soft, nontender, nondistended, bowel sounds are active  Skin: No rashes  Musculoskeletal: She has tenderness to palpation over the right back and right chest.  Psychiatric: Appears somewhat anxious, cooperative with exam  Neurologic: Grossly intact, nonfocal  Labs on Admission:  Basic Metabolic Panel:  Recent Labs Lab 07/10/12 0005  NA 131*  K 4.2  CL 96  CO2 23  GLUCOSE 108*  BUN 9  CREATININE 0.82  CALCIUM 9.8   Liver Function Tests:  Recent Labs Lab 07/10/12 0005  AST 17  ALT 16  ALKPHOS 110  BILITOT 0.4  PROT 8.3  ALBUMIN 3.4*   No results found for this basename: LIPASE, AMYLASE,  in the last 168 hours No results found for this basename: AMMONIA,  in the last 168 hours CBC:  Recent Labs Lab 07/10/12 0005  WBC 13.5*  NEUTROABS 11.1*  HGB 11.8*  HCT 35.5*  MCV 86.2  PLT 308   Cardiac Enzymes: No results found for this basename: CKTOTAL, CKMB, CKMBINDEX, TROPONINI,  in the last 168 hours  BNP (last 3 results) No results found for this basename: PROBNP,  in the last 8760 hours CBG: No results found for this basename: GLUCAP,  in the last 168 hours  Radiological Exams on Admission: Dg Chest 2 View  07/10/2012  *RADIOLOGY REPORT*  Clinical Data: Cough and congestion.  Back pain.  CHEST - 2 VIEW  Comparison: PA and lateral chest 01/18/2012.  Findings: There is right worse than left basilar airspace disease and a small right effusion.  No pneumothorax.  Heart size normal.  IMPRESSION: Right worse than left basilar airspace disease and a small right effusion worrisome for pneumonia.   Original Report Authenticated By: Holley Dexter, M.D.    Ct Angio Chest W/cm &/or Wo Cm  07/10/2012  *RADIOLOGY REPORT*  Clinical Data: Back pain.  CT ANGIOGRAPHY CHEST  Technique:  Multidetector CT  imaging of the chest using the standard protocol during bolus administration of intravenous contrast. Multiplanar reconstructed images including MIPs were obtained and reviewed to evaluate the vascular anatomy.  Contrast: OMNIPAQUE IOHEXOL 350 MG/ML SOLN  Comparison: PA and lateral chest 07/10/2012.  Findings: No pulmonary embolus is identified.  There is a small right pleural effusion.  No left pleural effusion or pericardial effusion.  Heart size is upper normal.  Lungs demonstrate bibasilar airspace disease, worse on the right, with an appearance most compatible with pneumonia.  Incidentally imaged upper abdomen is unremarkable.  No focal bony abnormality is identified.  IMPRESSION:  1.  Negative for pulmonary embolus. 2.  Right worse than left lower lobe airspace disease and a small right effusion most consistent with pneumonia.   Original Report Authenticated By: Maisie Fus  Maricela Curet, M.D.     EKG: Independently reviewed. Sinus tachycardia  Assessment/Plan Active Problems:   Heroin dependence   Community acquired pneumonia   Chest pain   1. Community-acquired pneumonia. Patient will be admitted to telemetry. She will be started on ceftriaxone and azithromycin. Due to her history of intravenous Retavase, she is at risk for MRSA. We will 4 also continue vancomycin. Check serum HIV antibody. Continue pulmonary hygiene, wean down oxygen as tolerated 2. Tachycardia. Possibly related to pain an underlying infection. Continue to monitor. 3. History of intravenous drug abuse. Patient is currently denying any history of this.   Code Status: full code Family Communication: discussed with patient Disposition Plan: discharge home when improved, likely 2-3 days  Time spent:  MEMON,JEHANZEB [facility] Pager [phone]  If 7PM-7AM, please contact night-coverage [email or web address] Password [email or web address] 07/10/2012, 8:06 AM

## 2012-07-10 NOTE — Progress Notes (Signed)
Paged Dr. Kerry Hough, and notified him of patients continued complaints of SOB related to pain. Orders received for nebulizers and increased Dilaudid from 0.5mg  to 1mg . [name]

## 2012-07-10 NOTE — Progress Notes (Signed)
Received call from operator stating that pt hd called requesting housekeeping to empty trash. Pt had also told nt that she had not eaten anything all day except breakfast.but empty supper tray at bedside. Daytime Rn had reported pt had ate and  also about meds found with pt earlier.   Did explain to pt that we could give her a frozen meal and that this undersigned nurse would take out trash. Pt requested pain med and instructed pt that she had her pain meds scheduled.  Pt very upset requesting another nurse. Mother, Ishia Tenorio called at 2030 and stated that she had ate food off tray in room and that she had paid $6 for it and that she needed her pain meds. Explained staff had to follow md orders and that pt was to get more food. Mother stated that she would come and "take her daughter tomorrow and would complain to head of [place]" Did offer phone numbers of dept manager but she refused. Will cont to monitor pt.

## 2012-07-10 NOTE — Progress Notes (Signed)
ANTIBIOTIC CONSULT NOTE - INITIAL  Pharmacy Consult for Vancomycin Indication: pneumonia  Allergies  Allergen Reactions  . Nubain (Nalbuphine Hcl) Other (See Comments)    Patient said it made her hurt really badly, also burning sensation throughout body    Patient Measurements: Height: 5\' 3"  (160 cm) Weight: 202 lb 2.6 oz (91.7 kg) IBW/kg (Calculated) : 52.4  Vital Signs: Temp: 98.1 F (36.7 C) (05/11 0640) Temp src: Oral (05/11 0640) BP: 110/61 mmHg (05/11 0640) Pulse Rate: 113 (05/11 0700) Intake/Output from previous day:   Intake/Output from this shift:    Labs:  Recent Labs  07/10/12 0005  WBC 13.5*  HGB 11.8*  PLT 308  CREATININE 0.82   Estimated Creatinine Clearance: 112.8 ml/min (by C-G formula based on Cr of 0.82). No results found for this basename: VANCOTROUGH, Leodis Binet, VANCORANDOM, GENTTROUGH, GENTPEAK, GENTRANDOM, TOBRATROUGH, TOBRAPEAK, TOBRARND, AMIKACINPEAK, AMIKACINTROU, AMIKACIN,  in the last 72 hours   Microbiology: Recent Results (from the past 720 hour(s))  CULTURE, BLOOD (ROUTINE X 2)     Status: None   Collection Time    07/10/12  4:04 AM      Result Value Range Status   Specimen Description Blood RIGHT ANTECUBITAL   Final   Special Requests BOTTLES DRAWN AEROBIC AND ANAEROBIC 8CC   Final   Culture PENDING   Incomplete   Report Status PENDING   Incomplete  CULTURE, BLOOD (ROUTINE X 2)     Status: None   Collection Time    07/10/12  4:09 AM      Result Value Range Status   Specimen Description Blood BLOOD RIGHT HAND   Final   Special Requests BOTTLES DRAWN AEROBIC AND ANAEROBIC [facility]   Final   Culture PENDING   Incomplete   Report Status PENDING   Incomplete    Medical History: Past Medical History  Diagnosis Date  . Headache   . Anxiety   . Drug use     Medications:  Prescriptions prior to admission  Medication Sig Dispense Refill  . acetaminophen (TYLENOL) 500 MG tablet Take 2 tablets (1,000 mg total) by mouth every 6 (six)  hours as needed. For pain  30 tablet    . azithromycin (ZITHROMAX) 250 MG tablet Take 250 mg by mouth daily.      Marland Kitchen HYDROcodone-acetaminophen (NORCO/VICODIN) 5-325 MG per tablet Take 1 tablet by mouth every 6 (six) hours as needed for pain.      Marland Kitchen ondansetron (ZOFRAN-ODT) 4 MG disintegrating tablet Take 4 mg by mouth every 8 (eight) hours as needed for nausea.      . traZODone (DESYREL) 50 MG tablet Take 1 tablet (50 mg total) by mouth at bedtime and may repeat dose one time if needed. For depression/sleep  30 tablet  0   Scheduled:  . azithromycin  500 mg Intravenous Q24H  . [COMPLETED] cefTRIAXone (ROCEPHIN) IVPB 1 gram/50 mL D5W  1 g Intravenous Once  . [START ON 07/11/2012] cefTRIAXone (ROCEPHIN)  IV  1 g Intravenous Q24H  . enoxaparin (LOVENOX) injection  40 mg Subcutaneous Q24H  . guaiFENesin  1,200 mg Oral BID  . [COMPLETED]  HYDROmorphone (DILAUDID) injection  1 mg Intravenous Once  . [COMPLETED]  HYDROmorphone (DILAUDID) injection  1 mg Intravenous Once  . [COMPLETED] ketorolac  30 mg Intravenous Once  . ketorolac  30 mg Intravenous Q6H  . [COMPLETED] ondansetron  4 mg Intravenous Once  . [COMPLETED] sodium chloride  1,000 mL Intravenous Once  . vancomycin  1,000 mg Intravenous  Q8H  . [DISCONTINUED] sodium chloride   Intravenous STAT  . [DISCONTINUED] enoxaparin (LOVENOX) injection  40 mg Subcutaneous Q24H  . [DISCONTINUED] banana bag IV 1000 mL ** MVI currently unavailable **   Intravenous Once   Assessment: Okay for Protocol Estimated Creatinine Clearance: 112.8 ml/min (by C-G formula based on Cr of 0.82). 27 y.o. Female being treated for PNA.  Hx IV drug abuse and therefore at risk for MRSA per MD note.  Goal of Therapy:  Vancomycin trough level 15-20 mcg/ml  Plan:  Vancomycin 1000mg  IV every 8 hours. Expected duration 7 days with resolution of temperature and/or normalization of WBC Measure antibiotic drug levels at steady state Follow up culture results  Mady Gemma 07/10/2012,8:36 AM

## 2012-07-10 NOTE — Progress Notes (Signed)
Sputum culture collection at bedside, encouraged pt to expectorate whenever possible for collection. [name]

## 2012-07-10 NOTE — ED Provider Notes (Signed)
27 year old female had been placed on azithromycin for pneumonia diagnosed yesterday at [facility]. Pneumonia was seen on CT of her abdomen pelvis and not on chest x-ray. She is feeling worse with increasing pain and dyspnea. Pain is on the right side. On exam, she appears very uncomfortable and is to get the rest. Lungs are clear no do not hear any definite rales. Chest x-ray shows clear right basilar infiltrate and questionable left basilar infiltrate. Because of her degree of discomfort which seems out of proportion to what I'm seeing in her chest x-ray, she is sent for a CT angiogram to make sure she does not have pulmonary embolism. This shows bibasilar airspace disease but no evidence of pulmonary emboli. I am concerned that azithromycin is not sufficient to treat her and she fears too sick to go home. She's given a dose of ceftriaxone in the ED. Case is been discussed with Dr. Phillips Odor to fight off was increased to admit the patient.  Results for orders placed during the hospital encounter of 07/09/12  CULTURE, BLOOD (ROUTINE X 2)      Result Value Range   Specimen Description Blood RIGHT ANTECUBITAL     Special Requests BOTTLES DRAWN AEROBIC AND ANAEROBIC 8CC     Culture PENDING     Report Status PENDING    CULTURE, BLOOD (ROUTINE X 2)      Result Value Range   Specimen Description Blood BLOOD RIGHT HAND     Special Requests BOTTLES DRAWN AEROBIC AND ANAEROBIC 7CC     Culture PENDING     Report Status PENDING    URINALYSIS, ROUTINE W REFLEX MICROSCOPIC      Result Value Range   Color, Urine YELLOW  YELLOW   APPearance CLEAR  CLEAR   Specific Gravity, Urine >1.030 (*) 1.005 - 1.030   pH 6.0  5.0 - 8.0   Glucose, UA NEGATIVE  NEGATIVE mg/dL   Hgb urine dipstick NEGATIVE  NEGATIVE   Bilirubin Urine NEGATIVE  NEGATIVE   Ketones, ur 15 (*) NEGATIVE mg/dL   Protein, ur NEGATIVE  NEGATIVE mg/dL   Urobilinogen, UA 0.2  0.0 - 1.0 mg/dL   Nitrite NEGATIVE  NEGATIVE   Leukocytes, UA NEGATIVE  NEGATIVE  CBC WITH DIFFERENTIAL      Result Value Range   WBC 13.5 (*) 4.0 - 10.5 K/uL   RBC 4.12  3.87 - 5.11 MIL/uL   Hemoglobin 11.8 (*) 12.0 - 15.0 g/dL   HCT 16.1 (*) 09.6 - 04.5 %   MCV 86.2  78.0 - 100.0 fL   MCH 28.6  26.0 - 34.0 pg   MCHC 33.2  30.0 - 36.0 g/dL   RDW 40.9  81.1 - 91.4 %   Platelets 308  150 - 400 K/uL   Neutrophils Relative 83 (*) 43 - 77 %   Neutro Abs 11.1 (*) 1.7 - 7.7 K/uL   Lymphocytes Relative 11 (*) 12 - 46 %   Lymphs Abs 1.5  0.7 - 4.0 K/uL   Monocytes Relative 6  3 - 12 %   Monocytes Absolute 0.8  0.1 - 1.0 K/uL   Eosinophils Relative 0  0 - 5 %   Eosinophils Absolute 0.0  0.0 - 0.7 K/uL   Basophils Relative 0  0 - 1 %   Basophils Absolute 0.0  0.0 - 0.1 K/uL  BASIC METABOLIC PANEL      Result Value Range   Sodium 131 (*) 135 - 145 mEq/L  Potassium 4.2  3.5 - 5.1 mEq/L   Chloride 96  96 - 112 mEq/L   CO2 23  19 - 32 mEq/L   Glucose, Bld 108 (*) 70 - 99 mg/dL   BUN 9  6 - 23 mg/dL   Creatinine, Ser 2.95  0.50 - 1.10 mg/dL   Calcium 9.8  8.4 - 62.1 mg/dL   GFR calc non Af Amer >90  >90 mL/min   GFR calc Af Amer >90  >90 mL/min  HEPATIC FUNCTION PANEL      Result Value Range   Total Protein 8.3  6.0 - 8.3 g/dL   Albumin 3.4 (*) 3.5 - 5.2 g/dL   AST 17  0 - 37 U/L   ALT 16  0 - 35 U/L   Alkaline Phosphatase 110  39 - 117 U/L   Total Bilirubin 0.4  0.3 - 1.2 mg/dL   Bilirubin, Direct 0.1  0.0 - 0.3 mg/dL   Indirect Bilirubin 0.3  0.3 - 0.9 mg/dL  POCT PREGNANCY, URINE      Result Value Range   Preg Test, Ur NEGATIVE  NEGATIVE   Dg Chest 2 View  07/10/2012  *RADIOLOGY REPORT*  Clinical Data: Cough and congestion.  Back pain.  CHEST - 2 VIEW  Comparison: PA and lateral chest 01/18/2012.  Findings: There is right worse than left basilar airspace disease and a small right effusion.  No pneumothorax.  Heart size normal.  IMPRESSION: Right worse than left basilar airspace disease and a small right effusion worrisome  for pneumonia.   Original Report Authenticated By: Holley Dexter, M.D.    Ct Angio Chest W/cm &/or Wo Cm  07/10/2012  *RADIOLOGY REPORT*  Clinical Data: Back pain.  CT ANGIOGRAPHY CHEST  Technique:  Multidetector CT imaging of the chest using the standard protocol during bolus administration of intravenous contrast. Multiplanar reconstructed images including MIPs were obtained and reviewed to evaluate the vascular anatomy.  Contrast: OMNIPAQUE IOHEXOL 350 MG/ML SOLN  Comparison: PA and lateral chest 07/10/2012.  Findings: No pulmonary embolus is identified.  There is a small right pleural effusion.  No left pleural effusion or pericardial effusion.  Heart size is upper normal.  Lungs demonstrate bibasilar airspace disease, worse on the right, with an appearance most compatible with pneumonia.  Incidentally imaged upper abdomen is unremarkable.  No focal bony abnormality is identified.  IMPRESSION:  1.  Negative for pulmonary embolus. 2.  Right worse than left lower lobe airspace disease and a small right effusion most consistent with pneumonia.   Original Report Authenticated By: Holley Dexter, M.D.   Images viewed by me.  Medical screening examination/treatment/procedure(s) were conducted as a shared visit with non-physician practitioner(s) and myself.  I personally evaluated the patient during the encounter   Dione Booze, MD 07/10/12 [phone]

## 2012-07-10 NOTE — ED Provider Notes (Signed)
History     CSN: [id number]  Arrival date & time 07/09/12  2254   First MD Initiated Contact with Patient 07/09/12 2316      Chief Complaint  Patient presents with  . Back Pain    (Consider location/radiation/quality/duration/timing/severity/associated sxs/prior treatment) HPI Comments: ASHELYNN MARKS is a 27 y.o. Female presenting with severe right mid back and flank pain along with increased shortness of breath, fevers and chills  which has worsened over the past 24 hours.  She was seen at [facility] 2 days ago at which time she was diagnosed with a urinary infection and is currently taking bactrim for this.  She was seen at [facility] yesterday due to increasing flank pain and shortness of breath and was diagnosed with a right lower lobe pneumonia which she states did not show up on xray,  But was found through Ct scan and is currently on zithromax for this infection.  She does have a history of substance abuse, but denies any current use.  She was prescribed hydrocodone yesterday,  Her last dose taken at 1 pm today and does not relieve her pain.  She has had nausea with 2 episodes of vomiting today and is taking zofran for this symptom.  She denies dysuria, hematuria, diarrhea and has had no vaginal discharge or pelvic pain.       Patient is a 27 y.o. female presenting with back pain. The history is provided by the patient.  Back Pain Associated symptoms: abdominal pain and fever   Associated symptoms: no chest pain, no dysuria, no headaches, no numbness and no weakness     Past Medical History  Diagnosis Date  . Headache   . Anxiety   . Drug use     Past Surgical History  Procedure Laterality Date  . Cholecystectomy    . Facial reconstruction surgery    . Cholecystectomy    . Ovarian cyst removal    . Tympanostomy tube placement    . Mastoidectomy revision      Family History  Problem Relation Age of Onset  . Diabetes Maternal Aunt   . Heart disease  Maternal Aunt   . Diabetes Maternal Grandmother   . Diabetes Maternal Grandfather   . Heart disease Maternal Grandfather   . Anesthesia problems Neg Hx     History  Substance Use Topics  . Smoking status: Current Every Day Smoker -- 0.25 packs/day for 4 years    Types: Cigarettes  . Smokeless tobacco: Never Used  . Alcohol Use: No    OB History   Grav Para Term Preterm Abortions TAB SAB Ect Mult Living   2 2 2       2       Review of Systems  Constitutional: Positive for fever and chills.  HENT: Negative for congestion, sore throat, rhinorrhea, trouble swallowing and neck pain.   Eyes: Negative.   Respiratory: Positive for shortness of breath. Negative for chest tightness, wheezing and stridor.   Cardiovascular: Negative for chest pain.  Gastrointestinal: Positive for nausea, vomiting and abdominal pain. Negative for diarrhea and constipation.  Genitourinary: Positive for flank pain. Negative for dysuria.  Musculoskeletal: Positive for back pain. Negative for joint swelling and arthralgias.  Skin: Negative.  Negative for rash and wound.  Neurological: Negative for dizziness, weakness, light-headedness, numbness and headaches.  Psychiatric/Behavioral: Negative.     Allergies  Nubain  Home Medications   Current Outpatient Rx  Name  Route  Sig  Dispense  Refill  . acetaminophen (TYLENOL) 500 MG tablet   Oral   Take 2 tablets (1,000 mg total) by mouth every 6 (six) hours as needed. For pain   30 tablet      . azithromycin (ZITHROMAX) 250 MG tablet   Oral   Take 250 mg by mouth daily.         Marland Kitchen HYDROcodone-acetaminophen (NORCO/VICODIN) 5-325 MG per tablet   Oral   Take 1 tablet by mouth every 6 (six) hours as needed for pain.         Marland Kitchen ondansetron (ZOFRAN-ODT) 4 MG disintegrating tablet   Oral   Take 4 mg by mouth every 8 (eight) hours as needed for nausea.         . traZODone (DESYREL) 50 MG tablet   Oral   Take 1 tablet (50 mg total) by mouth at bedtime  and may repeat dose one time if needed. For depression/sleep   30 tablet   0     BP 128/75  Pulse 112  Temp(Src) 99.3 F (37.4 C) (Oral)  Resp 20  Ht 5\' 3"  (1.6 m)  Wt 170 lb (77.111 kg)  BMI 30.12 kg/m2  SpO2 95%  LMP 07/02/2012  Physical Exam  Nursing note and vitals reviewed. Constitutional: She appears well-developed and well-nourished.  Pt appears uncomfortable  HENT:  Head: Normocephalic and atraumatic.  Mouth/Throat: Oropharynx is clear and moist.  Eyes: Conjunctivae are normal.  Neck: Normal range of motion.  Cardiovascular: Regular rhythm, normal heart sounds and intact distal pulses.  Tachycardia present.   Pulmonary/Chest: Effort normal and breath sounds normal. She has no wheezes.  Abdominal: Soft. Bowel sounds are normal. There is tenderness in the right upper quadrant. There is CVA tenderness. There is no rebound, no guarding and negative Murphy's sign.  CVA tenderness right.  Musculoskeletal: Normal range of motion.  Neurological: She is alert.  Skin: Skin is warm. She is diaphoretic.  Psychiatric: She has a normal mood and affect.    ED Course  Procedures (including critical care time)  Labs Reviewed  URINALYSIS, ROUTINE W REFLEX MICROSCOPIC  CBC WITH DIFFERENTIAL  BASIC METABOLIC PANEL   No results found.   No diagnosis found.    MDM  1:49 AM Pending chest xray, hepatic fxn panel.  Have requested CT results from [facility] completed yesterday.  Discussed with Dr. Preston Fleeting who will follow pt.        Burgess Amor, PA-C 07/10/12 0150

## 2012-07-11 ENCOUNTER — Inpatient Hospital Stay (HOSPITAL_COMMUNITY): Payer: Medicaid Other

## 2012-07-11 ENCOUNTER — Other Ambulatory Visit (HOSPITAL_COMMUNITY): Payer: Self-pay

## 2012-07-11 DIAGNOSIS — F1120 Opioid dependence, uncomplicated: Secondary | ICD-10-CM

## 2012-07-11 DIAGNOSIS — F329 Major depressive disorder, single episode, unspecified: Secondary | ICD-10-CM

## 2012-07-11 DIAGNOSIS — J189 Pneumonia, unspecified organism: Principal | ICD-10-CM

## 2012-07-11 DIAGNOSIS — F1920 Other psychoactive substance dependence, uncomplicated: Secondary | ICD-10-CM

## 2012-07-11 LAB — CBC
HCT: 31 % — ABNORMAL LOW (ref 36.0–46.0)
Hemoglobin: 10.5 g/dL — ABNORMAL LOW (ref 12.0–15.0)
MCH: 29.2 pg (ref 26.0–34.0)
MCHC: 33.9 g/dL (ref 30.0–36.0)
MCV: 86.4 fL (ref 78.0–100.0)
RBC: 3.59 MIL/uL — ABNORMAL LOW (ref 3.87–5.11)

## 2012-07-11 LAB — COMPREHENSIVE METABOLIC PANEL
ALT: 21 U/L (ref 0–35)
AST: 21 U/L (ref 0–37)
Albumin: 2.7 g/dL — ABNORMAL LOW (ref 3.5–5.2)
Alkaline Phosphatase: 141 U/L — ABNORMAL HIGH (ref 39–117)
Chloride: 101 mEq/L (ref 96–112)
Potassium: 3.8 mEq/L (ref 3.5–5.1)
Sodium: 136 mEq/L (ref 135–145)
Total Bilirubin: 0.4 mg/dL (ref 0.3–1.2)

## 2012-07-11 LAB — BLOOD GAS, ARTERIAL
Acid-base deficit: 1.3 mmol/L (ref 0.0–2.0)
Bicarbonate: 22.3 mEq/L (ref 20.0–24.0)
Patient temperature: 37
TCO2: 20.4 mmol/L (ref 0–100)
TCO2: 20.3 mmol/L (ref 0–100)
pCO2 arterial: 36 mmHg (ref 35.0–45.0)
pH, Arterial: 7.441 (ref 7.350–7.450)
pO2, Arterial: 262 mmHg — ABNORMAL HIGH (ref 80.0–100.0)

## 2012-07-11 LAB — LEGIONELLA ANTIGEN, URINE: Legionella Antigen, Urine: NEGATIVE

## 2012-07-11 LAB — LACTIC ACID, PLASMA: Lactic Acid, Venous: 0.8 mmol/L (ref 0.5–2.2)

## 2012-07-11 LAB — PROCALCITONIN: Procalcitonin: <0.1 ng/mL

## 2012-07-11 LAB — GLUCOSE, CAPILLARY: Glucose-Capillary: 96 mg/dL (ref 70–99)

## 2012-07-11 LAB — PROTIME-INR: Prothrombin Time: 13 seconds (ref 11.6–15.2)

## 2012-07-11 MED ORDER — SODIUM CHLORIDE 0.9 % IV SOLN: INTRAVENOUS | Status: DC

## 2012-07-11 MED ORDER — LORAZEPAM 1 MG PO TABS
1.0000 mg | ORAL_TABLET | Freq: Four times a day (QID) | ORAL | Status: DC | PRN
  Administered 2012-07-11 – 2012-07-18 (×6): 1 mg via ORAL
  Filled 2012-07-11 (×3): qty 1
  Filled 2012-07-11: qty 2
  Filled 2012-07-11 (×3): qty 1

## 2012-07-11 MED ORDER — SODIUM CHLORIDE 0.9 % IV SOLN
1.0000 g | Freq: Three times a day (TID) | INTRAVENOUS | Status: DC
  Administered 2012-07-11 – 2012-07-13 (×7): 1 g via INTRAVENOUS
  Filled 2012-07-11 (×12): qty 1

## 2012-07-11 MED ORDER — MORPHINE SULFATE 2 MG/ML IJ SOLN
2.0000 mg | INTRAMUSCULAR | Status: DC | PRN
  Administered 2012-07-11 – 2012-07-14 (×5): 2 mg via INTRAVENOUS
  Filled 2012-07-11 (×5): qty 1

## 2012-07-11 MED ORDER — HYDROMORPHONE HCL PF 1 MG/ML IJ SOLN
1.0000 mg | INTRAMUSCULAR | Status: DC | PRN
  Administered 2012-07-11 – 2012-07-14 (×20): 1 mg via INTRAVENOUS
  Filled 2012-07-11 (×21): qty 1

## 2012-07-11 MED ORDER — CLONIDINE HCL 0.1 MG PO TABS
0.1000 mg | ORAL_TABLET | Freq: Four times a day (QID) | ORAL | Status: DC | PRN
  Filled 2012-07-11: qty 1

## 2012-07-11 NOTE — Progress Notes (Signed)
  Echocardiogram 2D Echocardiogram has been performed.  Keidy Thurgood FRANCES 07/11/2012, 5:14 PM

## 2012-07-11 NOTE — H&P (Signed)
PULMONARY  / CRITICAL CARE MEDICINE  Name: Danielle Chandler MRN: [id number] DOB: 1985-07-23    ADMISSION DATE:  07/09/2012 CONSULTATION DATE:  07/11/12  REFERRING MD :  Jeani Hawking PRIMARY SERVICE: PCCM   CHIEF COMPLAINT:  Acute respiratory failure   BRIEF PATIENT DESCRIPTION:  27yo female with hx IV drug use admitted initially to [facility] with CAP.  On 5/12 tx to [facility] for worsening dyspnea, large pleural effusion and ?ARDS.  Overnight developed worsening dyspnea, increasing hypoxia requiring NRB and developed large R pleural effusion and was tx to [facility] for further management and likely thoracentesis.   SIGNIFICANT EVENTS / STUDIES:  5/11 HIV>>> NR 5/11 CTA chest>> Neg for PE, R>L asd, small R effusion   LINES / TUBES: none  CULTURES: BCx2 5/11>>> Sputum 5/12>>>  ANTIBIOTICS: Azithro 5/11>>>  Merropenem 5/11>>> Vanc 5/11>>>  HISTORY OF PRESENT ILLNESS:  27 yo female with hx polysubstance abuse, IV drug abuse presented 5/11 to [facility] ER with c/o back pain, fevers, cough, SOB.  Admitted to [facility] with R sided CAP.    PAST MEDICAL HISTORY :  Past Medical History  Diagnosis Date  . Headache   . Anxiety   . Drug use    Past Surgical History  Procedure Laterality Date  . Cholecystectomy    . Facial reconstruction surgery    . Cholecystectomy    . Ovarian cyst removal    . Tympanostomy tube placement    . Mastoidectomy revision     Prior to Admission medications   Medication Sig Start Date End Date Taking? Authorizing Provider  acetaminophen (TYLENOL) 500 MG tablet Take 2 tablets (1,000 mg total) by mouth every 6 (six) hours as needed. For pain 05/30/12  Yes Sanjuana Kava, NP  azithromycin (ZITHROMAX Z-PAK) 250 MG tablet Take 250 mg by mouth daily. 2 tablets day 1 then 1 tablet daily(started 07/09/2012)   Yes Historical Provider, MD  HYDROcodone-acetaminophen (NORCO/VICODIN) 5-325 MG per tablet Take 1 tablet by mouth every 6 (six) hours as needed for pain.   Yes  Historical Provider, MD  ondansetron (ZOFRAN-ODT) 4 MG disintegrating tablet Take 4 mg by mouth 3 (three) times daily.   Yes Historical Provider, MD  sulfamethoxazole-trimethoprim (BACTRIM DS) 800-160 MG per tablet Take 1 tablet by mouth 2 (two) times daily. Started on 07/07/2012   Yes Historical Provider, MD   Allergies  Allergen Reactions  . Nubain (Nalbuphine Hcl) Other (See Comments)    Patient said it made her hurt really badly, also burning sensation throughout body    FAMILY HISTORY:  Family History  Problem Relation Age of Onset  . Diabetes Maternal Aunt   . Heart disease Maternal Aunt   . Diabetes Maternal Grandmother   . Diabetes Maternal Grandfather   . Heart disease Maternal Grandfather   . Anesthesia problems Neg Hx    SOCIAL HISTORY:  reports that she has been smoking Cigarettes.  She has a 1 pack-year smoking history. She has never used smokeless tobacco. She reports that she uses illicit drugs (Marijuana and Heroin). She reports that she does not drink alcohol.  REVIEW OF SYSTEMS:  C/o SOB, back pain, R sided chest pain, lightheadedness, "people look fuzzy".  Denies sputum, hemoptysis, abd pain. All other systems reviewed and were neg.    VITAL SIGNS: Temp:  [98.6 F (37 C)-100.5 F (38.1 C)] 99.1 F (37.3 C) (05/12 0800) Pulse Rate:  [103-133] 109 (05/12 0830) Resp:  [22-36] 33 (05/12 0830) BP: (98-118)/(64-70) 115/69  mmHg (05/12 0830) SpO2:  [87 %-100 %] 100 % (05/12 0830) FiO2 (%):  [100 %] 100 % (05/12 0727) Weight:  [212 lb 15.4 oz (96.6 kg)] 212 lb 15.4 oz (96.6 kg) (05/12 0500) HEMODYNAMICS:   VENTILATOR SETTINGS: Vent Mode:  [-]  FiO2 (%):  [100 %] 100 % INTAKE / OUTPUT: Intake/Output     05/11 0701 - 05/12 0700 05/12 0701 - 05/13 0700   P.O. 240    I.V. (mL/kg) 1537.5 (15.9)    IV Piggyback 250    Total Intake(mL/kg) 2027.5 (21)    Urine (mL/kg/hr) 1225 (0.5)    Total Output 1225     Net +802.5            PHYSICAL EXAMINATION: General:   Young female, appears uncomfortable, NAD Neuro:  Drowsy but easily arousable, answers questions appropriately, MAE  HEENT:  Mm dry, venti mask Cardiovascular:  s1s2 rrr Lungs:  resps even, non labored on 40%venti mask Abdomen:  Soft, +bs Ext: warm and dry, no edema   LABS:  Recent Labs Lab 07/10/12 0005 07/11/12 0510 07/11/12 0630 07/11/12 0823  HGB 11.8* 10.5*  --   --   WBC 13.5* 10.1  --   --   PLT 308 327  --   --   NA 131* 136  --   --   K 4.2 3.8  --   --   CL 96 101  --   --   CO2 23 24  --   --   GLUCOSE 108* 104*  --   --   BUN 9 10  --   --   CREATININE 0.82 0.72  --   --   CALCIUM 9.8 8.8  --   --   AST 17 21  --   --   ALT 16 21  --   --   ALKPHOS 110 141*  --   --   BILITOT 0.4 0.4  --   --   PROT 8.3 7.2  --   --   ALBUMIN 3.4* 2.7*  --   --   PHART  --   --  7.441 7.411  PCO2ART  --   --  33.3* 36.0  PO2ART  --   --  55.2* 262.0*    Recent Labs Lab 07/11/12 0945  GLUCAP 96    CXR:  Dg Chest 2 View  07/10/2012  *RADIOLOGY REPORT*  Clinical Data: Cough and congestion.  Back pain.  CHEST - 2 VIEW  Comparison: PA and lateral chest 01/18/2012.  Findings: There is right worse than left basilar airspace disease and a small right effusion.  No pneumothorax.  Heart size normal.  IMPRESSION: Right worse than left basilar airspace disease and a small right effusion worrisome for pneumonia.   Original Report Authenticated By: Holley Dexter, M.D.    Ct Angio Chest W/cm &/or Wo Cm  07/10/2012  *RADIOLOGY REPORT*  Clinical Data: Back pain.  CT ANGIOGRAPHY CHEST  Technique:  Multidetector CT imaging of the chest using the standard protocol during bolus administration of intravenous contrast. Multiplanar reconstructed images including MIPs were obtained and reviewed to evaluate the vascular anatomy.  Contrast: OMNIPAQUE IOHEXOL 350 MG/ML SOLN  Comparison: PA and lateral chest 07/10/2012.  Findings: No pulmonary embolus is identified.  There is a small right  pleural effusion.  No left pleural effusion or pericardial effusion.  Heart size is upper normal.  Lungs demonstrate bibasilar airspace disease, worse on the right, with an  appearance most compatible with pneumonia.  Incidentally imaged upper abdomen is unremarkable.  No focal bony abnormality is identified.  IMPRESSION:  1.  Negative for pulmonary embolus. 2.  Right worse than left lower lobe airspace disease and a small right effusion most consistent with pneumonia.   Original Report Authenticated By: Holley Dexter, M.D.    Dg Chest Port 1 View  07/11/2012  *RADIOLOGY REPORT*  Clinical Data: 27 year old female with shortness of breath and chest pain.  PORTABLE CHEST - 1 VIEW  Comparison: Chest CTA 07/10/2012 and earlier.  Findings: Portable semi upright AP view 0615 hours.  Moderate to large right pleural effusion with a sub total atelectasis of the right lung.  Ventilation in the right lung probably is mildly decreased.  Superimposed consolidation at the right lung base was better demonstrated on the comparison.  Early consolidation or moderate atelectasis at the left base also appears stable.  No pneumothorax.  No definite left effusion.  Cardiac size and mediastinal contours are within normal limits.  Visualized tracheal air column is within normal limits.  IMPRESSION: 1.  Moderate-to-large right pleural effusion with compressive atelectasis of the right lung.  Superimposed right lower lobe consolidation better demonstrated on the comparison and compatible with pneumonia. 2.  Stable left lower lobe moderate atelectasis or  developing pneumonia.   Original Report Authenticated By: Erskine Speed, M.D.      ASSESSMENT / PLAN:  PULMONARY A: acute hypoxic respiratory failure - improving, decreased FiO2 demands.  CTA neg for PE.  CAP  R pleural effusion  P:   Cont O2 and wean as able  Hold on further ABG for now  Plan for thoracentesis  - coags pending  F/u CXR  Cont abx for CAP - see ID  pulm  hygiene as able PRN BD    CARDIOVASCULAR A:  No active issue  P:  Watch for s/s SIRS  Check TTE to look for vegetations given drug use  RENAL A:  No active issue P:   F/u chem   GASTROINTESTINAL A:  No active issue  P:     HEMATOLOGIC A:  Anemia - mild  P:  F/u cbc   INFECTIOUS A:  CAP  P:   Currently on Azithro, meropenem, Vanc - consider narrow back to rocephin/azithro.  Suspect decline this am r/t increasing effusion.  F/u cultures - unable to expectorate any sputum   ENDOCRINE A:  No active issue  P:     NEUROLOGIC A:  Hx IV drug use, polysubstance abuse, mult behavior health admits  Back pain/pleuritic chest pain P:   Cont methadone Low dose PRN morphine for pain   Pt/mom updated at bedside 5/12   I have personally obtained a history, examined the patient, evaluated laboratory and imaging results, formulated the assessment and plan and placed orders. CRITICAL CARE: The patient is critically ill with multiple organ systems failure and requires high complexity decision making for assessment and support, frequent evaluation and titration of therapies, application of advanced monitoring technologies and extensive interpretation of multiple databases. Critical Care Time devoted to patient care services described in this note is 60 minutes.    Danford Bad, NP 07/11/2012  9:52 AM Pager: [phone] or [phone]  *Care during the described time interval was provided by me and/or other providers on the critical care team. I have reviewed this patient's available data, including medical history, events of note, physical examination and test results as part of my evaluation.  Levy Pupa, MD,  PhD 07/11/2012, 12:48 PM [facility] [phone] or if no answer [phone]

## 2012-07-11 NOTE — Care Management Note (Addendum)
    Page 1 of 2   07/19/2012     3:41:01 PM   CARE MANAGEMENT NOTE 07/19/2012  Patient:  Danielle Chandler, Danielle Chandler   Account Number:  [id number]  Date Initiated:  07/11/2012  Documentation initiated by:  Junius Creamer  Subjective/Objective Assessment:   adm w pneumonia     Action/Plan:   lives w fam   Anticipated DC Date:  07/14/2012   Anticipated DC Plan:  HOME W HOME HEALTH SERVICES  In-house referral  Clinical Social Worker      DC Planning Services  CM consult      Choice offered to / List presented to:             Status of service:  Completed, signed off Medicare Important Message given?   (If response is "NO", the following Medicare IM given date fields will be blank) Date Medicare IM given:   Date Additional Medicare IM given:    Discharge Disposition:  HOME/SELF CARE  Per UR Regulation:  Reviewed for med. necessity/level of care/duration of stay  If discussed at Long Length of Stay Meetings, dates discussed:    Comments:  07/19/12 Elmer Bales RN, MSN- Case manager still unable to reach the [facility] to make a follow-up appointment for patient, who has now been discharged.  Initial voicemail was not returned and new voicemail was left at the clinic including patient information for clinic to call patient to schedule the appointment.  07/18/12 JULIE AMERSON,RN,BSN [phone] MET WITH PT TO DISCUSS PCP ARRANGEMENT.  PT STATES SHE HAD A PCP, BUT MISSED A COUPLE OF VISITS, AND THEY DISCHARGED HER FROM THE PRACTICE.  PT AGREEABLE TO FOLLOW UP AT [facility].  ATTEMPTED TO MAKE APPT, LEFT MESSAGE ON VOICEMAIL.  WILL AWAIT CALLBACK.  5/13 1148a debbie dowell rn,bsn sw to see for drug use.

## 2012-07-11 NOTE — Progress Notes (Signed)
[place] visualisation of rt pleural space - did not find fluid pocket large enough to tap. Will ask IR to image, since she just had CT done.  Danielle Chandler V.

## 2012-07-11 NOTE — Progress Notes (Signed)
ANTIBIOTIC CONSULT NOTE -  Follow up  Pharmacy Consult for Vancomycin and Meropenem Indication: pneumonia  Allergies  Allergen Reactions  . Nubain (Nalbuphine Hcl) Other (See Comments)    Patient said it made her hurt really badly, also burning sensation throughout body   Patient Measurements: Height: 5\' 3"  (160 cm) Weight: 212 lb 15.4 oz (96.6 kg) IBW/kg (Calculated) : 52.4  Vital Signs: Temp: 98.6 F (37 C) (05/12 0000) Temp src: Oral (05/12 0000) BP: 98/69 mmHg (05/12 0400) Pulse Rate: 121 (05/12 0600) Intake/Output from previous day: 05/11 0701 - 05/12 0700 In: 2027.5 [P.O.:240; I.V.:1537.5; IV Piggyback:250] Out: 1225 [Urine:1225] Intake/Output from this shift:    Labs:  Recent Labs  07/10/12 0005 07/11/12 0510  WBC 13.5* 10.1  HGB 11.8* 10.5*  PLT 308 327  CREATININE 0.82 0.72   Estimated Creatinine Clearance: 119 ml/min (by C-G formula based on Cr of 0.72). No results found for this basename: VANCOTROUGH, Leodis Binet, VANCORANDOM, GENTTROUGH, GENTPEAK, GENTRANDOM, TOBRATROUGH, TOBRAPEAK, TOBRARND, AMIKACINPEAK, AMIKACINTROU, AMIKACIN,  in the last 72 hours   Microbiology: Recent Results (from the past 720 hour(s))  CULTURE, BLOOD (ROUTINE X 2)     Status: None   Collection Time    07/10/12  4:04 AM      Result Value Range Status   Specimen Description Blood RIGHT ANTECUBITAL   Final   Special Requests BOTTLES DRAWN AEROBIC AND ANAEROBIC 8CC   Final   Culture NO GROWTH <24 HRS   Final   Report Status PENDING   Incomplete  CULTURE, BLOOD (ROUTINE X 2)     Status: None   Collection Time    07/10/12  4:09 AM      Result Value Range Status   Specimen Description Blood BLOOD RIGHT HAND   Final   Special Requests BOTTLES DRAWN AEROBIC AND ANAEROBIC 7CC   Final   Culture NO GROWTH <24 HRS   Final   Report Status PENDING   Incomplete  MRSA PCR SCREENING     Status: Abnormal   Collection Time    07/10/12  7:08 AM      Result Value Range Status   MRSA by PCR  POSITIVE (*) NEGATIVE Final   Comment:            The GeneXpert MRSA Assay (FDA     approved for NASAL specimens     only), is one component of a     comprehensive MRSA colonization     surveillance program. It is not     intended to diagnose MRSA     infection nor to guide or     monitor treatment for     MRSA infections.     CRITICAL RESULT CALLED TO, READ BACK BY AND VERIFIED WITH:     GORDON, A. AT [id number] ON 07/10/12 BY PRUITT, C.   Medical History: Past Medical History  Diagnosis Date  . Headache   . Anxiety   . Drug use    Medications:  Prescriptions prior to admission  Medication Sig Dispense Refill  . acetaminophen (TYLENOL) 500 MG tablet Take 2 tablets (1,000 mg total) by mouth every 6 (six) hours as needed. For pain  30 tablet    . azithromycin (ZITHROMAX Z-PAK) 250 MG tablet Take 250 mg by mouth daily. 2 tablets day 1 then 1 tablet daily(started 07/09/2012)      . HYDROcodone-acetaminophen (NORCO/VICODIN) 5-325 MG per tablet Take 1 tablet by mouth every 6 (six) hours as needed for pain.      Marland Kitchen  ondansetron (ZOFRAN-ODT) 4 MG disintegrating tablet Take 4 mg by mouth 3 (three) times daily.      Marland Kitchen sulfamethoxazole-trimethoprim (BACTRIM DS) 800-160 MG per tablet Take 1 tablet by mouth 2 (two) times daily. Started on 07/07/2012       Scheduled:  . [COMPLETED] albuterol      . azithromycin  500 mg Intravenous Q24H  . Chlorhexidine Gluconate Cloth  6 each Topical Q0600  . cloNIDine  0.1 mg Transdermal Weekly  . enoxaparin (LOVENOX) injection  40 mg Subcutaneous Q24H  . guaiFENesin  1,200 mg Oral BID  . [EXPIRED] ipratropium      . ketorolac  30 mg Intravenous Q6H  . lidocaine  1 patch Transdermal Q24H  . LORazepam  1 mg Oral QHS  . meropenem (MERREM) IV  1 g Intravenous Q8H  . methadone  10 mg Oral Q8H  . mupirocin ointment  1 application Nasal BID  . vancomycin  1,000 mg Intravenous Q8H  . [DISCONTINUED] sodium chloride   Intravenous STAT  . [DISCONTINUED] cefTRIAXone  (ROCEPHIN)  IV  1 g Intravenous Q24H  . [DISCONTINUED] enoxaparin (LOVENOX) injection  40 mg Subcutaneous Q24H  . [DISCONTINUED] banana bag IV 1000 mL ** MVI currently unavailable **   Intravenous Once   Assessment: Okay for Protocol Estimated Creatinine Clearance: 119 ml/min (by C-G formula based on Cr of 0.72). 27 y.o. Female being treated for PNA.  Hx IV drug abuse and therefore at risk for MRSA per MD note. 07/11/2012.  Meropenem being added today and pt will reportedly be transferred to [facility].  Goal of Therapy:  Vancomycin trough level 15-20 mcg/ml  Plan:  Vancomycin 1000mg  IV every 8 hours. Expected duration 7 days with resolution of temperature and/or normalization of WBC Measure antibiotic drug levels at steady state Follow up culture results Meropenem 1gm IV q8hrs added 5/12  Valrie Hart A 07/11/2012,8:03 AM

## 2012-07-11 NOTE — Progress Notes (Signed)
[facility]                                                                                Patient Demographics  Danielle Chandler, is a 27 y.o. female, DOB - [birth year], VHQ:[id number], UXL:[id number]  Admit date - 07/09/2012  Admitting Physician Danielle Petrin, DO  Outpatient Primary MD for the patient is No PCP Per Patient  LOS - 2   Chief Complaint  Patient presents with  . Back Pain        Assessment & Plan   Assumed care of this patient admitted yesterday, with CAP, pan +ve UDS (heroin-cocaine), last night ++ SOB, now needing NRB mask and Pox 100%, talking in full sentences now, CXR repeat Large R.sided effusion likely Para pneumonic, D/W Dr Delton Coombes PCCM, patient to be transferred to [facility] ICU for a urgent thoracentesis.     1. Acute Resp failure - due to Community acquired pneumonia with large R.Pl Effusion, cultures pending - ABX now Vanco-Mero + Azithro - NRB mask and Nebs + pain control, CXR repeat Large R.sided effusion likely Para pneumonic, D/W Dr Delton Coombes PCCM, patient to be transferred to [facility] ICU for a urgent thoracentesis.    2. Pan +ve UDS - counselled to quit drug abuse. Uses cocaine avoid B blockers.      Code Status: Full  Family Communication: patient  Disposition Plan: ICU at [facility]   Procedures CTA Chest, CXR   Consults  PCCM - phone   DVT Prophylaxis  Lovenox    Lab Results  Component Value Date   PLT 327 07/11/2012    Medications  Scheduled Meds: . azithromycin  500 mg Intravenous Q24H  . Chlorhexidine Gluconate Cloth  6 each Topical Q0600  . cloNIDine  0.1 mg Transdermal Weekly  . enoxaparin (LOVENOX) injection  40 mg Subcutaneous Q24H  . guaiFENesin  1,200 mg Oral BID  . ketorolac  30 mg Intravenous Q6H  . lidocaine  1 patch Transdermal Q24H  . LORazepam  1 mg Oral QHS  . methadone  10 mg Oral Q8H  . mupirocin ointment  1 application Nasal BID  . vancomycin  1,000 mg Intravenous Q8H   Continuous Infusions: . sodium  chloride 125 mL/hr at 07/10/12 1442   PRN Meds:.albuterol, cloNIDine, HYDROmorphone (DILAUDID) injection, ipratropium, LORazepam, morphine injection, ondansetron (ZOFRAN) IV  Antibiotics    Anti-infectives   Start     Dose/Rate Route Frequency Ordered Stop   07/11/12 0600  cefTRIAXone (ROCEPHIN) 1 g in dextrose 5 % 50 mL IVPB  Status:  Discontinued     1 g 100 mL/hr over 30 Minutes Intravenous Every 24 hours 07/10/12 0639 07/11/12 0740   07/10/12 1000  vancomycin (VANCOCIN) IVPB 1000 mg/200 mL premix     1,000 mg 200 mL/hr over 60 Minutes Intravenous Every 8 hours 07/10/12 0738 07/17/12 0959   07/10/12 0645  azithromycin (ZITHROMAX) 500 mg in dextrose 5 % 250 mL IVPB     500 mg 250 mL/hr over 60 Minutes Intravenous Every 24 hours 07/10/12 0639 07/17/12 0759   07/10/12 0345  cefTRIAXone (ROCEPHIN) 1 g in dextrose 5 % 50 mL IVPB     1 g 100  mL/hr over 30 Minutes Intravenous  Once 07/10/12 1610 07/10/12 0545       Time Spent in minutes  35   Danielle Chandler.D on 07/11/2012 at 7:52 AM  Between 7am to 7pm - Pager - [phone]  After 7pm go to [email or web address] - password [email or web address]  And look for the night coverage person covering for me after hours  [facility] Office  [phone]    Subjective:   Danielle Chandler today has, No headache,+ve R.sided pl. chest pain and SOB, chest pain, No abdominal pain - No Nausea, No new weakness tingling or numbness,   Objective:   Filed Vitals:   07/11/12 0400 07/11/12 0500 07/11/12 0600 07/11/12 0727  BP: 98/69     Pulse:   121   Temp:      TempSrc:      Resp: 36 34 33   Height:      Weight:  96.6 kg (212 lb 15.4 oz)    SpO2:    100%    Wt Readings from Last 3 Encounters:  07/11/12 96.6 kg (212 lb 15.4 oz)  05/28/12 83.915 kg (185 lb)  01/18/12 74.844 kg (165 lb)     Intake/Output Summary (Last 24 hours) at 07/11/12 0752 Last data filed at 07/11/12 0527  Gross per 24 hour  Intake 2027.5 ml  Output   1225 ml  Net   802.5 ml    Exam Awake Alert, Oriented X 3, No new F.N deficits, Normal affect [place].AT,PERRAL Supple Neck,No JVD, No cervical lymphadenopathy appriciated.  Symmetrical Chest wall movement, Good air movement left side, reduced R sided b sounds  RRR,No Gallops,Rubs or new Murmurs, No Parasternal Heave +ve B.Sounds, Abd Soft, Non tender, No organomegaly appriciated, No rebound - guarding or rigidity. No Cyanosis, Clubbing or edema, No new Rash or bruise    Data Review   Micro Results Recent Results (from the past 240 hour(s))  CULTURE, BLOOD (ROUTINE X 2)     Status: None   Collection Time    07/10/12  4:04 AM      Result Value Range Status   Specimen Description Blood RIGHT ANTECUBITAL   Final   Special Requests BOTTLES DRAWN AEROBIC AND ANAEROBIC 8CC   Final   Culture NO GROWTH <24 HRS   Final   Report Status PENDING   Incomplete  CULTURE, BLOOD (ROUTINE X 2)     Status: None   Collection Time    07/10/12  4:09 AM      Result Value Range Status   Specimen Description Blood BLOOD RIGHT HAND   Final   Special Requests BOTTLES DRAWN AEROBIC AND ANAEROBIC 7CC   Final   Culture NO GROWTH <24 HRS   Final   Report Status PENDING   Incomplete  MRSA PCR SCREENING     Status: Abnormal   Collection Time    07/10/12  7:08 AM      Result Value Range Status   MRSA by PCR POSITIVE (*) NEGATIVE Final   Comment:            The GeneXpert MRSA Assay (FDA     approved for NASAL specimens     only), is one component of a     comprehensive MRSA colonization     surveillance program. It is not     intended to diagnose MRSA     infection nor to guide or     monitor treatment for     MRSA infections.  CRITICAL RESULT CALLED TO, READ BACK BY AND VERIFIED WITH:     GORDON, A. AT [id number] ON 07/10/12 BY PRUITT, C.    Radiology Reports Dg Chest 2 View  07/10/2012  *RADIOLOGY REPORT*  Clinical Data: Cough and congestion.  Back pain.  CHEST - 2 VIEW  Comparison: PA and lateral chest 01/18/2012.   Findings: There is right worse than left basilar airspace disease and a small right effusion.  No pneumothorax.  Heart size normal.  IMPRESSION: Right worse than left basilar airspace disease and a small right effusion worrisome for pneumonia.   Original Report Authenticated By: Holley Dexter, Chandler.D.    Ct Angio Chest W/cm &/or Wo Cm  07/10/2012  *RADIOLOGY REPORT*  Clinical Data: Back pain.  CT ANGIOGRAPHY CHEST  Technique:  Multidetector CT imaging of the chest using the standard protocol during bolus administration of intravenous contrast. Multiplanar reconstructed images including MIPs were obtained and reviewed to evaluate the vascular anatomy.  Contrast: OMNIPAQUE IOHEXOL 350 MG/ML SOLN  Comparison: PA and lateral chest 07/10/2012.  Findings: No pulmonary embolus is identified.  There is a small right pleural effusion.  No left pleural effusion or pericardial effusion.  Heart size is upper normal.  Lungs demonstrate bibasilar airspace disease, worse on the right, with an appearance most compatible with pneumonia.  Incidentally imaged upper abdomen is unremarkable.  No focal bony abnormality is identified.  IMPRESSION:  1.  Negative for pulmonary embolus. 2.  Right worse than left lower lobe airspace disease and a small right effusion most consistent with pneumonia.   Original Report Authenticated By: Holley Dexter, Chandler.D.    Dg Chest Port 1 View  07/11/2012  *RADIOLOGY REPORT*  Clinical Data: 27 year old female with shortness of breath and chest pain.  PORTABLE CHEST - 1 VIEW  Comparison: Chest CTA 07/10/2012 and earlier.  Findings: Portable semi upright AP view 0615 hours.  Moderate to large right pleural effusion with a sub total atelectasis of the right lung.  Ventilation in the right lung probably is mildly decreased.  Superimposed consolidation at the right lung base was better demonstrated on the comparison.  Early consolidation or moderate atelectasis at the left base also appears stable.   No pneumothorax.  No definite left effusion.  Cardiac size and mediastinal contours are within normal limits.  Visualized tracheal air column is within normal limits.  IMPRESSION: 1.  Moderate-to-large right pleural effusion with compressive atelectasis of the right lung.  Superimposed right lower lobe consolidation better demonstrated on the comparison and compatible with pneumonia. 2.  Stable left lower lobe moderate atelectasis or  developing pneumonia.   Original Report Authenticated By: Erskine Speed, Chandler.D.     CBC  Recent Labs Lab 07/10/12 0005 07/11/12 0510  WBC 13.5* 10.1  HGB 11.8* 10.5*  HCT 35.5* 31.0*  PLT 308 327  MCV 86.2 86.4  MCH 28.6 29.2  MCHC 33.2 33.9  RDW 15.3 15.6*  LYMPHSABS 1.5  --   MONOABS 0.8  --   EOSABS 0.0  --   BASOSABS 0.0  --     Chemistries   Recent Labs Lab 07/10/12 0005 07/11/12 0510  NA 131* 136  K 4.2 3.8  CL 96 101  CO2 23 24  GLUCOSE 108* 104*  BUN 9 10  CREATININE 0.82 0.72  CALCIUM 9.8 8.8  AST 17 21  ALT 16 21  ALKPHOS 110 141*  BILITOT 0.4 0.4   ------------------------------------------------------------------------------------------------------------------ estimated creatinine clearance is 119 ml/min (by C-G formula based  on Cr of 0.72). ------------------------------------------------------------------------------------------------------------------ No results found for this basename: HGBA1C,  in the last 72 hours ------------------------------------------------------------------------------------------------------------------ No results found for this basename: CHOL, HDL, LDLCALC, TRIG, CHOLHDL, LDLDIRECT,  in the last 72 hours ------------------------------------------------------------------------------------------------------------------ No results found for this basename: TSH, T4TOTAL, FREET3, T3FREE, THYROIDAB,  in the last 72  hours ------------------------------------------------------------------------------------------------------------------ No results found for this basename: VITAMINB12, FOLATE, FERRITIN, TIBC, IRON, RETICCTPCT,  in the last 72 hours  Coagulation profile No results found for this basename: INR, PROTIME,  in the last 168 hours  No results found for this basename: DDIMER,  in the last 72 hours  Cardiac Enzymes No results found for this basename: CK, CKMB, TROPONINI, MYOGLOBIN,  in the last 168 hours ------------------------------------------------------------------------------------------------------------------ No components found with this basename: POCBNP,

## 2012-07-11 NOTE — Progress Notes (Signed)
PT TRANSPORTED TO [facility] ROOM 2106 PER CARE LINK.O2 STATS 100% VIA 100% NRB MASK. LUNG SOUNDS VERY DIMINISHED. SOB WHEN SPEAKING. HR 100 TO 110. TRANSFER REPORT ALSOO CALLED TO RN ON 2100.

## 2012-07-11 NOTE — Progress Notes (Signed)
[facility] PM Coverage Progress Note   Patient slept well most of the night but woke up around 430AM complaining of worsening dyspnea, pleuritic chest pain and requested to speak with me about her pain medications. RN called to report that he has a temp of 100, respirations >30. Stat CXR obtained and ABG. CXR shows significanty worsening ASD, high concern for ARDS. She is still talking and communicating effectively.   Assessment: CAP, on Vanc, Roceph and Azithro-was improving until acute decline this AM. Now in possible ARDS, worsening CXR.  Call placed to PCCM at [facility]. Dr. Levy Pupa has accepted patient in transfer.  Currently patient is on NRB, sats and WOB have improved. Will give Morphine 2mg  IV q2 prn for increased WOB. Will need to determine stability prior to transport - she may need to be intubated.   Anderson Malta, DO Internal Medicine

## 2012-07-11 NOTE — Progress Notes (Signed)
eLink Physician-Brief Progress Note Patient Name: Danielle Chandler DOB: 1986/01/05 MRN: [id number]  Date of Service  07/11/2012   HPI/Events of Note  Unable to schedule thora with IR today, patient requested diet.   eICU Interventions  Regular diet ordered.      YACOUB,WESAM 07/11/2012, 3:22 PM

## 2012-07-12 ENCOUNTER — Inpatient Hospital Stay (HOSPITAL_COMMUNITY): Payer: Medicaid Other

## 2012-07-12 LAB — COMPREHENSIVE METABOLIC PANEL
ALT: 21 U/L (ref 0–35)
Albumin: 2.2 g/dL — ABNORMAL LOW (ref 3.5–5.2)
Alkaline Phosphatase: 131 U/L — ABNORMAL HIGH (ref 39–117)
Potassium: 3.8 mEq/L (ref 3.5–5.1)
Sodium: 138 mEq/L (ref 135–145)
Total Protein: 6.3 g/dL (ref 6.0–8.3)

## 2012-07-12 LAB — BODY FLUID CELL COUNT WITH DIFFERENTIAL
Monocyte-Macrophage-Serous Fluid: 9 % — ABNORMAL LOW (ref 50–90)
Total Nucleated Cell Count, Fluid: 980 cu mm (ref 0–1000)

## 2012-07-12 LAB — CBC
MCHC: 32 g/dL (ref 30.0–36.0)
Platelets: 283 10*3/uL (ref 150–400)
RDW: 15.6 % — ABNORMAL HIGH (ref 11.5–15.5)

## 2012-07-12 LAB — DRUGS OF ABUSE SCREEN W/O ALC, ROUTINE URINE
Amphetamine Screen, Ur: NEGATIVE
Benzodiazepines.: POSITIVE — AB
Cocaine Metabolites: POSITIVE — AB
Opiate Screen, Urine: POSITIVE — AB
Phencyclidine (PCP): NEGATIVE
Propoxyphene: NEGATIVE

## 2012-07-12 LAB — LACTATE DEHYDROGENASE: LDH: 219 U/L (ref 94–250)

## 2012-07-12 LAB — PROTEIN, TOTAL: Total Protein: 6.2 g/dL (ref 6.0–8.3)

## 2012-07-12 LAB — PROCALCITONIN: Procalcitonin: <0.1 ng/mL

## 2012-07-12 LAB — PROTEIN, BODY FLUID

## 2012-07-12 MED ORDER — KETOROLAC TROMETHAMINE 30 MG/ML IJ SOLN
30.0000 mg | Freq: Four times a day (QID) | INTRAMUSCULAR | Status: DC
  Administered 2012-07-12 – 2012-07-15 (×12): 30 mg via INTRAVENOUS
  Filled 2012-07-12 (×17): qty 1

## 2012-07-12 MED ORDER — ACETAMINOPHEN 325 MG PO TABS
650.0000 mg | ORAL_TABLET | ORAL | Status: DC | PRN
  Administered 2012-07-12 – 2012-07-18 (×11): 650 mg via ORAL
  Filled 2012-07-12 (×12): qty 2

## 2012-07-12 NOTE — Progress Notes (Signed)
Fever;   Repeat BC for better yield since now febrile, obtain urine culture; cont antibiotics and administer Tylenol.

## 2012-07-12 NOTE — Progress Notes (Signed)
PULMONARY  / CRITICAL CARE MEDICINE  Name: Danielle Chandler MRN: [id number] DOB: [birth year]    ADMISSION DATE:  07/09/2012 CONSULTATION DATE:  07/11/12  REFERRING MD :  Jeani Hawking PRIMARY SERVICE: PCCM   CHIEF COMPLAINT:  Acute respiratory failure   BRIEF PATIENT DESCRIPTION:  27yo female with hx IV drug use admitted initially to [facility] with CAP.  On 5/12 tx to [facility] for worsening dyspnea, large pleural effusion and ?ARDS.  Overnight developed worsening dyspnea, increasing hypoxia requiring NRB and developed large R pleural effusion and was tx to [facility] for further management and likely thoracentesis.   SIGNIFICANT EVENTS / STUDIES:  5/11 HIV>>> NR 5/11 CTA chest>> Neg for PE, R>L asd, small R effusion   LINES / TUBES: none  CULTURES: BCx2 5/11>>>ng Sputum 5/12>>>  ANTIBIOTICS: Azithro 5/11>>>  Merropenem 5/11>>> Vanc 5/11>>>  VITAL SIGNS: Temp:  [98.6 F (37 C)-101.7 F (38.7 C)] 100.3 F (37.9 C) (05/13 0848) Pulse Rate:  [91-119] 104 (05/13 1200) Resp:  [19-31] 28 (05/13 1200) BP: (92-132)/(49-89) 117/73 mmHg (05/13 1200) SpO2:  [92 %-100 %] 98 % (05/13 1200) HEMODYNAMICS:   VENTILATOR SETTINGS:   INTAKE / OUTPUT: Intake/Output     05/12 0701 - 05/13 0700 05/13 0701 - 05/14 0700   P.O. 888 420   I.V. (mL/kg) 1125 (11.8) 150 (1.6)   IV Piggyback 950 450   Total Intake(mL/kg) 2963 (31) 1020 (10.7)   Urine (mL/kg/hr) 2125 (0.9) 400 (0.6)   Total Output 2125 400   Net +838 +620          PHYSICAL EXAMINATION: General:  Young female, appears uncomfortable, NAD Neuro:  Awake, alert, answers questions appropriately, MAE  HEENT:  Mm dry, venti mask Cardiovascular:  s1s2 rrr Lungs:  resps even, non labored on , decreased BS Rt Abdomen:  Soft, +bs Ext: warm and dry, no edema   LABS:  Recent Labs Lab 07/10/12 0005 07/11/12 0510 07/11/12 0630 07/11/12 0823 07/11/12 1510 07/12/12 0345 07/12/12 0350  HGB 11.8* 10.5*  --   --   --   --  9.1*  WBC  13.5* 10.1  --   --   --   --  9.8  PLT 308 327  --   --   --   --  283  NA 131* 136  --   --   --   --  138  K 4.2 3.8  --   --   --   --  3.8  CL 96 101  --   --   --   --  106  CO2 23 24  --   --   --   --  25  GLUCOSE 108* 104*  --   --   --   --  99  BUN 9 10  --   --   --   --  9  CREATININE 0.82 0.72  --   --   --   --  0.69  CALCIUM 9.8 8.8  --   --   --   --  8.6  AST 17 21  --   --   --   --  14  ALT 16 21  --   --   --   --  21  ALKPHOS 110 141*  --   --   --   --  131*  BILITOT 0.4 0.4  --   --   --   --  0.3  PROT 8.3 7.2  --   --   --   --  6.3  ALBUMIN 3.4* 2.7*  --   --   --   --  2.2*  INR  --   --   --   --  0.99  --   --   LATICACIDVEN  --   --   --   --  0.8  --   --   PROCALCITON  --   --   --   --  <0.10 <0.10  --   PHART  --   --  7.441 7.411  --   --   --   PCO2ART  --   --  33.3* 36.0  --   --   --   PO2ART  --   --  55.2* 262.0*  --   --   --     Recent Labs Lab 07/11/12 0945  GLUCAP 96    CXR:  Dg Chest 1 View  07/12/2012  *RADIOLOGY REPORT*  Clinical Data: Status post right thoracentesis.  CHEST - 1 VIEW  Comparison: Plain film chest 07/12/2012 and CT chest 07/10/2012.  Findings: Large right pleural effusion seen on the prior study appears somewhat decreased.  Right much worse than left airspace disease persists with worsened aeration seen in the left base. There is cardiomegaly.  IMPRESSION:  1.  Some decrease in a large right pleural effusion after thoracentesis.  No pneumothorax. 2.  Right much worse than left airspace disease.  Airspace opacity in the left lung base has increased somewhat since the prior plain films.   Original Report Authenticated By: Holley Dexter, M.D.    Dg Chest Portable 1 View  07/12/2012  *RADIOLOGY REPORT*  Clinical Data: Pneumonia, effusion, shortness of breath  PORTABLE CHEST - 1 VIEW  Comparison: 07/11/2012  Findings: Large right effusion evident with near complete opacification of the right hemithorax.  There remains  some aeration of the right upper lobe.  Slight worsening left base atelectasis/airspace disease.  Left upper lung remains clear.  No left effusion.  Negative for pneumothorax.  Trachea is midline.  IMPRESSION: Persistent large right effusion with right lung collapse / consolidation  Slight worsening left base atelectasis/airspace disease   Original Report Authenticated By: Judie Petit. Miles Costain, M.D.    Dg Chest Port 1 View  07/11/2012  *RADIOLOGY REPORT*  Clinical Data: 27 year old female with shortness of breath and chest pain.  PORTABLE CHEST - 1 VIEW  Comparison: Chest CTA 07/10/2012 and earlier.  Findings: Portable semi upright AP view 0615 hours.  Moderate to large right pleural effusion with a sub total atelectasis of the right lung.  Ventilation in the right lung probably is mildly decreased.  Superimposed consolidation at the right lung base was better demonstrated on the comparison.  Early consolidation or moderate atelectasis at the left base also appears stable.  No pneumothorax.  No definite left effusion.  Cardiac size and mediastinal contours are within normal limits.  Visualized tracheal air column is within normal limits.  IMPRESSION: 1.  Moderate-to-large right pleural effusion with compressive atelectasis of the right lung.  Superimposed right lower lobe consolidation better demonstrated on the comparison and compatible with pneumonia. 2.  Stable left lower lobe moderate atelectasis or  developing pneumonia.   Original Report Authenticated By: Erskine Speed, M.D.    US Thoracentesis Asp Pleural Space W/img Guide  07/12/2012  *RADIOLOGY REPORT*  Clinical Data:  Pneumonia, right-sided pleural effusion.  Request for diagnostic and therapeutic  thoracentesis  ULTRASOUND GUIDED right THORACENTESIS  Comparison:  Previous chest x-rays and CT chest  An ultrasound guided thoracentesis was thoroughly discussed with the patient and questions answered.  The benefits, risks, alternatives and complications were also  discussed.  The patient understands and wishes to proceed with the procedure.  Written consent was obtained.  Ultrasound of the right chest demonstrates a loculated effusion of small to moderate size. Ultrasound was then used to localize and mark an adequate pocket of fluid in the right chest.  The area was then prepped and draped in the normal sterile fashion.  1% Lidocaine was used for local anesthesia.  Under ultrasound guidance a 19 gauge Yueh catheter was introduced.  Thoracentesis was performed.  The catheter was removed and a dressing applied.  Complications:  None immediate  Findings: A total of approximately 300 ml of clear yellow fluid was removed. A fluid sample was sent for laboratory analysis.  IMPRESSION: Partially loculated right pleural effusion.  Successful ultrasound guided right thoracentesis yielding 300 ml of pleural fluid.  Read by Brayton El PA-C   Original Report Authenticated By: Tacey Ruiz, MD      ASSESSMENT / PLAN:  PULMONARY A: acute hypoxic respiratory failure - improving, decreased FiO2 demands.  CTA neg for PE.  CAP - Appears to be mostly consolidation rather than fluid R pleural effusion  P:   Cont O2 and wean as able  IR guided thoracentesis -300 cc removed -fu pleural fluid results Cont abx for CAP - see ID  pulm hygiene as able PRN BD  Consider rpt CT chest if XR does not improve in 24h   CARDIOVASCULAR A:  No active issue  P:  Watch for s/s SIRS   TTE -no veg   RENAL A:  No active issue P:   F/u chem   GASTROINTESTINAL A:  No active issue  P:     HEMATOLOGIC A:  Anemia - mild  P:  F/u cbc   INFECTIOUS A:  CAP  P:   Currently on Azithro, meropenem, Vanc - consider narrow back to rocephin/azithro.  F/u cultures - unable to expectorate any sputum   ENDOCRINE A:  No active issue  P:     NEUROLOGIC A:  Hx IV drug use, polysubstance abuse, mult behavior health admits  Back pain/pleuritic chest pain P:   Cont Toradol Low dose  PRN morphine for pain   Pt/mom updated at bedside 5/13    Ronella Plunk V. 07/12/2012  1:48 PM  07/12/2012, 1:48 PM [facility] [phone]

## 2012-07-12 NOTE — Procedures (Addendum)
Right sided pleural effusion is loculated and not as large as suggested on CXR. Successful US guided right thoracentesis. Yielded of clear yellow fluid. Pt tolerated procedure well. No immediate complications.  Specimen was sent for labs. CXR ordered.  Brayton El PA-C 07/12/2012 11:26 AM

## 2012-07-12 NOTE — Progress Notes (Signed)
Jewelery sent home with pt's  mother, Inetta Fermo. Jewelery includes:  silver colored bracelet, silver colored necklace with heart with clear stones, and ring silver colored with clear stone.

## 2012-07-12 NOTE — Progress Notes (Signed)
Clinical Social Work Department BRIEF PSYCHOSOCIAL ASSESSMENT 07/12/2012  Patient:  Danielle Chandler, Danielle Chandler     Account Number:  [id number]     Admit date:  07/09/2012  Clinical Social Worker:  Margaree Mackintosh  Date/Time:  07/12/2012 12:00 M  Referred by:  Physician  Date Referred:  07/12/2012 Referred for  Substance Abuse   Other Referral:   Interview type:  Patient Other interview type:    PSYCHOSOCIAL DATA Living Status:  FAMILY Admitted from facility:   Level of care:   Primary support name:  Cristen Bredeson: [phone] Primary support relationship to patient:  PARENT Degree of support available:   Unknown.    CURRENT CONCERNS Current Concerns  Substance Abuse   Other Concerns:    SOCIAL WORK ASSESSMENT / PLAN Clinical Social Worker received referral indicating pt has tested positive during her UDS at admission.  CSW reviewed chart and met with pt at bedside.  CSW introduced self, explained role, and provided support.  CSW inquired about pt's drug history; pt denied any recent use of drugs.  Pt stated she last used Marijuana one month prior to admission.  Pt endorsed using cocaine "several years ago" but ceased use "when I (pt) got pregnant".  When asked about her positive UDS, pt stated, "I don't know, I haven't done any drugs a while".  CSW offered resource information; pt declined.  CSW encouraged pt to ask for CSW should she decide she would like resource information.  Pt stated understanding.  CSW staffed wiht RN.  CSW to sign off, please re consult if needed.   Assessment/plan status:  Information/Referral to [facility] Other assessment/ plan:   Information/referral to community resources:   Offered Substance Abuse information; pt declined.    PATIENT'S/FAMILY'S RESPONSE TO PLAN OF CARE: Pt thanked CSW for intervention.

## 2012-07-13 ENCOUNTER — Inpatient Hospital Stay (HOSPITAL_COMMUNITY): Payer: Medicaid Other

## 2012-07-13 DIAGNOSIS — J189 Pneumonia, unspecified organism: Secondary | ICD-10-CM | POA: Diagnosis present

## 2012-07-13 LAB — BENZODIAZEPINE, QUANTITATIVE, URINE
Clonazepam metabolite (GC/LC/MS), ur confirm: NEGATIVE ng/mL
Diazepam (GC/LC/MS), ur confirm: NEGATIVE ng/mL
Flunitrazepam metabolite (GC/LC/MS), ur confirm: NEGATIVE ng/mL
Lorazepam (GC/LC/MS), ur confirm: NEGATIVE ng/mL
Nordiazepam GC/MS Conf: NEGATIVE ng/mL
Oxazepam GC/MS Conf: NEGATIVE ng/mL

## 2012-07-13 LAB — OPIATE, QUANTITATIVE, URINE
6 Monoacetylmorphine, Ur-Confirm: NEGATIVE ng/mL
Morphine, Confirm: 309 ng/mL
Norhydrocodone, Ur: 1638 ng/mL
Noroxycodone, Ur: NEGATIVE ng/mL
Oxymorphone: NEGATIVE ng/mL

## 2012-07-13 LAB — BASIC METABOLIC PANEL
GFR calc Af Amer: >90 mL/min (ref >90)
GFR calc non Af Amer: >90 mL/min (ref >90)
Potassium: 3.5 mEq/L (ref 3.5–5.1)
Sodium: 136 mEq/L (ref 135–145)

## 2012-07-13 LAB — CBC
Hemoglobin: 8.6 g/dL — ABNORMAL LOW (ref 12.0–15.0)
MCHC: 32.5 g/dL (ref 30.0–36.0)
Platelets: 317 10*3/uL (ref 150–400)
RBC: 3.12 MIL/uL — ABNORMAL LOW (ref 3.87–5.11)

## 2012-07-13 LAB — VANCOMYCIN, TROUGH: Vancomycin Tr: 9.4 ug/mL — ABNORMAL LOW (ref 10.0–20.0)

## 2012-07-13 LAB — URINE CULTURE: Colony Count: NO GROWTH

## 2012-07-13 LAB — THC (MARIJUANA), URINE, CONFIRMATION: Marijuana, Ur-Confirmation: 118 ng/mL

## 2012-07-13 MED ORDER — POTASSIUM CHLORIDE CRYS ER 20 MEQ PO TBCR
20.0000 meq | EXTENDED_RELEASE_TABLET | ORAL | Status: AC
  Administered 2012-07-13 (×2): 20 meq via ORAL
  Filled 2012-07-13 (×2): qty 1

## 2012-07-13 MED ORDER — SODIUM CHLORIDE 0.9 % IV SOLN
3.0000 g | Freq: Four times a day (QID) | INTRAVENOUS | Status: DC
  Administered 2012-07-13 – 2012-07-18 (×21): 3 g via INTRAVENOUS
  Filled 2012-07-13 (×23): qty 3

## 2012-07-13 MED ORDER — VANCOMYCIN HCL 10 G IV SOLR
1500.0000 mg | Freq: Three times a day (TID) | INTRAVENOUS | Status: DC
  Administered 2012-07-13 – 2012-07-18 (×14): 1500 mg via INTRAVENOUS
  Filled 2012-07-13 (×18): qty 1500

## 2012-07-13 NOTE — Progress Notes (Addendum)
PULMONARY  / CRITICAL CARE MEDICINE  Name: Danielle Chandler MRN: [id number] DOB: [birth year]    ADMISSION DATE:  07/09/2012 CONSULTATION DATE:  07/11/12  REFERRING MD :  Jeani Hawking PRIMARY SERVICE: PCCM   CHIEF COMPLAINT:  Acute respiratory failure   BRIEF PATIENT DESCRIPTION:  27yo female with hx IV drug use admitted initially to [facility] with CAP.  On 5/12 tx to [facility] for worsening dyspnea, large pleural effusion and ?ARDS.  Overnight developed worsening dyspnea, increasing hypoxia requiring NRB and developed large R pleural effusion and was tx to [facility] for further management and likely thoracentesis.   SIGNIFICANT EVENTS / STUDIES:  5/11 HIV>>> NR 5/11 CTA chest>> Neg for PE, R>L asd, small R effusion  5/13 350 pleural fluid - neutrophilic exudate  LINES / TUBES: none  CULTURES: BCx2 5/11>>>ng Sputum 5/12>>>ng 5/13 pl fluid >>  ANTIBIOTICS: Azithro 5/11>>>  Merropenem 5/11>>> Vanc 5/11>>>  VITAL SIGNS: Temp:  [98.1 F (36.7 C)-99.3 F (37.4 C)] 98.2 F (36.8 C) (05/14 0841) Pulse Rate:  [87-129] 98 (05/14 0800) Resp:  [18-30] 24 (05/14 0800) BP: (100-132)/(56-89) 119/73 mmHg (05/14 0800) SpO2:  [93 %-100 %] 95 % (05/14 0800) HEMODYNAMICS:   VENTILATOR SETTINGS:   INTAKE / OUTPUT: Intake/Output     05/13 0701 - 05/14 0700 05/14 0701 - 05/15 0700   P.O. 420    I.V. (mL/kg) 900 (9.4)    IV Piggyback 1150 250   Total Intake(mL/kg) 2470 (25.8) 250 (2.6)   Urine (mL/kg/hr) 1850 (0.8) 200 (0.8)   Total Output 1850 200   Net +620 +50        Stool Occurrence 1 x      SUBJ - afebrile, c/o pain -some improved No dyspnea  PHYSICAL EXAMINATION: General:  Young female, appears uncomfortable, NAD Neuro:  Awake, alert, answers questions appropriately, MAE  HEENT:  Mm dry, venti mask Cardiovascular:  s1s2 rrr Lungs:  resps even, non labored on [place], decreased BS Rt, lt base crackles Abdomen:  Soft, +bs Ext: warm and dry, no edema   LABS:  Recent Labs Lab  07/10/12 0005 07/11/12 0510 07/11/12 0630 07/11/12 0823 07/11/12 1510 07/12/12 0345 07/12/12 0350 07/13/12 0355  HGB 11.8* 10.5*  --   --   --   --  9.1* 8.6*  WBC 13.5* 10.1  --   --   --   --  9.8 9.5  PLT 308 327  --   --   --   --  283 317  NA 131* 136  --   --   --   --  138 136  K 4.2 3.8  --   --   --   --  3.8 3.5  CL 96 101  --   --   --   --  106 102  CO2 23 24  --   --   --   --  25 26  GLUCOSE 108* 104*  --   --   --   --  99 99  BUN 9 10  --   --   --   --  9 9  CREATININE 0.82 0.72  --   --   --   --  0.69 0.59  CALCIUM 9.8 8.8  --   --   --   --  8.6 8.4  AST 17 21  --   --   --   --  14  --   ALT 16 21  --   --   --   --  21  --   ALKPHOS 110 141*  --   --   --   --  131*  --   BILITOT 0.4 0.4  --   --   --   --  0.3  --   PROT 8.3 7.2  --   --   --   --  6.3  6.2  --   ALBUMIN 3.4* 2.7*  --   --   --   --  2.2*  --   INR  --   --   --   --  0.99  --   --   --   LATICACIDVEN  --   --   --   --  0.8  --   --   --   PROCALCITON  --   --   --   --  <0.10 <0.10  --  <0.10  PHART  --   --  7.441 7.411  --   --   --   --   PCO2ART  --   --  33.3* 36.0  --   --   --   --   PO2ART  --   --  55.2* 262.0*  --   --   --   --     Recent Labs Lab 07/11/12 0945  GLUCAP 96    CXR:  Dg Chest 1 View  07/12/2012   *RADIOLOGY REPORT*  Clinical Data: Status post right thoracentesis.  CHEST - 1 VIEW  Comparison: Plain film chest 07/12/2012 and CT chest 07/10/2012.  Findings: Large right pleural effusion seen on the prior study appears somewhat decreased.  Right much worse than left airspace disease persists with worsened aeration seen in the left base. There is cardiomegaly.  IMPRESSION:  1.  Some decrease in a large right pleural effusion after thoracentesis.  No pneumothorax. 2.  Right much worse than left airspace disease.  Airspace opacity in the left lung base has increased somewhat since the prior plain films.   Original Report Authenticated By: Holley Dexter, M.D.    Dg Chest Port 1 View  07/13/2012   *RADIOLOGY REPORT*  Clinical Data: Pneumonia.  Effusion.  PORTABLE CHEST - 1 VIEW  Comparison: 07/12/2012  Findings: 0459 hours.  Low volume film. The cardiopericardial silhouette is enlarged.  The by basilar collapse / consolidation, right greater than left, persist.  Right pleural effusion again noted without substantial change. Telemetry leads overlie the chest.  IMPRESSION: Low volume film with right greater than left airspace disease.  No substantial interval change.   Original Report Authenticated By: Kennith Center, M.D.   Dg Chest Portable 1 View  07/12/2012   *RADIOLOGY REPORT*  Clinical Data: Pneumonia, effusion, shortness of breath  PORTABLE CHEST - 1 VIEW  Comparison: 07/11/2012  Findings: Large right effusion evident with near complete opacification of the right hemithorax.  There remains some aeration of the right upper lobe.  Slight worsening left base atelectasis/airspace disease.  Left upper lung remains clear.  No left effusion.  Negative for pneumothorax.  Trachea is midline.  IMPRESSION: Persistent large right effusion with right lung collapse / consolidation  Slight worsening left base atelectasis/airspace disease   Original Report Authenticated By: Judie Petit. Shick, M.D.   US Thoracentesis Asp Pleural Space W/img Guide  07/12/2012   *RADIOLOGY REPORT*  Clinical Data:  Pneumonia, right-sided pleural effusion.  Request for diagnostic and therapeutic thoracentesis  ULTRASOUND GUIDED right THORACENTESIS  Comparison:  Previous chest x-rays and CT chest  An ultrasound guided thoracentesis was thoroughly discussed with the patient and questions answered.  The benefits, risks, alternatives and complications were also discussed.  The patient understands and wishes to proceed with the procedure.  Written consent was obtained.  Ultrasound of the right chest demonstrates a loculated effusion of small to moderate size. Ultrasound was then used to localize and mark an  adequate pocket of fluid in the right chest.  The area was then prepped and draped in the normal sterile fashion.  1% Lidocaine was used for local anesthesia.  Under ultrasound guidance a 19 gauge Yueh catheter was introduced.  Thoracentesis was performed.  The catheter was removed and a dressing applied.  Complications:  None immediate  Findings: A total of approximately 300 ml of clear yellow fluid was removed. A fluid sample was sent for laboratory analysis.  IMPRESSION: Partially loculated right pleural effusion.  Successful ultrasound guided right thoracentesis yielding 300 ml of pleural fluid.  Read by Brayton El PA-C   Original Report Authenticated By: Tacey Ruiz, MD     ASSESSMENT / PLAN:  PULMONARY A: acute hypoxic respiratory failure - improving, decreased FiO2 demands.  CTA neg for PE.  CAP - Appears to be mostly consolidation rather than fluid R pleural effusion -350 pleural fluid - neutrophilic exudate (not empyema) P:   Cont O2 and wean as able  Cont abx for HCAP - see ID  pulm hygiene as able PRN BD  -rpt CT chest for loculations  CARDIOVASCULAR A:  No active issue  P:  Watch for s/s SIRS   TTE -no veg    HEMATOLOGIC A:  Anemia - mild  P:  F/u cbc   INFECTIOUS A:  CAP vs aspiration / parapneumonic effusion P:   Currently on Azithro, meropenem, Vanc - narrow to unasyn/vanc, not legionella so can dc azithro F/u pl fluid cultures    NEUROLOGIC A:  Hx IV drug use, polysubstance abuse, mult behavior health admits  Back pain/pleuritic chest pain P:   Cont Toradol Low dose PRN morphine for pain   Pt updated at bedside 5/13 OK to transfer to tele Can transfer to triad if CT ok   Muhsin Doris V. 07/13/2012  9:32 AM  07/13/2012, 9:32 AM [place] [phone]

## 2012-07-13 NOTE — Progress Notes (Signed)
ANTIBIOTIC CONSULT NOTE -  Follow up  Pharmacy Consult for Vancomycin and Unasyn Indication: pneumonia; parapneumonic effusion  Allergies  Allergen Reactions  . Nubain (Nalbuphine Hcl) Other (See Comments)    Patient said it made her hurt really badly, also burning sensation throughout body   Patient Measurements: Height: 5\' 3"  (160 cm) Weight: 210 lb 12.2 oz (95.6 kg) IBW/kg (Calculated) : 52.4  Vital Signs: Temp: 98.2 F (36.8 C) (05/14 0841) Temp src: Oral (05/14 0841) BP: 119/73 mmHg (05/14 0800) Pulse Rate: 98 (05/14 0800) Intake/Output from previous day: 05/13 0701 - 05/14 0700 In: 2470 [P.O.:420; I.V.:900; IV Piggyback:1150] Out: 1850 [Urine:1850] Intake/Output from this shift: Total I/O In: 250 [IV Piggyback:250] Out: 200 [Urine:200]  Labs:  Recent Labs  07/11/12 0510 07/12/12 0350 07/13/12 0355  WBC 10.1 9.8 9.5  HGB 10.5* 9.1* 8.6*  PLT 327 283 317  CREATININE 0.72 0.69 0.59   Estimated Creatinine Clearance: 118.3 ml/min (by C-G formula based on Cr of 0.59).  Recent Labs  07/13/12 0939  VANCOTROUGH 9.4*     Microbiology: Recent Results (from the past 720 hour(s))  CULTURE, BLOOD (ROUTINE X 2)     Status: None   Collection Time    07/10/12  4:04 AM      Result Value Range Status   Specimen Description BLOOD RIGHT ANTECUBITAL   Final   Special Requests BOTTLES DRAWN AEROBIC AND ANAEROBIC 8CC   Final   Culture NO GROWTH 3 DAYS   Final   Report Status PENDING   Incomplete  CULTURE, BLOOD (ROUTINE X 2)     Status: None   Collection Time    07/10/12  4:09 AM      Result Value Range Status   Specimen Description BLOOD RIGHT HAND   Final   Special Requests BOTTLES DRAWN AEROBIC AND ANAEROBIC 7CC   Final   Culture NO GROWTH 3 DAYS   Final   Report Status PENDING   Incomplete  MRSA PCR SCREENING     Status: Abnormal   Collection Time    07/10/12  7:08 AM      Result Value Range Status   MRSA by PCR POSITIVE (*) NEGATIVE Final   Comment:             The GeneXpert MRSA Assay (FDA     approved for NASAL specimens     only), is one component of a     comprehensive MRSA colonization     surveillance program. It is not     intended to diagnose MRSA     infection nor to guide or     monitor treatment for     MRSA infections.     CRITICAL RESULT CALLED TO, READ BACK BY AND VERIFIED WITH:     GORDON, A. AT [id number] ON 07/10/12 BY PRUITT, C.  CULTURE, BLOOD (ROUTINE X 2)     Status: None   Collection Time    07/12/12  1:15 AM      Result Value Range Status   Specimen Description BLOOD LEFT ARM   Final   Special Requests BOTTLES DRAWN AEROBIC ONLY 10CC   Final   Culture  Setup Time 07/12/2012 10:02   Final   Culture     Final   Value:        BLOOD CULTURE RECEIVED NO GROWTH TO DATE CULTURE WILL BE HELD FOR 5 DAYS BEFORE ISSUING A FINAL NEGATIVE REPORT   Report Status PENDING   Incomplete  CULTURE,  BLOOD (ROUTINE X 2)     Status: None   Collection Time    07/12/12  1:27 AM      Result Value Range Status   Specimen Description BLOOD RIGHT ARM   Final   Special Requests BOTTLES DRAWN AEROBIC ONLY 10CC   Final   Culture  Setup Time 07/12/2012 10:00   Final   Culture     Final   Value:        BLOOD CULTURE RECEIVED NO GROWTH TO DATE CULTURE WILL BE HELD FOR 5 DAYS BEFORE ISSUING A FINAL NEGATIVE REPORT   Report Status PENDING   Incomplete  BODY FLUID CULTURE     Status: None   Collection Time    07/12/12 11:30 AM      Result Value Range Status   Specimen Description PLEURAL FLUID RIGHT   Final   Special Requests FLUID   Final   Gram Stain     Final   Value: RARE WBC PRESENT, PREDOMINANTLY PMN     NO ORGANISMS SEEN   Culture NO GROWTH   Final   Report Status PENDING   Incomplete    Estimated Creatinine Clearance: 118.3 ml/min (by C-G formula based on Cr of 0.59).  Assessment: 27 y.o. Female on Unasyn/Vanc (total abx Day #5) for CAP/parapneumonic effusion. Tm 100.3. On Z-pack and Bactrim PTA. Noted plan for 7 days of abx.  Vancomycin trough 9.4 mcg/ml on 1gm IV q8h -subtherapeutic. Renal function remains stable with good UOP.  5/11 Azith>>5/14 5/11 Meropenem>>5/14 5/11 Vanc>>5/18 5/14 Unasyn>>  5/11 Bldx2>>ngtd 5/13 pleural fluid>>ngtd 5/13 Urine>> 5/13 Bldx2>>ngtd  Goal of Therapy:  Vancomycin trough level 15-20 mcg/ml  Plan:  1) Change Vancomycin to 1.5gm IV q8h. Stops 5/18. Pt will not need another trough unless renal function changes or LOT extended.  2) Begin Unasyn 3 gm IV q6h. 3) Will f/u renal function, cx, and stop date for Unasyn  Christoper Fabian, PharmD, BCPS Clinical pharmacist, pager [phone] 07/13/2012,11:16 AM

## 2012-07-13 NOTE — Progress Notes (Signed)
[facility] ADULT ICU REPLACEMENT PROTOCOL FOR AM LAB REPLACEMENT ONLY  The patient does apply for the [facility] Adult ICU Electrolyte Replacment Protocol based on the criteria listed below:   1. Is GFR >/= 40 ml/min? yes  Patient's GFR today is >90 2. Is urine output >/= 0.5 ml/kg/hr for the last 6 hours? yes Patient's UOP is .5 ml/kg/hr 3. Is BUN < 60 mg/dL? yes  Patient's BUN today is 9 4. Abnormal electrolyte K 3.5 5. Ordered repletion with: per protocol 6. If a panic level lab has been reported, has the CCM MD in charge been notified? yes Physician:  Dr Mervin Hack 07/13/2012 4:49 AM

## 2012-07-13 NOTE — Progress Notes (Signed)
Chaplain Note:  Chaplain visited with pt who was sitting up in a chair next to bed.  Pt was sad but at this time was not ready to enter into an in-depth conversation.  Chaplain provided spiritual comfort and support.  At pt's request, chaplain prayed with pt.  Pt expressed appreciation for chaplain support. Chaplain will follow up as needed.  07/13/12 1300  Clinical Encounter Type  Visited With Patient  Visit Type Spiritual support  Referral From Nurse  Spiritual Encounters  Spiritual Needs Prayer;Emotional  Stress Factors  Patient Stress Factors Major life changes  Family Stress Factors None identified (No family present at this time.)  Verdie Shire, Chaplain [phone]

## 2012-07-14 ENCOUNTER — Inpatient Hospital Stay (HOSPITAL_COMMUNITY): Payer: Medicaid Other

## 2012-07-14 LAB — BASIC METABOLIC PANEL
BUN: 9 mg/dL (ref 6–23)
Calcium: 8.6 mg/dL (ref 8.4–10.5)
Creatinine, Ser: 0.54 mg/dL (ref 0.50–1.10)
GFR calc Af Amer: >90 mL/min (ref >90)
GFR calc non Af Amer: >90 mL/min (ref >90)
Glucose, Bld: 96 mg/dL (ref 70–99)
Potassium: 3.7 mEq/L (ref 3.5–5.1)

## 2012-07-14 LAB — CBC
MCH: 28.1 pg (ref 26.0–34.0)
MCHC: 32.8 g/dL (ref 30.0–36.0)
MCV: 85.5 fL (ref 78.0–100.0)
Platelets: 342 10*3/uL (ref 150–400)

## 2012-07-14 MED ORDER — HYDROMORPHONE HCL PF 1 MG/ML IJ SOLN: 1.0000 mg | Freq: Once | INTRAMUSCULAR | Status: AC

## 2012-07-14 MED ORDER — HYDROMORPHONE HCL 2 MG PO TABS
4.0000 mg | ORAL_TABLET | ORAL | Status: DC | PRN
  Administered 2012-07-14 – 2012-07-15 (×4): 4 mg via ORAL
  Filled 2012-07-14: qty 1
  Filled 2012-07-14 (×3): qty 2

## 2012-07-14 MED ORDER — HYDROMORPHONE HCL 2 MG PO TABS
2.0000 mg | ORAL_TABLET | ORAL | Status: DC | PRN
  Administered 2012-07-14 (×3): 2 mg via ORAL
  Filled 2012-07-14: qty 1
  Filled 2012-07-14: qty 2
  Filled 2012-07-14: qty 1

## 2012-07-14 MED ORDER — HYDROMORPHONE HCL PF 1 MG/ML IJ SOLN
INTRAMUSCULAR | Status: AC
  Administered 2012-07-14: 1 mg
  Filled 2012-07-14: qty 1

## 2012-07-14 NOTE — Progress Notes (Signed)
I was called by the bedside nurse; the patient complaining of increased pain on the R chest; Dilaudid po not helping.   Plan: obtain CXR and  Increase Dilaudid to 4 mg prn.

## 2012-07-14 NOTE — Progress Notes (Signed)
PULMONARY  / CRITICAL CARE MEDICINE  Name: Danielle Chandler MRN: [id number] DOB: [birth year]    ADMISSION DATE:  07/09/2012 CONSULTATION DATE:  07/11/12  REFERRING MD :  Jeani Hawking PRIMARY SERVICE: PCCM   CHIEF COMPLAINT:  Acute respiratory failure   BRIEF PATIENT DESCRIPTION:  27 y/o female with hx IV drug use admitted initially to [facility] with CAP.  On 5/12 tx to [facility] for worsening dyspnea, large pleural effusion and questionable ARDS.  Overnight developed worsening dyspnea, increasing hypoxia requiring NRB and developed large R pleural effusion and was tx to [facility] for further management and likely thoracentesis.   SIGNIFICANT EVENTS / STUDIES:  5/11 - HIV>>> NR 5/11 - CTA chest>> Neg for PE, R>L asd, small R effusion  5/13 - 350 pleural fluid - neutrophilic exudate 4/69 - CT Chest>>>multilobar PNA, ? R perihilar soft tissue mass, minimal loculated R effusion, medial pleural thickening  LINES / TUBES: none  CULTURES: BCx2 5/11>>>ng Sputum 5/12>>>ng Pleural Fluid 5/13>>>no AFB Pleural Fluid culture 5/13>>> UC 5/13>>>neg  ANTIBIOTICS: Azithro 5/11>>>514 Merropenem 5/11>>>5/14 Vanc 5/11>>> Unasyn 5/14>>  VITAL SIGNS: Temp:  [97.8 F (36.6 C)-99.4 F (37.4 C)] 98.7 F (37.1 C) (05/15 0652) Pulse Rate:  [78-103] 94 (05/15 0652) Resp:  [17-29] 22 (05/15 0652) BP: (93-129)/(53-98) 116/74 mmHg (05/15 0652) SpO2:  [93 %-100 %] 96 % (05/15 0652)  INTAKE / OUTPUT: Intake/Output     05/14 0701 - 05/15 0700 05/15 0701 - 05/16 0700   P.O.     I.V. (mL/kg) 235 (2.5)    IV Piggyback 1550    Total Intake(mL/kg) 1785 (18.7)    Urine (mL/kg/hr) 1100 (0.5)    Total Output 1100     Net +685          Urine Occurrence 1 x      SUBJECTIVE:  Pt reports chest wall pain, mild SOB.    PHYSICAL EXAMINATION: General:  Young female, appears uncomfortable, NAD Neuro:  Awake, alert, answers questions appropriately, MAE  HEENT:  Mm dry, venti mask Cardiovascular:  s1s2 rrr Lungs:   resps even, non labored on [place], decreased BS Rt, lt base crackles Abdomen:  Soft, +bs Ext: warm and dry, no edema   LABS:  Recent Labs Lab 07/10/12 0005 07/11/12 0510 07/11/12 0630 07/11/12 0823 07/11/12 1510 07/12/12 0345 07/12/12 0350 07/13/12 0355 07/14/12 0530  HGB 11.8* 10.5*  --   --   --   --  9.1* 8.6* 8.5*  WBC 13.5* 10.1  --   --   --   --  9.8 9.5 9.1  PLT 308 327  --   --   --   --  283 317 342  NA 131* 136  --   --   --   --  138 136 136  K 4.2 3.8  --   --   --   --  3.8 3.5 3.7  CL 96 101  --   --   --   --  106 102 102  CO2 23 24  --   --   --   --  25 26 23   GLUCOSE 108* 104*  --   --   --   --  99 99 96  BUN 9 10  --   --   --   --  9 9 9   CREATININE 0.82 0.72  --   --   --   --  0.69 0.59 0.54  CALCIUM 9.8 8.8  --   --   --   --  8.6 8.4 8.6  AST 17 21  --   --   --   --  14  --   --   ALT 16 21  --   --   --   --  21  --   --   ALKPHOS 110 141*  --   --   --   --  131*  --   --   BILITOT 0.4 0.4  --   --   --   --  0.3  --   --   PROT 8.3 7.2  --   --   --   --  6.3  6.2  --   --   ALBUMIN 3.4* 2.7*  --   --   --   --  2.2*  --   --   INR  --   --   --   --  0.99  --   --   --   --   LATICACIDVEN  --   --   --   --  0.8  --   --   --   --   PROCALCITON  --   --   --   --  <0.10 <0.10  --  <0.10  --   PHART  --   --  7.441 7.411  --   --   --   --   --   PCO2ART  --   --  33.3* 36.0  --   --   --   --   --   PO2ART  --   --  55.2* 262.0*  --   --   --   --   --     Recent Labs Lab 07/11/12 0945  GLUCAP 96    CXR:  Dg Chest 1 View  07/12/2012   *RADIOLOGY REPORT*  Clinical Data: Status post right thoracentesis.  CHEST - 1 VIEW  Comparison: Plain film chest 07/12/2012 and CT chest 07/10/2012.  Findings: Large right pleural effusion seen on the prior study appears somewhat decreased.  Right much worse than left airspace disease persists with worsened aeration seen in the left base. There is cardiomegaly.  IMPRESSION:  1.  Some decrease in a large  right pleural effusion after thoracentesis.  No pneumothorax. 2.  Right much worse than left airspace disease.  Airspace opacity in the left lung base has increased somewhat since the prior plain films.   Original Report Authenticated By: Holley Dexter, M.D.   Ct Chest Wo Contrast  07/13/2012   *RADIOLOGY REPORT*  Clinical Data: Right pleural effusion, pneumonia, chest pain and shortness of breath.  CT CHEST WITHOUT CONTRAST  Technique:  Multidetector CT imaging of the chest was performed following the standard protocol without IV contrast.  Comparison: 07/10/2012.  Findings: No pathologically enlarged mediastinal or axillary lymph nodes.  Hilar regions are difficult to definitively evaluate without IV contrast.  Heart is at the upper limits of normal in size.  No pericardial effusion.  Small right pleural effusion, partially loculated.  There may be some associated pleural thickening posteromedially.  Amount of pleural fluid has increased slightly in the interval. Collapse/consolidation in the right middle lobe and right lower lobe with less severe disease in the left lower lobe.  There is some confluence at the right hilum (example image 21).  Airway is unremarkable.  Incidental imaging of the upper abdomen shows no acute findings. No worrisome lytic or sclerotic lesions.  IMPRESSION:  1.  Findings are most consistent with multilobar pneumonia, with slight progression from 07/10/2012. 2.  Difficult to exclude a right perihilar soft tissue mass. Follow-up to clearing is recommended. 3.  Minimal loculated right pleural effusion with slight interval increase in size from 07/10/2012 and possible posterior medial pleural thickening.  The possibility of a developing empyema is considered.   Original Report Authenticated By: Leanna Battles, M.D.   Dg Chest Port 1 View  07/13/2012   *RADIOLOGY REPORT*  Clinical Data: Pneumonia.  Effusion.  PORTABLE CHEST - 1 VIEW  Comparison: 07/12/2012  Findings: 0459 hours.   Low volume film. The cardiopericardial silhouette is enlarged.  The by basilar collapse / consolidation, right greater than left, persist.  Right pleural effusion again noted without substantial change. Telemetry leads overlie the chest.  IMPRESSION: Low volume film with right greater than left airspace disease.  No substantial interval change.   Original Report Authenticated By: Kennith Center, M.D.   US Thoracentesis Asp Pleural Space W/img Guide  07/12/2012   *RADIOLOGY REPORT*  Clinical Data:  Pneumonia, right-sided pleural effusion.  Request for diagnostic and therapeutic thoracentesis  ULTRASOUND GUIDED right THORACENTESIS  Comparison:  Previous chest x-rays and CT chest  An ultrasound guided thoracentesis was thoroughly discussed with the patient and questions answered.  The benefits, risks, alternatives and complications were also discussed.  The patient understands and wishes to proceed with the procedure.  Written consent was obtained.  Ultrasound of the right chest demonstrates a loculated effusion of small to moderate size. Ultrasound was then used to localize and mark an adequate pocket of fluid in the right chest.  The area was then prepped and draped in the normal sterile fashion.  1% Lidocaine was used for local anesthesia.  Under ultrasound guidance a 19 gauge Yueh catheter was introduced.  Thoracentesis was performed.  The catheter was removed and a dressing applied.  Complications:  None immediate  Findings: A total of approximately 300 ml of clear yellow fluid was removed. A fluid sample was sent for laboratory analysis.  IMPRESSION: Partially loculated right pleural effusion.  Successful ultrasound guided right thoracentesis yielding 300 ml of pleural fluid.  Read by Brayton El PA-C   Original Report Authenticated By: Tacey Ruiz, MD     ASSESSMENT / PLAN:    Acute hypoxic respiratory failure - improving, decreased FiO2 demands.  CTA neg for PE.  CAP - Appears to be mostly  consolidation rather than fluid.  Repeat CT 5/14 with multi-lobar PNA.  TTE negative for vegetation R pleural effusion - 350 pleural fluid - neutrophilic exudate (not empyema), however areas of pleural thickening & concern for possible development of empyema on CT 5/14  P:   Cont O2 and wean as able  Cont current abx, rec 14 days abx, optimal for 7 IV Aggressive pulmonary hygiene PRN BD  Monitor for s/s SIRS Continue abx Follow pleural fluid culture Follow pleural effusion on CXR, if increased, could consider IR thoracentesis but at present, not enough fluid to tap   Anemia - mild   P:  Monitor CBC   Hx IV drug use, polysubstance abuse, mult behavior health admits  Back pain/pleuritic chest pain  P:   Cont Toradol D/c morphine Change IV dilaudid to PO Recommend NA as outpatient and counseling   Transfer to [facility] 5/16 0700.  PCCM will sign off.  Please call if we can be of further assistance.    Canary Brim, NP-C [facility] Pgr: [phone] or [phone]  Patient seen and examined, agree with above note.  I dictated the care and orders written for this patient under my direction.  Orianna Biskup G Jaslynne Dahan, MD [phone] 

## 2012-07-15 ENCOUNTER — Inpatient Hospital Stay (HOSPITAL_COMMUNITY): Payer: Medicaid Other

## 2012-07-15 ENCOUNTER — Encounter (HOSPITAL_COMMUNITY): Payer: Self-pay | Admitting: Radiology

## 2012-07-15 DIAGNOSIS — J90 Pleural effusion, not elsewhere classified: Secondary | ICD-10-CM

## 2012-07-15 LAB — BASIC METABOLIC PANEL
Calcium: 8.5 mg/dL (ref 8.4–10.5)
Creatinine, Ser: 0.71 mg/dL (ref 0.50–1.10)
GFR calc non Af Amer: >90 mL/min (ref >90)
Glucose, Bld: 95 mg/dL (ref 70–99)
Sodium: 135 mEq/L (ref 135–145)

## 2012-07-15 LAB — CBC
MCH: 27.7 pg (ref 26.0–34.0)
MCV: 84.9 fL (ref 78.0–100.0)
Platelets: 387 10*3/uL (ref 150–400)
RBC: 3.11 MIL/uL — ABNORMAL LOW (ref 3.87–5.11)
RDW: 15.2 % (ref 11.5–15.5)
WBC: 11 10*3/uL — ABNORMAL HIGH (ref 4.0–10.5)

## 2012-07-15 LAB — CULTURE, BLOOD (ROUTINE X 2): Culture: NO GROWTH

## 2012-07-15 MED ORDER — HYDROMORPHONE HCL PF 2 MG/ML IJ SOLN: 1.0000 mg | INTRAMUSCULAR | Status: DC | PRN

## 2012-07-15 MED ORDER — HYDROMORPHONE HCL PF 1 MG/ML IJ SOLN
0.5000 mg | Freq: Once | INTRAMUSCULAR | Status: AC
  Administered 2012-07-15: 0.5 mg via INTRAVENOUS
  Filled 2012-07-15: qty 1

## 2012-07-15 MED ORDER — HYDROMORPHONE HCL PF 1 MG/ML IJ SOLN
1.0000 mg | INTRAMUSCULAR | Status: DC | PRN
  Administered 2012-07-15 – 2012-07-18 (×21): 1 mg via INTRAVENOUS
  Filled 2012-07-15 (×22): qty 1

## 2012-07-15 MED ORDER — PANTOPRAZOLE SODIUM 40 MG IV SOLR
40.0000 mg | Freq: Two times a day (BID) | INTRAVENOUS | Status: DC
  Administered 2012-07-15 (×2): 40 mg via INTRAVENOUS
  Filled 2012-07-15 (×4): qty 40

## 2012-07-15 MED ORDER — IOHEXOL 300 MG/ML  SOLN
100.0000 mL | Freq: Once | INTRAMUSCULAR | Status: AC | PRN
  Administered 2012-07-15: 100 mL via INTRAVENOUS

## 2012-07-15 MED ORDER — HYDROMORPHONE HCL 2 MG PO TABS
4.0000 mg | ORAL_TABLET | ORAL | Status: DC | PRN
  Administered 2012-07-18 – 2012-07-19 (×6): 4 mg via ORAL
  Filled 2012-07-15 (×6): qty 2

## 2012-07-15 MED ORDER — IOHEXOL 300 MG/ML  SOLN
25.0000 mL | INTRAMUSCULAR | Status: AC
  Administered 2012-07-15 (×2): 25 mL via ORAL

## 2012-07-15 NOTE — Progress Notes (Signed)
[facility] PROGRESS NOTE  Danielle Chandler WUJ:[id number] DOB: 06-27-1985 DOA: 07/09/2012 PCP: No PCP Per Patient  Assessment/Plan: PNA, right side pleural effusion: per pulmonary more consolidation than pleural fluid. TTE negative for vegetation. No empyema. Pleural fluid no growth to date. ? developing empyema on CT 5-14. Repeat chest x ray in 24 hours. Pleural centesis as needed. Follow WBC trend.  Acute hypoxic respiratory failure - Secondary to PNA. improving. CTA neg for PE. Anemia Hx IV drug use, polysubstance abuse, mult behavior health admits  Back pain/pleuritic chest pain, epigastric pain: stop Toradol to avoid gastritis. Check CT abdomen. IV pain meds for 24 hours. Start protonix IV.   SIGNIFICANT EVENTS / STUDIES:  5/11 - HIV>>> NR  5/11 - CTA chest>> Neg for PE, R>L asd, small R effusion  5/13 - 350 pleural fluid - neutrophilic exudate  5/14 - CT Chest>>>multilobar PNA, ? R perihilar soft tissue mass, minimal loculated R effusion, medial pleural thickening  Code Status: Full Family Communication: care discussed with patient.  Disposition Plan: home when stable   Consultants:  CCM sign off  Procedures: Right side thoracentesis.  CULTURES:  BCx2 5/11>>>ng  Sputum 5/12>>>ng  Pleural Fluid 5/13>>>no AFB  Pleural Fluid culture 5/13>>>  UC 5/13>>>neg   Antibiotics: Vancomycin 5-11 Unasyn 5- 14 Azithro 5/11>>>514  Merropenem 5/11>>>5/14   HPI/Subjective: Complaining of epigastric , right side chest pain.   Objective: Filed Vitals:   07/14/12 1335 07/14/12 2038 07/15/12 0527 07/15/12 1325  BP: 123/77 110/75 115/64 118/57  Pulse: 90 88 96 96  Temp: 98.6 F (37 C) 98.5 F (36.9 C) 98.7 F (37.1 C) 98.7 F (37.1 C)  TempSrc: Oral Oral Oral Oral  Resp: 21 20 22 20   Height:      Weight:      SpO2: 99% 91% 94% 100%    Intake/Output Summary (Last 24 hours) at 07/15/12 1842 Last data filed at 07/15/12 1545  Gross per 24 hour  Intake    480 ml   Output   1625 ml  Net  -1145 ml   Filed Weights   07/10/12 0640 07/11/12 0500 07/11/12 1000  Weight: 91.7 kg (202 lb 2.6 oz) 96.6 kg (212 lb 15.4 oz) 95.6 kg (210 lb 12.2 oz)    Exam:   General: mild distress due to pain  Cardiovascular: S1 S @ RRR  Respiratory: crackles right side  Abdomen: BS present, soft, epigastric tenderness.   Musculoskeletal: trace edema.   Data Reviewed: Basic Metabolic Panel:  Recent Labs Lab 07/11/12 0510 07/12/12 0350 07/13/12 0355 07/14/12 0530 07/15/12 0415  NA 136 138 136 136 135  K 3.8 3.8 3.5 3.7 3.8  CL 101 106 102 102 100  CO2 24 25 26 23 24   GLUCOSE 104* 99 99 96 95  BUN 10 9 9 9 9   CREATININE 0.72 0.69 0.59 0.54 0.71  CALCIUM 8.8 8.6 8.4 8.6 8.5   Liver Function Tests:  Recent Labs Lab 07/10/12 0005 07/11/12 0510 07/12/12 0350  AST 17 21 14   ALT 16 21 21   ALKPHOS 110 141* 131*  BILITOT 0.4 0.4 0.3  PROT 8.3 7.2 6.3  6.2  ALBUMIN 3.4* 2.7* 2.2*   No results found for this basename: LIPASE, AMYLASE,  in the last 168 hours No results found for this basename: AMMONIA,  in the last 168 hours CBC:  Recent Labs Lab 07/10/12 0005 07/11/12 0510 07/12/12 0350 07/13/12 0355 07/14/12 0530 07/15/12 0415  WBC 13.5* 10.1 9.8 9.5 9.1 11.0*  NEUTROABS 11.1*  --   --   --   --   --   HGB 11.8* 10.5* 9.1* 8.6* 8.5* 8.6*  HCT 35.5* 31.0* 28.4* 26.5* 25.9* 26.4*  MCV 86.2 86.4 86.1 84.9 85.5 84.9  PLT 308 327 283 317 342 387   Cardiac Enzymes: No results found for this basename: CKTOTAL, CKMB, CKMBINDEX, TROPONINI,  in the last 168 hours BNP (last 3 results) No results found for this basename: PROBNP,  in the last 8760 hours CBG:  Recent Labs Lab 07/11/12 0945  GLUCAP 96    Recent Results (from the past 240 hour(s))  CULTURE, BLOOD (ROUTINE X 2)     Status: None   Collection Time    07/10/12  4:04 AM      Result Value Range Status   Specimen Description BLOOD RIGHT ANTECUBITAL   Final   Special Requests  BOTTLES DRAWN AEROBIC AND ANAEROBIC 8CC   Final   Culture NO GROWTH 5 DAYS   Final   Report Status 07/15/2012 FINAL   Final  CULTURE, BLOOD (ROUTINE X 2)     Status: None   Collection Time    07/10/12  4:09 AM      Result Value Range Status   Specimen Description BLOOD RIGHT HAND   Final   Special Requests BOTTLES DRAWN AEROBIC AND ANAEROBIC 7CC   Final   Culture NO GROWTH 5 DAYS   Final   Report Status 07/15/2012 FINAL   Final  MRSA PCR SCREENING     Status: Abnormal   Collection Time    07/10/12  7:08 AM      Result Value Range Status   MRSA by PCR POSITIVE (*) NEGATIVE Final   Comment:            The GeneXpert MRSA Assay (FDA     approved for NASAL specimens     only), is one component of a     comprehensive MRSA colonization     surveillance program. It is not     intended to diagnose MRSA     infection nor to guide or     monitor treatment for     MRSA infections.     CRITICAL RESULT CALLED TO, READ BACK BY AND VERIFIED WITH:     GORDON, A. AT 1610RU ON 07/10/12 BY PRUITT, C.  CULTURE, BLOOD (ROUTINE X 2)     Status: None   Collection Time    07/12/12  1:15 AM      Result Value Range Status   Specimen Description BLOOD LEFT ARM   Final   Special Requests BOTTLES DRAWN AEROBIC ONLY 10CC   Final   Culture  Setup Time 07/12/2012 10:02   Final   Culture     Final   Value:        BLOOD CULTURE RECEIVED NO GROWTH TO DATE CULTURE WILL BE HELD FOR 5 DAYS BEFORE ISSUING A FINAL NEGATIVE REPORT   Report Status PENDING   Incomplete  CULTURE, BLOOD (ROUTINE X 2)     Status: None   Collection Time    07/12/12  1:27 AM      Result Value Range Status   Specimen Description BLOOD RIGHT ARM   Final   Special Requests BOTTLES DRAWN AEROBIC ONLY 10CC   Final   Culture  Setup Time 07/12/2012 10:00   Final   Culture     Final   Value:        BLOOD CULTURE  RECEIVED NO GROWTH TO DATE CULTURE WILL BE HELD FOR 5 DAYS BEFORE ISSUING A FINAL NEGATIVE REPORT   Report Status PENDING    Incomplete  URINE CULTURE     Status: None   Collection Time    07/12/12  4:07 AM      Result Value Range Status   Specimen Description URINE, CLEAN CATCH   Final   Special Requests merem, vanc   Final   Culture  Setup Time 07/12/2012 04:19   Final   Colony Count NO GROWTH   Final   Culture NO GROWTH   Final   Report Status 07/13/2012 FINAL   Final  AFB CULTURE WITH SMEAR     Status: None   Collection Time    07/12/12 11:30 AM      Result Value Range Status   Specimen Description PLEURAL FLUID RIGHT   Final   Special Requests FLUID   Final   ACID FAST SMEAR NO ACID FAST BACILLI SEEN   Final   Culture     Final   Value: CULTURE WILL BE EXAMINED FOR 6 WEEKS BEFORE ISSUING A FINAL REPORT   Report Status PENDING   Incomplete  BODY FLUID CULTURE     Status: None   Collection Time    07/12/12 11:30 AM      Result Value Range Status   Specimen Description PLEURAL FLUID RIGHT   Final   Special Requests FLUID   Final   Gram Stain     Final   Value: RARE WBC PRESENT, PREDOMINANTLY PMN     NO ORGANISMS SEEN   Culture NO GROWTH 2 DAYS   Final   Report Status PENDING   Incomplete     Studies: Dg Chest 2 View  07/15/2012   *RADIOLOGY REPORT*  Clinical Data: Shortness of breath, right pleural effusion.  CHEST - 2 VIEW  Comparison: Jul 14, 2012.  Findings: Cardiomediastinal silhouette appears normal.  Minimal left pleural effusion is noted with minimal atelectasis in the left lung base.  No change is noted in large right pleural effusion compared to prior exam with associated pneumonia or atelectasis. No pneumothorax is noted.  IMPRESSION: No change in large right pleural effusion with probable associated pneumonia or atelectasis.   Original Report Authenticated By: Lupita Raider.,  M.D.   Ct Abdomen Pelvis W Contrast  07/15/2012   *RADIOLOGY REPORT*  Clinical Data: Epigastric pain radiating to the right shoulder.  CT ABDOMEN AND PELVIS WITH CONTRAST  Technique:  Multidetector CT  imaging of the abdomen and pelvis was performed following the standard protocol during bolus administration of intravenous contrast.  Contrast: OMNIPAQUE IOHEXOL 300 MG/ML  SOLN  Comparison: 07/08/2011CT of the abdomen or pelvis.  More recent comparison to chest CT from 07/13/2012  Findings: Images which include the lung bases showed dense airspace consolidation in the right lower lobe with associated small pleural effusion.  There is patchy airspace disease in the left lower lobe and posterior lingula.  No focal abnormality in the liver or spleen.  The stomach, duodenum, pancreas, and adrenal glands are unremarkable.  The gallbladder is surgically absent.  Kidneys are normal bilaterally.  No abdominal aortic aneurysm.  There is no lymphadenopathy in the abdomen.  Imaging through the pelvis shows a small amount of intraperitoneal free fluid.  3.9 cm posterior left adnexal cystic lesion is identified.  No definite right adnexal mass.  The uterus is unremarkable.  No pelvic sidewall lymphadenopathy.  Bladder is  unremarkable.  No appreciable diverticular change in the colon.  No colonic diverticulitis.  Terminal ileum is normal. The appendix is not visualized, but there is no edema or inflammation in the region of the cecum.  Gas locules in the subcutaneous fat of the right anterior abdominal wall are presumably secondary to an injection site.  Bone windows reveal no worrisome lytic or sclerotic osseous lesions.  IMPRESSION: Incompletely visualized large area of airspace consolidation in the right lower lobe with more patchy airspace consolidation in the lingula and left lower lobe.  These changes are associated with small right pleural effusion, incompletely visualized.  3.9 cm cystic lesion in the left adnexal space.  Pelvic ultrasound recommended to further characterize.   Original Report Authenticated By: Kennith Center, M.D.   Dg Chest Port 1 View  07/14/2012   *RADIOLOGY REPORT*  Clinical Data: Right side  chest pain.  Right pleural effusion.  PORTABLE CHEST - 1 VIEW  Comparison: CT and plain film chest 07/13/2012.  Findings: Large right pleural effusion and airspace disease persist.  There is a small left pleural effusion with some basilar airspace disease.  Heart size is normal.  IMPRESSION: No marked change in right much worse than left pleural effusions airspace disease.   Original Report Authenticated By: Holley Dexter, M.D.    Scheduled Meds: . ampicillin-sulbactam (UNASYN) IV  3 g Intravenous Q6H  . enoxaparin (LOVENOX) injection  40 mg Subcutaneous Q24H  . guaiFENesin  1,200 mg Oral BID  . lidocaine  1 patch Transdermal Q24H  . LORazepam  1 mg Oral QHS  . pantoprazole (PROTONIX) IV  40 mg Intravenous Q12H  . vancomycin  1,500 mg Intravenous Q8H   Continuous Infusions: . sodium chloride 10 mL/hr at 07/13/12 1900    Principal Problem:   Community acquired pneumonia Active Problems:   Heroin dependence   Chest pain   Polysubstance abuse   Depression, major   Drug addiction syndrome   Parapneumonic effusion    Time spent: 35 minutes    Umi Mainor  [facility] Pager [phone]. If 7PM-7AM, please contact night-coverage at [email or web address], password [facility] 07/15/2012, 6:42 PM  LOS: 6 days

## 2012-07-16 DIAGNOSIS — F1910 Other psychoactive substance abuse, uncomplicated: Secondary | ICD-10-CM

## 2012-07-16 LAB — BASIC METABOLIC PANEL
BUN: 6 mg/dL (ref 6–23)
Chloride: 99 mEq/L (ref 96–112)
GFR calc Af Amer: >90 mL/min (ref >90)
GFR calc non Af Amer: >90 mL/min (ref >90)
Potassium: 3.8 mEq/L (ref 3.5–5.1)
Sodium: 136 mEq/L (ref 135–145)

## 2012-07-16 LAB — CBC
HCT: 26.7 % — ABNORMAL LOW (ref 36.0–46.0)
MCHC: 33 g/dL (ref 30.0–36.0)
Platelets: 463 10*3/uL — ABNORMAL HIGH (ref 150–400)
RDW: 14.9 % (ref 11.5–15.5)
WBC: 10.5 10*3/uL (ref 4.0–10.5)

## 2012-07-16 LAB — BODY FLUID CULTURE

## 2012-07-16 MED ORDER — POLYETHYLENE GLYCOL 3350 17 G PO PACK
17.0000 g | PACK | Freq: Two times a day (BID) | ORAL | Status: DC
  Filled 2012-07-16 (×8): qty 1

## 2012-07-16 MED ORDER — PANTOPRAZOLE SODIUM 40 MG PO TBEC
40.0000 mg | DELAYED_RELEASE_TABLET | Freq: Two times a day (BID) | ORAL | Status: DC
  Administered 2012-07-16 – 2012-07-19 (×6): 40 mg via ORAL
  Filled 2012-07-16 (×6): qty 1

## 2012-07-16 MED ORDER — FERROUS SULFATE 325 (65 FE) MG PO TABS
325.0000 mg | ORAL_TABLET | Freq: Three times a day (TID) | ORAL | Status: DC
  Administered 2012-07-16 – 2012-07-19 (×7): 325 mg via ORAL
  Filled 2012-07-16 (×11): qty 1

## 2012-07-16 NOTE — Progress Notes (Addendum)
[facility] PROGRESS NOTE  HENSLEY AZIZ JXB:[id number] DOB: 28-May-1985 DOA: 07/09/2012 PCP: No PCP Per Patient  Assessment/Plan: PNA, right side pleural effusion: per pulmonary more consolidation than pleural fluid. TTE negative for vegetation. No empyema. Cytology negative for malignancy, Pleural fluid no growth to date. ? developing empyema on CT 5-14. Repeat chest x ray 5-18. Pleural centesis as needed. WBC trending down. Continue with Unasyn day 4 and Vancomycin day 7. Acute hypoxic respiratory failure - Secondary to PNA. improving. CTA neg for PE. Anemia: start ferrous sulfate. Check anemia panel.  Hx IV drug use, polysubstance abuse, mult behavior health admits  Back pain/pleuritic chest pain, epigastric pain: stop Toradol to avoid gastritis. CT abdomen negative, incidental left annexal space cyst.  Continue with protonix IV. Pain better. Will consider discontinue IV pain medication in 24 hour. Add miralax for bowel regimen.  3.9 cm cystic lesion in the left adnexal space. Check pelvic US.    SIGNIFICANT EVENTS / STUDIES:  5/11 - HIV>>> NR  5/11 - CTA chest>> Neg for PE, R>L asd, small R effusion  5/13 - 350 pleural fluid - neutrophilic exudate  5/14 - CT Chest>>>multilobar PNA, ? R perihilar soft tissue mass, minimal loculated R effusion, medial pleural thickening  Code Status: Full Family Communication: care discussed with patient.  Disposition Plan: home when stable   Consultants:  CCM sign off  Procedures: Right side thoracentesis.  CULTURES:  BCx2 5/11>>>ng  Sputum 5/12>>>ng  Pleural Fluid 5/13>>>no AFB  Pleural Fluid culture 5/13>>>  UC 5/13>>>neg   Antibiotics: Vancomycin 5-11 Unasyn 5- 14 Azithro 5/11>>>514  Merropenem 5/11>>>5/14   HPI/Subjective: Epigastric and chest pain improved. Breathing better.   Objective: Filed Vitals:   07/15/12 1325 07/15/12 1950 07/16/12 0439 07/16/12 0602  BP: 118/57 125/83 111/69   Pulse: 96 106 86   Temp: 98.7  F (37.1 C) 98.9 F (37.2 C) 99 F (37.2 C)   TempSrc: Oral Oral Oral   Resp: 20 24 24 20   Height:      Weight:      SpO2: 100% 94% 98%     Intake/Output Summary (Last 24 hours) at 07/16/12 1253 Last data filed at 07/16/12 0946  Gross per 24 hour  Intake   1440 ml  Output   3225 ml  Net  -1785 ml   Filed Weights   07/10/12 0640 07/11/12 0500 07/11/12 1000  Weight: 91.7 kg (202 lb 2.6 oz) 96.6 kg (212 lb 15.4 oz) 95.6 kg (210 lb 12.2 oz)    Exam:   General: mild distress due to pain  Cardiovascular: S1 S @ RRR  Respiratory: crackles right side  Abdomen: BS present, soft, epigastric tenderness.   Musculoskeletal: trace edema.   Data Reviewed: Basic Metabolic Panel:  Recent Labs Lab 07/12/12 0350 07/13/12 0355 07/14/12 0530 07/15/12 0415 07/16/12 0350  NA 138 136 136 135 136  K 3.8 3.5 3.7 3.8 3.8  CL 106 102 102 100 99  CO2 25 26 23 24 28   GLUCOSE 99 99 96 95 89  BUN 9 9 9 9 6   CREATININE 0.69 0.59 0.54 0.71 0.63  CALCIUM 8.6 8.4 8.6 8.5 9.2   Liver Function Tests:  Recent Labs Lab 07/10/12 0005 07/11/12 0510 07/12/12 0350  AST 17 21 14   ALT 16 21 21   ALKPHOS 110 141* 131*  BILITOT 0.4 0.4 0.3  PROT 8.3 7.2 6.3  6.2  ALBUMIN 3.4* 2.7* 2.2*   No results found for this basename: LIPASE, AMYLASE,  in  the last 168 hours No results found for this basename: AMMONIA,  in the last 168 hours CBC:  Recent Labs Lab 07/10/12 0005  07/12/12 0350 07/13/12 0355 07/14/12 0530 07/15/12 0415 07/16/12 0350  WBC 13.5*  < > 9.8 9.5 9.1 11.0* 10.5  NEUTROABS 11.1*  --   --   --   --   --   --   HGB 11.8*  < > 9.1* 8.6* 8.5* 8.6* 8.8*  HCT 35.5*  < > 28.4* 26.5* 25.9* 26.4* 26.7*  MCV 86.2  < > 86.1 84.9 85.5 84.9 84.0  PLT 308  < > 283 317 342 387 463*  < > = values in this interval not displayed. Cardiac Enzymes: No results found for this basename: CKTOTAL, CKMB, CKMBINDEX, TROPONINI,  in the last 168 hours BNP (last 3 results) No results found for  this basename: PROBNP,  in the last 8760 hours CBG:  Recent Labs Lab 07/11/12 0945  GLUCAP 96    Recent Results (from the past 240 hour(s))  CULTURE, BLOOD (ROUTINE X 2)     Status: None   Collection Time    07/10/12  4:04 AM      Result Value Range Status   Specimen Description BLOOD RIGHT ANTECUBITAL   Final   Special Requests BOTTLES DRAWN AEROBIC AND ANAEROBIC 8CC   Final   Culture NO GROWTH 5 DAYS   Final   Report Status 07/15/2012 FINAL   Final  CULTURE, BLOOD (ROUTINE X 2)     Status: None   Collection Time    07/10/12  4:09 AM      Result Value Range Status   Specimen Description BLOOD RIGHT HAND   Final   Special Requests BOTTLES DRAWN AEROBIC AND ANAEROBIC 7CC   Final   Culture NO GROWTH 5 DAYS   Final   Report Status 07/15/2012 FINAL   Final  MRSA PCR SCREENING     Status: Abnormal   Collection Time    07/10/12  7:08 AM      Result Value Range Status   MRSA by PCR POSITIVE (*) NEGATIVE Final   Comment:            The GeneXpert MRSA Assay (FDA     approved for NASAL specimens     only), is one component of a     comprehensive MRSA colonization     surveillance program. It is not     intended to diagnose MRSA     infection nor to guide or     monitor treatment for     MRSA infections.     CRITICAL RESULT CALLED TO, READ BACK BY AND VERIFIED WITH:     GORDON, A. AT 1610RU ON 07/10/12 BY PRUITT, C.  CULTURE, BLOOD (ROUTINE X 2)     Status: None   Collection Time    07/12/12  1:15 AM      Result Value Range Status   Specimen Description BLOOD LEFT ARM   Final   Special Requests BOTTLES DRAWN AEROBIC ONLY 10CC   Final   Culture  Setup Time 07/12/2012 10:02   Final   Culture     Final   Value:        BLOOD CULTURE RECEIVED NO GROWTH TO DATE CULTURE WILL BE HELD FOR 5 DAYS BEFORE ISSUING A FINAL NEGATIVE REPORT   Report Status PENDING   Incomplete  CULTURE, BLOOD (ROUTINE X 2)     Status: None   Collection  Time    07/12/12  1:27 AM      Result Value Range  Status   Specimen Description BLOOD RIGHT ARM   Final   Special Requests BOTTLES DRAWN AEROBIC ONLY 10CC   Final   Culture  Setup Time 07/12/2012 10:00   Final   Culture     Final   Value:        BLOOD CULTURE RECEIVED NO GROWTH TO DATE CULTURE WILL BE HELD FOR 5 DAYS BEFORE ISSUING A FINAL NEGATIVE REPORT   Report Status PENDING   Incomplete  URINE CULTURE     Status: None   Collection Time    07/12/12  4:07 AM      Result Value Range Status   Specimen Description URINE, CLEAN CATCH   Final   Special Requests merem, vanc   Final   Culture  Setup Time 07/12/2012 04:19   Final   Colony Count NO GROWTH   Final   Culture NO GROWTH   Final   Report Status 07/13/2012 FINAL   Final  AFB CULTURE WITH SMEAR     Status: None   Collection Time    07/12/12 11:30 AM      Result Value Range Status   Specimen Description PLEURAL FLUID RIGHT   Final   Special Requests FLUID   Final   ACID FAST SMEAR NO ACID FAST BACILLI SEEN   Final   Culture     Final   Value: CULTURE WILL BE EXAMINED FOR 6 WEEKS BEFORE ISSUING A FINAL REPORT   Report Status PENDING   Incomplete  BODY FLUID CULTURE     Status: None   Collection Time    07/12/12 11:30 AM      Result Value Range Status   Specimen Description PLEURAL FLUID RIGHT   Final   Special Requests FLUID   Final   Gram Stain     Final   Value: RARE WBC PRESENT, PREDOMINANTLY PMN     NO ORGANISMS SEEN   Culture NO GROWTH 2 DAYS   Final   Report Status PENDING   Incomplete     Studies: Dg Chest 2 View  07/15/2012   *RADIOLOGY REPORT*  Clinical Data: Shortness of breath, right pleural effusion.  CHEST - 2 VIEW  Comparison: Jul 14, 2012.  Findings: Cardiomediastinal silhouette appears normal.  Minimal left pleural effusion is noted with minimal atelectasis in the left lung base.  No change is noted in large right pleural effusion compared to prior exam with associated pneumonia or atelectasis. No pneumothorax is noted.  IMPRESSION: No change in  large right pleural effusion with probable associated pneumonia or atelectasis.   Original Report Authenticated By: Lupita Raider.,  M.D.   Ct Abdomen Pelvis W Contrast  07/15/2012   *RADIOLOGY REPORT*  Clinical Data: Epigastric pain radiating to the right shoulder.  CT ABDOMEN AND PELVIS WITH CONTRAST  Technique:  Multidetector CT imaging of the abdomen and pelvis was performed following the standard protocol during bolus administration of intravenous contrast.  Contrast: OMNIPAQUE IOHEXOL 300 MG/ML  SOLN  Comparison: 07/08/2011CT of the abdomen or pelvis.  More recent comparison to chest CT from 07/13/2012  Findings: Images which include the lung bases showed dense airspace consolidation in the right lower lobe with associated small pleural effusion.  There is patchy airspace disease in the left lower lobe and posterior lingula.  No focal abnormality in the liver or spleen.  The stomach, duodenum, pancreas, and  adrenal glands are unremarkable.  The gallbladder is surgically absent.  Kidneys are normal bilaterally.  No abdominal aortic aneurysm.  There is no lymphadenopathy in the abdomen.  Imaging through the pelvis shows a small amount of intraperitoneal free fluid.  3.9 cm posterior left adnexal cystic lesion is identified.  No definite right adnexal mass.  The uterus is unremarkable.  No pelvic sidewall lymphadenopathy.  Bladder is unremarkable.  No appreciable diverticular change in the colon.  No colonic diverticulitis.  Terminal ileum is normal. The appendix is not visualized, but there is no edema or inflammation in the region of the cecum.  Gas locules in the subcutaneous fat of the right anterior abdominal wall are presumably secondary to an injection site.  Bone windows reveal no worrisome lytic or sclerotic osseous lesions.  IMPRESSION: Incompletely visualized large area of airspace consolidation in the right lower lobe with more patchy airspace consolidation in the lingula and left lower  lobe.  These changes are associated with small right pleural effusion, incompletely visualized.  3.9 cm cystic lesion in the left adnexal space.  Pelvic ultrasound recommended to further characterize.   Original Report Authenticated By: Kennith Center, M.D.   Dg Chest Port 1 View  07/14/2012   *RADIOLOGY REPORT*  Clinical Data: Right side chest pain.  Right pleural effusion.  PORTABLE CHEST - 1 VIEW  Comparison: CT and plain film chest 07/13/2012.  Findings: Large right pleural effusion and airspace disease persist.  There is a small left pleural effusion with some basilar airspace disease.  Heart size is normal.  IMPRESSION: No marked change in right much worse than left pleural effusions airspace disease.   Original Report Authenticated By: Holley Dexter, M.D.    Scheduled Meds: . ampicillin-sulbactam (UNASYN) IV  3 g Intravenous Q6H  . enoxaparin (LOVENOX) injection  40 mg Subcutaneous Q24H  . guaiFENesin  1,200 mg Oral BID  . lidocaine  1 patch Transdermal Q24H  . LORazepam  1 mg Oral QHS  . pantoprazole  40 mg Oral BID  . vancomycin  1,500 mg Intravenous Q8H   Continuous Infusions: . sodium chloride 10 mL/hr at 07/13/12 1900    Principal Problem:   Community acquired pneumonia Active Problems:   Heroin dependence   Chest pain   Polysubstance abuse   Depression, major   Drug addiction syndrome   Parapneumonic effusion    Time spent: 35 minutes    Yukari Flax  [facility] Pager [phone]. If 7PM-7AM, please contact night-coverage at [email or web address], password [email or web address] 07/16/2012, 12:53 PM  LOS: 7 days

## 2012-07-16 NOTE — Progress Notes (Signed)
ANTIBIOTIC CONSULT NOTE -  Follow up  Pharmacy Consult for Vancomycin and Unasyn Indication: pneumonia; parapneumonic effusion  Allergies  Allergen Reactions  . Nubain (Nalbuphine Hcl) Other (See Comments)    Patient said it made her hurt really badly, also burning sensation throughout body   Patient Measurements: Height: 5\' 3"  (160 cm) Weight: 210 lb 12.2 oz (95.6 kg) IBW/kg (Calculated) : 52.4  Vital Signs: Temp: 99 F (37.2 C) (05/17 0439) Temp src: Oral (05/17 0439) BP: 111/69 mmHg (05/17 0439) Pulse Rate: 86 (05/17 0439) Intake/Output from previous day: 05/16 0701 - 05/17 0700 In: 2020 [P.O.:720; IV Piggyback:1300] Out: 3425 [Urine:3425] Intake/Output from this shift:    Labs:  Recent Labs  07/14/12 0530 07/15/12 0415 07/16/12 0350  WBC 9.1 11.0* 10.5  HGB 8.5* 8.6* 8.8*  PLT 342 387 463*  CREATININE 0.54 0.71 0.63   Estimated Creatinine Clearance: 118.3 ml/min (by C-G formula based on Cr of 0.63). No results found for this basename: VANCOTROUGH, Leodis Binet, VANCORANDOM, GENTTROUGH, GENTPEAK, GENTRANDOM, TOBRATROUGH, TOBRAPEAK, TOBRARND, AMIKACINPEAK, AMIKACINTROU, AMIKACIN,  in the last 72 hours   Microbiology: Recent Results (from the past 720 hour(s))  CULTURE, BLOOD (ROUTINE X 2)     Status: None   Collection Time    07/10/12  4:04 AM      Result Value Range Status   Specimen Description BLOOD RIGHT ANTECUBITAL   Final   Special Requests BOTTLES DRAWN AEROBIC AND ANAEROBIC 8CC   Final   Culture NO GROWTH 5 DAYS   Final   Report Status 07/15/2012 FINAL   Final  CULTURE, BLOOD (ROUTINE X 2)     Status: None   Collection Time    07/10/12  4:09 AM      Result Value Range Status   Specimen Description BLOOD RIGHT HAND   Final   Special Requests BOTTLES DRAWN AEROBIC AND ANAEROBIC 7CC   Final   Culture NO GROWTH 5 DAYS   Final   Report Status 07/15/2012 FINAL   Final  MRSA PCR SCREENING     Status: Abnormal   Collection Time    07/10/12  7:08 AM   Result Value Range Status   MRSA by PCR POSITIVE (*) NEGATIVE Final   Comment:            The GeneXpert MRSA Assay (FDA     approved for NASAL specimens     only), is one component of a     comprehensive MRSA colonization     surveillance program. It is not     intended to diagnose MRSA     infection nor to guide or     monitor treatment for     MRSA infections.     CRITICAL RESULT CALLED TO, READ BACK BY AND VERIFIED WITH:     GORDON, A. AT 1610RU ON 07/10/12 BY PRUITT, C.  CULTURE, BLOOD (ROUTINE X 2)     Status: None   Collection Time    07/12/12  1:15 AM      Result Value Range Status   Specimen Description BLOOD LEFT ARM   Final   Special Requests BOTTLES DRAWN AEROBIC ONLY 10CC   Final   Culture  Setup Time 07/12/2012 10:02   Final   Culture     Final   Value:        BLOOD CULTURE RECEIVED NO GROWTH TO DATE CULTURE WILL BE HELD FOR 5 DAYS BEFORE ISSUING A FINAL NEGATIVE REPORT   Report Status PENDING  Incomplete  CULTURE, BLOOD (ROUTINE X 2)     Status: None   Collection Time    07/12/12  1:27 AM      Result Value Range Status   Specimen Description BLOOD RIGHT ARM   Final   Special Requests BOTTLES DRAWN AEROBIC ONLY 10CC   Final   Culture  Setup Time 07/12/2012 10:00   Final   Culture     Final   Value:        BLOOD CULTURE RECEIVED NO GROWTH TO DATE CULTURE WILL BE HELD FOR 5 DAYS BEFORE ISSUING A FINAL NEGATIVE REPORT   Report Status PENDING   Incomplete  URINE CULTURE     Status: None   Collection Time    07/12/12  4:07 AM      Result Value Range Status   Specimen Description URINE, CLEAN CATCH   Final   Special Requests merem, vanc   Final   Culture  Setup Time 07/12/2012 04:19   Final   Colony Count NO GROWTH   Final   Culture NO GROWTH   Final   Report Status 07/13/2012 FINAL   Final  AFB CULTURE WITH SMEAR     Status: None   Collection Time    07/12/12 11:30 AM      Result Value Range Status   Specimen Description PLEURAL FLUID RIGHT   Final   Special  Requests FLUID   Final   ACID FAST SMEAR NO ACID FAST BACILLI SEEN   Final   Culture     Final   Value: CULTURE WILL BE EXAMINED FOR 6 WEEKS BEFORE ISSUING A FINAL REPORT   Report Status PENDING   Incomplete  BODY FLUID CULTURE     Status: None   Collection Time    07/12/12 11:30 AM      Result Value Range Status   Specimen Description PLEURAL FLUID RIGHT   Final   Special Requests FLUID   Final   Gram Stain     Final   Value: RARE WBC PRESENT, PREDOMINANTLY PMN     NO ORGANISMS SEEN   Culture NO GROWTH 2 DAYS   Final   Report Status PENDING   Incomplete    Estimated Creatinine Clearance: 118.3 ml/min (by C-G formula based on Cr of 0.63).  Assessment: 27 y.o. Female on Unasyn/Vanc for CAP/parapneumonic effusion. Tm 99. On Z-pack and Bactrim PTA. Vancomycin trough 9.4 mcg/ml on 1gm IV q8h -subtherapeutic. Renal function remains stable with good UOP.  5/11 Azith>>5/14 5/11 Meropenem>>5/14 5/11 Vanc>>5/18 5/14 Unasyn>>  5/11 Bldx2>>ngtd 5/13 pleural fluid>>ngtd 5/13 Urine>> 5/13 Bldx2>>ngtd  Goal of Therapy:  Vancomycin trough level 15-20 mcg/ml  Plan:   Cont vanc 1.5g IV q8 Cont unasyn at current dose. F/u with PO abx

## 2012-07-16 NOTE — Progress Notes (Signed)
Patient did not want to ambulate this evening. Will continue to monitor.

## 2012-07-16 NOTE — Progress Notes (Signed)
Patient has been in a considerable amount of pain tonight.  Patient has received her pain medications and pain patch as ordered.  Patient has been repositioned in bed frequently to make comfortable. Will continue to monitor.

## 2012-07-17 ENCOUNTER — Inpatient Hospital Stay (HOSPITAL_COMMUNITY): Payer: Medicaid Other

## 2012-07-17 LAB — BASIC METABOLIC PANEL
BUN: 8 mg/dL (ref 6–23)
CO2: 27 mEq/L (ref 19–32)
Chloride: 100 mEq/L (ref 96–112)
GFR calc non Af Amer: >90 mL/min (ref >90)
Glucose, Bld: 98 mg/dL (ref 70–99)
Potassium: 4 mEq/L (ref 3.5–5.1)
Sodium: 135 mEq/L (ref 135–145)

## 2012-07-17 LAB — CBC
HCT: 28.2 % — ABNORMAL LOW (ref 36.0–46.0)
Hemoglobin: 9.3 g/dL — ABNORMAL LOW (ref 12.0–15.0)
MCHC: 33 g/dL (ref 30.0–36.0)
RBC: 3.33 MIL/uL — ABNORMAL LOW (ref 3.87–5.11)
WBC: 9.7 10*3/uL (ref 4.0–10.5)

## 2012-07-17 LAB — RETICULOCYTES
RBC.: 3.33 MIL/uL — ABNORMAL LOW (ref 3.87–5.11)
Retic Count, Absolute: 63.3 10*3/uL (ref 19.0–186.0)

## 2012-07-17 LAB — FOLATE: Folate: 10.1 ng/mL

## 2012-07-17 LAB — IRON AND TIBC: UIBC: 207 ug/dL (ref 125–400)

## 2012-07-17 MED ORDER — HYDROMORPHONE HCL PF 1 MG/ML IJ SOLN
0.5000 mg | Freq: Once | INTRAMUSCULAR | Status: AC
  Administered 2012-07-17: 0.5 mg via INTRAVENOUS

## 2012-07-17 NOTE — Progress Notes (Addendum)
[facility] PROGRESS NOTE  Danielle Chandler BJY:[id number] DOB: [birth year] DOA: 07/09/2012 PCP: No PCP Per Patient  Assessment/Plan: PNA, right side pleural effusion: per pulmonary more consolidation than pleural fluid. TTE negative for vegetation. No empyema. Cytology negative for malignancy, Pleural fluid no growth to date. ? developing empyema on CT 5-14. Repeat chest x ray 5-18 stable bilateral air space diseases. WBC trending down. Continue with Unasyn day 5 and Vancomycin day 8. Will ask pulmonary to re evaluate.  Acute hypoxic respiratory failure - Secondary to PNA. improving. CTA neg for PE. Anemia, iron deficiency: Continue with ferrous sulfate. Iron 21, ferritin 187. Hx IV drug use, polysubstance abuse, mult behavior health admits  Back pain/pleuritic chest pain, epigastric pain: stop Toradol to avoid gastritis. CT abdomen negative, incidental left annexal space cyst.  Continue with protonix IV. Pain better. miralax for bowel regimen.  3.9 cm cystic lesion in the left adnexal space.  pelvic US pending.     SIGNIFICANT EVENTS / STUDIES:  5/11 - HIV>>> NR  5/11 - CTA chest>> Neg for PE, R>L asd, small R effusion  5/13 - 350 pleural fluid - neutrophilic exudate  5/14 - CT Chest>>>multilobar PNA, ? R perihilar soft tissue mass, minimal loculated R effusion, medial pleural thickening  Code Status: Full Family Communication: care discussed with patient.  Disposition Plan: home when stable   Consultants:  CCM sign off  Procedures: Right side thoracentesis.  CULTURES:  BCx2 5/11>>>ng  Sputum 5/12>>>ng  Pleural Fluid 5/13>>>no AFB  Pleural Fluid culture 5/13>>>  UC 5/13>>>neg   Antibiotics: Vancomycin 5-11 Unasyn 5- 14 Azithro 5/11>>>514  Merropenem 5/11>>>5/14   HPI/Subjective: Still with significant pain.   Objective: Filed Vitals:   07/16/12 1427 07/16/12 1929 07/17/12 0513 07/17/12 1402  BP: 114/72 139/77 100/60 124/67  Pulse: 80 99 82 80  Temp: 98.9 F  (37.2 C) 99.7 F (37.6 C) 98.7 F (37.1 C) 98.6 F (37 C)  TempSrc: Oral Oral Oral Oral  Resp:  18 18 18   Height:      Weight:      SpO2: 98% 99% 94% 98%    Intake/Output Summary (Last 24 hours) at 07/17/12 1701 Last data filed at 07/17/12 0548  Gross per 24 hour  Intake    840 ml  Output   1675 ml  Net   -835 ml   Filed Weights   07/10/12 0640 07/11/12 0500 07/11/12 1000  Weight: 91.7 kg (202 lb 2.6 oz) 96.6 kg (212 lb 15.4 oz) 95.6 kg (210 lb 12.2 oz)    Exam:   General: mild distress due to pain  Cardiovascular: S1 S @ RRR  Respiratory: crackles right side  Abdomen: BS present, soft, epigastric tenderness.   Musculoskeletal: trace edema.   Data Reviewed: Basic Metabolic Panel:  Recent Labs Lab 07/13/12 0355 07/14/12 0530 07/15/12 0415 07/16/12 0350 07/17/12 0510  NA 136 136 135 136 135  K 3.5 3.7 3.8 3.8 4.0  CL 102 102 100 99 100  CO2 26 23 24 28 27   GLUCOSE 99 96 95 89 98  BUN 9 9 9 6 8   CREATININE 0.59 0.54 0.71 0.63 0.61  CALCIUM 8.4 8.6 8.5 9.2 9.4   Liver Function Tests:  Recent Labs Lab 07/11/12 0510 07/12/12 0350  AST 21 14  ALT 21 21  ALKPHOS 141* 131*  BILITOT 0.4 0.3  PROT 7.2 6.3  6.2  ALBUMIN 2.7* 2.2*   No results found for this basename: LIPASE, AMYLASE,  in the last 168  hours No results found for this basename: AMMONIA,  in the last 168 hours CBC:  Recent Labs Lab 07/13/12 0355 07/14/12 0530 07/15/12 0415 07/16/12 0350 07/17/12 0510  WBC 9.5 9.1 11.0* 10.5 9.7  HGB 8.6* 8.5* 8.6* 8.8* 9.3*  HCT 26.5* 25.9* 26.4* 26.7* 28.2*  MCV 84.9 85.5 84.9 84.0 84.7  PLT 317 342 387 463* 510*   Cardiac Enzymes: No results found for this basename: CKTOTAL, CKMB, CKMBINDEX, TROPONINI,  in the last 168 hours BNP (last 3 results) No results found for this basename: PROBNP,  in the last 8760 hours CBG:  Recent Labs Lab 07/11/12 0945  GLUCAP 96    Recent Results (from the past 240 hour(s))  CULTURE, BLOOD (ROUTINE X  2)     Status: None   Collection Time    07/10/12  4:04 AM      Result Value Range Status   Specimen Description BLOOD RIGHT ANTECUBITAL   Final   Special Requests BOTTLES DRAWN AEROBIC AND ANAEROBIC 8CC   Final   Culture NO GROWTH 5 DAYS   Final   Report Status 07/15/2012 FINAL   Final  CULTURE, BLOOD (ROUTINE X 2)     Status: None   Collection Time    07/10/12  4:09 AM      Result Value Range Status   Specimen Description BLOOD RIGHT HAND   Final   Special Requests BOTTLES DRAWN AEROBIC AND ANAEROBIC 7CC   Final   Culture NO GROWTH 5 DAYS   Final   Report Status 07/15/2012 FINAL   Final  MRSA PCR SCREENING     Status: Abnormal   Collection Time    07/10/12  7:08 AM      Result Value Range Status   MRSA by PCR POSITIVE (*) NEGATIVE Final   Comment:            The GeneXpert MRSA Assay (FDA     approved for NASAL specimens     only), is one component of a     comprehensive MRSA colonization     surveillance program. It is not     intended to diagnose MRSA     infection nor to guide or     monitor treatment for     MRSA infections.     CRITICAL RESULT CALLED TO, READ BACK BY AND VERIFIED WITH:     GORDON, A. AT 1610RU ON 07/10/12 BY PRUITT, C.  CULTURE, BLOOD (ROUTINE X 2)     Status: None   Collection Time    07/12/12  1:15 AM      Result Value Range Status   Specimen Description BLOOD LEFT ARM   Final   Special Requests BOTTLES DRAWN AEROBIC ONLY 10CC   Final   Culture  Setup Time 07/12/2012 10:02   Final   Culture     Final   Value:        BLOOD CULTURE RECEIVED NO GROWTH TO DATE CULTURE WILL BE HELD FOR 5 DAYS BEFORE ISSUING A FINAL NEGATIVE REPORT   Report Status PENDING   Incomplete  CULTURE, BLOOD (ROUTINE X 2)     Status: None   Collection Time    07/12/12  1:27 AM      Result Value Range Status   Specimen Description BLOOD RIGHT ARM   Final   Special Requests BOTTLES DRAWN AEROBIC ONLY 10CC   Final   Culture  Setup Time 07/12/2012 10:00   Final   Culture  Final   Value:        BLOOD CULTURE RECEIVED NO GROWTH TO DATE CULTURE WILL BE HELD FOR 5 DAYS BEFORE ISSUING A FINAL NEGATIVE REPORT   Report Status PENDING   Incomplete  URINE CULTURE     Status: None   Collection Time    07/12/12  4:07 AM      Result Value Range Status   Specimen Description URINE, CLEAN CATCH   Final   Special Requests merem, vanc   Final   Culture  Setup Time 07/12/2012 04:19   Final   Colony Count NO GROWTH   Final   Culture NO GROWTH   Final   Report Status 07/13/2012 FINAL   Final  AFB CULTURE WITH SMEAR     Status: None   Collection Time    07/12/12 11:30 AM      Result Value Range Status   Specimen Description PLEURAL FLUID RIGHT   Final   Special Requests FLUID   Final   ACID FAST SMEAR NO ACID FAST BACILLI SEEN   Final   Culture     Final   Value: CULTURE WILL BE EXAMINED FOR 6 WEEKS BEFORE ISSUING A FINAL REPORT   Report Status PENDING   Incomplete  BODY FLUID CULTURE     Status: None   Collection Time    07/12/12 11:30 AM      Result Value Range Status   Specimen Description PLEURAL FLUID RIGHT   Final   Special Requests FLUID   Final   Gram Stain     Final   Value: RARE WBC PRESENT, PREDOMINANTLY PMN     NO ORGANISMS SEEN   Culture NO GROWTH 3 DAYS   Final   Report Status 07/16/2012 FINAL   Final     Studies: Dg Chest 2 View  07/17/2012   *RADIOLOGY REPORT*  Clinical Data: Pneumonia  CHEST - 2 VIEW  Comparison: 07/15/2012  Findings: Right pleural effusion and basilar consolidation.  Patchy airspace disease at the left base.  There is been no significant change.  No pneumothorax.  Borderline cardiomegaly.  IMPRESSION: Stable bilateral airspace disease and right pleural effusion.   Original Report Authenticated By: Jolaine Click, M.D.   Dg Chest Right Decubitus  07/17/2012   *RADIOLOGY REPORT*  Clinical Data: Evaluate pleural effusion  CHEST - RIGHT DECUBITUS  Comparison: Chest radiograph 07/17/2012 and 07/15/2012  Findings: There is a  layering right pleural effusion.  A right basilar opacity could be due to atelectasis or airspace disease. No focal opacities in the left lung on this decubitus view.  IMPRESSION: Layering right pleural effusion.  Probable right basilar airspace disease or atelectasis.   Original Report Authenticated By: Britta Mccreedy, M.D.    Scheduled Meds: . ampicillin-sulbactam (UNASYN) IV  3 g Intravenous Q6H  . enoxaparin (LOVENOX) injection  40 mg Subcutaneous Q24H  . ferrous sulfate  325 mg Oral TID WC  . guaiFENesin  1,200 mg Oral BID  . lidocaine  1 patch Transdermal Q24H  . LORazepam  1 mg Oral QHS  . pantoprazole  40 mg Oral BID  . polyethylene glycol  17 g Oral BID  . vancomycin  1,500 mg Intravenous Q8H   Continuous Infusions: . sodium chloride 10 mL/hr at 07/13/12 1900    Principal Problem:   Community acquired pneumonia Active Problems:   Heroin dependence   Chest pain   Polysubstance abuse   Depression, major   Drug addiction syndrome  Parapneumonic effusion    Time spent: 35 minutes    Rein Popov  [facility] Pager [phone]. If 7PM-7AM, please contact night-coverage at [email or web address], password [facility] 07/17/2012, 5:01 PM  LOS: 8 days

## 2012-07-18 LAB — CULTURE, BLOOD (ROUTINE X 2): Culture: NO GROWTH

## 2012-07-18 MED ORDER — KETOROLAC TROMETHAMINE 30 MG/ML IJ SOLN: 30.0000 mg | Freq: Once | INTRAMUSCULAR | Status: DC

## 2012-07-18 MED ORDER — LEVOFLOXACIN 750 MG PO TABS
750.0000 mg | ORAL_TABLET | Freq: Every day | ORAL | Status: DC
  Administered 2012-07-18 – 2012-07-19 (×2): 750 mg via ORAL
  Filled 2012-07-18 (×2): qty 1

## 2012-07-18 MED ORDER — TRAMADOL HCL 50 MG PO TABS
50.0000 mg | ORAL_TABLET | Freq: Three times a day (TID) | ORAL | Status: DC | PRN
  Administered 2012-07-18: 50 mg via ORAL
  Filled 2012-07-18: qty 1

## 2012-07-18 NOTE — Evaluation (Signed)
Physical Therapy Evaluation Patient Details Name: Danielle Chandler MRN: [id number] DOB: 12-May-1985 Today's Date: 07/18/2012 Time: 7893-8101 PT Time Calculation (min): 20 min  PT Assessment / Plan / Recommendation Clinical Impression  Pt is a 27 y.o. female with ARF in setting of PNA. Patient demonstrates deficits in functional mobility secondary to pain. Anticipate patient will progress well as pain decreased. Rec d/c home with family to assist.    PT Assessment  Patient needs continued PT services    Follow Up Recommendations  No PT follow up       Barriers to Discharge Inaccessible home environment      Equipment Recommendations  None recommended by PT    Recommendations for Other Services OT consult   Frequency Min 3X/week    Precautions / Restrictions Restrictions Weight Bearing Restrictions: No   Pertinent Vitals/Pain 8/10      Mobility  Bed Mobility Bed Mobility: Supine to Sit;Sitting - Scoot to Edge of Bed Supine to Sit: 5: Supervision Sitting - Scoot to Edge of Bed: 5: Supervision Details for Bed Mobility Assistance: Increased time needed, increased pain with mobility Transfers Transfers: Sit to Stand;Stand to Sit Sit to Stand: 6: Modified independent (Device/Increase time);From bed;From toilet Stand to Sit: 6: Modified independent (Device/Increase time);To chair/3-in-1;To toilet Details for Transfer Assistance: Increased time to perform secondary to pain Ambulation/Gait Ambulation/Gait Assistance: 5: Supervision Ambulation Distance (Feet): 80 Feet Assistive device: Other (Comment) (pushing IV pole) Ambulation/Gait Assistance Details: very slow ambulation but patient steady on her feet Gait Pattern: Step-through pattern;Decreased stride length;Narrow base of support Gait velocity: decreased General Gait Details: very rigid and guarded during ambulation secondary to pain    Exercises     PT Diagnosis: Difficulty walking;Acute pain  PT Problem List:  Decreased strength;Decreased range of motion;Decreased activity tolerance;Pain PT Treatment Interventions: DME instruction;Gait training;Stair training;Functional mobility training;Therapeutic activities;Therapeutic exercise;Balance training;Patient/family education   PT Goals Acute Rehab PT Goals PT Goal Formulation: With patient Time For Goal Achievement: 08/01/12 Potential to Achieve Goals: Good Pt will Ambulate: >150 feet;Independently PT Goal: Ambulate - Progress: Goal set today Pt will Go Up / Down Stairs: Flight;with min assist PT Goal: Up/Down Stairs - Progress: Goal set today  Visit Information  Last PT Received On: 07/18/12 Assistance Needed: +1    Subjective Data  Subjective: I would like to take a bath Patient Stated Goal: to go home   Prior Functioning  Home Living Lives With: Family Available Help at Discharge: Family Type of Home: Apartment Home Access: Stairs to enter Secretary/administrator of Steps: 12 Entrance Stairs-Rails: Right;Left;Can reach both Home Layout: One level Bathroom Shower/Tub: Network engineer: None Prior Function Level of Independence: Independent Able to Take Stairs?: Yes Driving: Yes Dominant Hand: Right    Cognition  Cognition Arousal/Alertness: Awake/alert Behavior During Therapy: WFL for tasks assessed/performed Overall Cognitive Status: Within Functional Limits for tasks assessed    Extremity/Trunk Assessment Right Upper Extremity Assessment RUE ROM/Strength/Tone: Deficits;Unable to fully assess;Due to pain Left Upper Extremity Assessment LUE ROM/Strength/Tone: Deficits;Unable to fully assess;Due to pain Right Lower Extremity Assessment RLE ROM/Strength/Tone: [facility] for tasks assessed Left Lower Extremity Assessment LLE ROM/Strength/Tone: [facility] for tasks assessed   Balance Balance Balance Assessed: Yes High Level Balance High Level Balance Activites: Side stepping;Backward  walking;Direction changes;Turns;Head turns High Level Balance Comments: steady but slow  End of Session PT - End of Session Equipment Utilized During Treatment: Gait belt Activity Tolerance: Patient limited by pain Patient left: in chair;with call bell/phone  within reach Nurse Communication: Mobility status;Patient requests pain meds (pt would like assist to clean areas she can not reach)  GP     Fabio Asa 07/18/2012, 3:33 PM  Charlotte Crumb, PT DPT  [phone]

## 2012-07-18 NOTE — Progress Notes (Signed)
Patient refused to ambulate this evening due to pain. Will continue to monitor.

## 2012-07-18 NOTE — Progress Notes (Signed)
PULMONARY  / CRITICAL CARE MEDICINE  Name: Danielle Chandler MRN: [id number] DOB: January 14, 1986    ADMISSION DATE:  07/09/2012 CONSULTATION DATE:  07/11/12  REFERRING MD :  Jeani Hawking PRIMARY SERVICE: PCCM   CHIEF COMPLAINT:  Acute respiratory failure   BRIEF PATIENT DESCRIPTION:  27 y/o female with hx IV drug use admitted initially to [facility] with CAP.  On 5/12 tx to [facility] for worsening dyspnea, large pleural effusion and questionable ARDS.  Overnight developed worsening dyspnea, increasing hypoxia requiring NRB and developed large R pleural effusion and was tx to [facility] for further management and likely thoracentesis.   SIGNIFICANT EVENTS / STUDIES:  5/11 - HIV>>> NR 5/11 - CTA chest>> Neg for PE, R>L asd, small R effusion  5/13 - 350 pleural fluid - neutrophilic exudate 1/19 - CT Chest>>>multilobar PNA, ? R perihilar soft tissue mass, minimal loculated R effusion, medial pleural thickening  LINES / TUBES: none  CULTURES: BCx2 5/11>>>ng Sputum 5/12>>>ng Pleural Fluid 5/13>>>no AFB Pleural Fluid culture 5/13>>>neg UC 5/13>>>neg  ANTIBIOTICS: Azithro 5/11>>>514 Merropenem 5/11>>>5/14 Vanc 5/11>>> Unasyn 5/14>>  VITAL SIGNS: Temp:  [98.2 F (36.8 C)-98.7 F (37.1 C)] 98.2 F (36.8 C) (05/19 0620) Pulse Rate:  [80-102] 94 (05/19 0620) Resp:  [18] 18 (05/19 0620) BP: (110-124)/(67-78) 122/78 mmHg (05/19 0620) SpO2:  [97 %-100 %] 100 % (05/19 0620)  INTAKE / OUTPUT: Intake/Output     05/18 0701 - 05/19 0700 05/19 0701 - 05/20 0700   P.O. 360 120   IV Piggyback     Total Intake(mL/kg) 360 (3.8) 120 (1.3)   Urine (mL/kg/hr) 3775 (1.6)    Total Output 3775     Net -3415 +120          SUBJECTIVE:   C/o pain  PHYSICAL EXAMINATION: General:  Young female, appears uncomfortable, NAD Neuro:  Awake, alert, answers questions appropriately, MAE  HEENT:  Mm dry, no jVD Cardiovascular:  s1s2 rrr Lungs:  resps even, non labored on [place], R sided crackles  Abdomen:  Soft,  +bs Ext: warm and dry, no edema   LABS:  Recent Labs Lab 07/11/12 1510 07/12/12 0345  07/12/12 0350 07/13/12 0355  07/15/12 0415 07/16/12 0350 07/17/12 0510  HGB  --   --   < > 9.1* 8.6*  < > 8.6* 8.8* 9.3*  WBC  --   --   < > 9.8 9.5  < > 11.0* 10.5 9.7  PLT  --   --   < > 283 317  < > 387 463* 510*  NA  --   --   < > 138 136  < > 135 136 135  K  --   --   < > 3.8 3.5  < > 3.8 3.8 4.0  CL  --   --   < > 106 102  < > 100 99 100  CO2  --   --   < > 25 26  < > 24 28 27   GLUCOSE  --   --   < > 99 99  < > 95 89 98  BUN  --   --   < > 9 9  < > 9 6 8   CREATININE  --   --   < > 0.69 0.59  < > 0.71 0.63 0.61  CALCIUM  --   --   < > 8.6 8.4  < > 8.5 9.2 9.4  AST  --   --   --  14  --   --   --   --   --   ALT  --   --   --  21  --   --   --   --   --   ALKPHOS  --   --   --  131*  --   --   --   --   --   BILITOT  --   --   --  0.3  --   --   --   --   --   PROT  --   --   --  6.3  6.2  --   --   --   --   --   ALBUMIN  --   --   --  2.2*  --   --   --   --   --   INR 0.99  --   --   --   --   --   --   --   --   LATICACIDVEN 0.8  --   --   --   --   --   --   --   --   PROCALCITON <0.10 <0.10  --   --  <0.10  --   --   --   --   < > = values in this interval not displayed. No results found for this basename: GLUCAP,  in the last 168 hours  CXR:  Dg Chest 2 View  07/17/2012   *RADIOLOGY REPORT*  Clinical Data: Pneumonia  CHEST - 2 VIEW  Comparison: 07/15/2012  Findings: Right pleural effusion and basilar consolidation.  Patchy airspace disease at the left base.  There is been no significant change.  No pneumothorax.  Borderline cardiomegaly.  IMPRESSION: Stable bilateral airspace disease and right pleural effusion.   Original Report Authenticated By: Jolaine Click, M.D.   US Transvaginal Non-ob  07/17/2012   *RADIOLOGY REPORT*  Clinical Data: Indeterminate left adnexal cystic lesion seen on recent CT.  Abdominal pain.  TRANSABDOMINAL AND TRANSVAGINAL ULTRASOUND OF PELVIS  Technique:   Both transabdominal and transvaginal ultrasound examinations of the pelvis were performed.  Transabdominal technique was performed for global imaging of the pelvis including uterus, ovaries, adnexal regions, and pelvic cul-de-sac.  It was necessary to proceed with endovaginal exam following the transabdominal exam to visualize the endometrium and cystic lesion and left adnexa.  Comparison:  CT on 07/15/2012  Findings: Uterus:  9.0 x 4.5 x 5.5 cm.  No fibroids or other uterine mass identified.  Endometrium: Endometrial thickness measures 10 mm transvaginally. No focal lesion visualized.  Right ovary: 2.9 x 1.7 x 2.6 cm.  Normal appearance.  Left ovary: A complex cystic lesion is seen in the left adnexa which measures 5.5 x 3.7 x 4.6 cm.  This lesion shows wall thickening as well as a mural soft tissue nodule measuring approximately 1.7 cm.  It is uncertain whether this nodular area contains internal blood flow or not as color Doppler ultrasound was not applied to this area.  This could represent blood clot, although a solid mural soft tissue nodule due to a cystic ovarian neoplasm cannot be excluded.  Other Findings:  No free fluid  IMPRESSION:  1.  5.5 cm complex cystic lesion in left adnexa which contains a mural nodule.  Cystic ovarian neoplasm cannot be excluded. Management options include follow-up by ultrasound in 6-12 weeks, or pelvic MRI without with contrast for further characterization. 2.  Normal appearance of uterus and right ovary.   Original Report Authenticated By: Myles Rosenthal, M.D.   US Pelvis Complete  07/17/2012   *RADIOLOGY REPORT*  Clinical Data: Indeterminate left adnexal cystic lesion seen on recent CT.  Abdominal pain.  TRANSABDOMINAL AND TRANSVAGINAL ULTRASOUND OF PELVIS  Technique:  Both transabdominal and transvaginal ultrasound examinations of the pelvis were performed.  Transabdominal technique was performed for global imaging of the pelvis including uterus, ovaries, adnexal regions, and  pelvic cul-de-sac.  It was necessary to proceed with endovaginal exam following the transabdominal exam to visualize the endometrium and cystic lesion and left adnexa.  Comparison:  CT on 07/15/2012  Findings: Uterus:  9.0 x 4.5 x 5.5 cm.  No fibroids or other uterine mass identified.  Endometrium: Endometrial thickness measures 10 mm transvaginally. No focal lesion visualized.  Right ovary: 2.9 x 1.7 x 2.6 cm.  Normal appearance.  Left ovary: A complex cystic lesion is seen in the left adnexa which measures 5.5 x 3.7 x 4.6 cm.  This lesion shows wall thickening as well as a mural soft tissue nodule measuring approximately 1.7 cm.  It is uncertain whether this nodular area contains internal blood flow or not as color Doppler ultrasound was not applied to this area.  This could represent blood clot, although a solid mural soft tissue nodule due to a cystic ovarian neoplasm cannot be excluded.  Other Findings:  No free fluid  IMPRESSION:  1.  5.5 cm complex cystic lesion in left adnexa which contains a mural nodule.  Cystic ovarian neoplasm cannot be excluded. Management options include follow-up by ultrasound in 6-12 weeks, or pelvic MRI without with contrast for further characterization. 2.  Normal appearance of uterus and right ovary.   Original Report Authenticated By: Myles Rosenthal, M.D.   Dg Chest Right Decubitus  07/17/2012   *RADIOLOGY REPORT*  Clinical Data: Evaluate pleural effusion  CHEST - RIGHT DECUBITUS  Comparison: Chest radiograph 07/17/2012 and 07/15/2012  Findings: There is a layering right pleural effusion.  A right basilar opacity could be due to atelectasis or airspace disease. No focal opacities in the left lung on this decubitus view.  IMPRESSION: Layering right pleural effusion.  Probable right basilar airspace disease or atelectasis.   Original Report Authenticated By: Britta Mccreedy, M.D.     ASSESSMENT / PLAN:    Acute hypoxic respiratory failure - improving, decreased FiO2 demands.   CTA neg for PE.  CAP - Appears to be mostly consolidation rather than fluid.  Repeat CT 5/14 with multi-lobar PNA.  TTE negative for vegetation R pleural effusion - 350 pleural fluid - neutrophilic exudate (not empyema), culture neg however areas of pleural thickening & concern for possible development of empyema on CT 5/14.  Improving clinically.   P:   Cont O2 and wean as able  Cont current abx, rec 14 days abx, has had 7 days IV, consider change to PO Aggressive pulmonary hygiene PRN BD  Would rec-- change to PO levaquin to complete total 14 days abx for complicated CAP. F/u CT chest in 1 month to eval effusions.  If CT scan (or if any interval clinical change) suggests complicated R pleural space then would need TCTS consult for possible VATS.  Don't see indication for this at this time as she is progressing clinically. If she worsens or if further concern, could review the CT scan (from 5/14) and subsequent CXR's with TCTS to see if they agree with this plan.   Anemia - mild  P:  Monitor CBC  Hx IV drug use, polysubstance abuse, mult behavior health admits  Back pain/pleuritic chest pain  P:   Pain rx per [facility], avoid oversedation  Recommend NA as outpatient and counseling   WHITEHEART,KATHRYN, NP 07/18/2012  11:07 AM Pager: [phone] or [phone]  *Care during the described time interval was provided by me and/or other providers on the critical care team. I have reviewed this patient's available data, including medical history, events of note, physical examination and test results as part of my evaluation.  Please call if we can help in any way  Levy Pupa, MD, PhD 07/18/2012, 11:30 AM [facility] [phone] or if no answer [phone]

## 2012-07-18 NOTE — Progress Notes (Signed)
[facility] PROGRESS NOTE  Danielle Chandler GNF:[id number] DOB: [birth year] DOA: 07/09/2012 PCP: No PCP Per Patient  Assessment/Plan: 1-PNA, right side pleural effusion: per pulmonary more consolidation than pleural fluid. TTE negative for vegetation. No empyema. Cytology negative for malignancy, Pleural fluid no growth to date. ? developing empyema on CT 5-14. Repeat chest x ray 5-18 stable bilateral air space diseases. WBC trending down. Received  Unasyn for 5 days  and Vancomycin for 8 days. Appreciate pulmonary recommendation. CT chest in 1 month. Will change antibiotics to Levaquin.  2-Acute hypoxic respiratory failure - Secondary to PNA. improving. CTA neg for PE. 3-Anemia, iron deficiency: Continue with ferrous sulfate. Iron 21, ferritin 187. 4-Hx IV drug use, polysubstance abuse, mult behavior health admits. Social work consult out patient rehab options.   5-Back pain/pleuritic chest pain, epigastric pain: stop Toradol to avoid gastritis. CT abdomen negative, incidental left annexal space cyst.  Continue with protonix. Pain better. miralax for bowel regimen. DC IV dilaudid.  6- Left Adnexa Cyst 5.5 cm. Complex cyst by [place]. Patient advised to follow up with her Gynecologist. She will need repeat US or MRI. Will defer to her Gynecologist if ordering MRI. Patient has a history of ovarian cyst.    SIGNIFICANT EVENTS / STUDIES:  5/11 - HIV>>> NR  5/11 - CTA chest>> Neg for PE, R>L asd, small R effusion  5/13 - 350 pleural fluid - neutrophilic exudate  5/14 - CT Chest>>>multilobar PNA, ? R perihilar soft tissue mass, minimal loculated R effusion, medial pleural thickening  Code Status: Full Family Communication: care discussed with patient.  Disposition Plan: PT, OT consult, Oxygen sat on ambulation. Plan for discharge 5-20   Consultants:  CCM sign off  Procedures: Right side thoracentesis.  CULTURES:  BCx2 5/11>>>ng  Sputum 5/12>>>ng  Pleural Fluid 5/13>>>no AFB  Pleural Fluid  culture 5/13>>>  UC 5/13>>>neg   Antibiotics: Vancomycin 5-11--- 5-19 Unasyn 5- 14----5-19 Azithro 5/11>>>514  Merropenem 5/11>>>5/14   HPI/Subjective: Feeling better. Still with chest pain.   Objective: Filed Vitals:   07/17/12 1402 07/17/12 1925 07/18/12 0620 07/18/12 1345  BP: 124/67 110/73 122/78 120/83  Pulse: 80 102 94 96  Temp: 98.6 F (37 C) 98.7 F (37.1 C) 98.2 F (36.8 C) 98.3 F (36.8 C)  TempSrc: Oral Oral Oral Oral  Resp: 18 18 18 18   Height:      Weight:      SpO2: 98% 97% 100% 96%    Intake/Output Summary (Last 24 hours) at 07/18/12 1359 Last data filed at 07/18/12 1300  Gross per 24 hour  Intake    720 ml  Output   4176 ml  Net  -3456 ml   Filed Weights   07/10/12 0640 07/11/12 0500 07/11/12 1000  Weight: 91.7 kg (202 lb 2.6 oz) 96.6 kg (212 lb 15.4 oz) 95.6 kg (210 lb 12.2 oz)    Exam:   General: in no acute distress.   Cardiovascular: S1 S 2,  RRR  Respiratory: crackles right side  Abdomen: BS present, soft, epigastric tenderness.   Musculoskeletal: trace edema.   Data Reviewed: Basic Metabolic Panel:  Recent Labs Lab 07/13/12 0355 07/14/12 0530 07/15/12 0415 07/16/12 0350 07/17/12 0510  NA 136 136 135 136 135  K 3.5 3.7 3.8 3.8 4.0  CL 102 102 100 99 100  CO2 26 23 24 28 27   GLUCOSE 99 96 95 89 98  BUN 9 9 9 6 8   CREATININE 0.59 0.54 0.71 0.63 0.61  CALCIUM 8.4 8.6  8.5 9.2 9.4   Liver Function Tests:  Recent Labs Lab 07/12/12 0350  AST 14  ALT 21  ALKPHOS 131*  BILITOT 0.3  PROT 6.3  6.2  ALBUMIN 2.2*   No results found for this basename: LIPASE, AMYLASE,  in the last 168 hours No results found for this basename: AMMONIA,  in the last 168 hours CBC:  Recent Labs Lab 07/13/12 0355 07/14/12 0530 07/15/12 0415 07/16/12 0350 07/17/12 0510  WBC 9.5 9.1 11.0* 10.5 9.7  HGB 8.6* 8.5* 8.6* 8.8* 9.3*  HCT 26.5* 25.9* 26.4* 26.7* 28.2*  MCV 84.9 85.5 84.9 84.0 84.7  PLT 317 342 387 463* 510*   Cardiac  Enzymes: No results found for this basename: CKTOTAL, CKMB, CKMBINDEX, TROPONINI,  in the last 168 hours BNP (last 3 results) No results found for this basename: PROBNP,  in the last 8760 hours CBG: No results found for this basename: GLUCAP,  in the last 168 hours  Recent Results (from the past 240 hour(s))  CULTURE, BLOOD (ROUTINE X 2)     Status: None   Collection Time    07/10/12  4:04 AM      Result Value Range Status   Specimen Description BLOOD RIGHT ANTECUBITAL   Final   Special Requests BOTTLES DRAWN AEROBIC AND ANAEROBIC 8CC   Final   Culture NO GROWTH 5 DAYS   Final   Report Status 07/15/2012 FINAL   Final  CULTURE, BLOOD (ROUTINE X 2)     Status: None   Collection Time    07/10/12  4:09 AM      Result Value Range Status   Specimen Description BLOOD RIGHT HAND   Final   Special Requests BOTTLES DRAWN AEROBIC AND ANAEROBIC 7CC   Final   Culture NO GROWTH 5 DAYS   Final   Report Status 07/15/2012 FINAL   Final  MRSA PCR SCREENING     Status: Abnormal   Collection Time    07/10/12  7:08 AM      Result Value Range Status   MRSA by PCR POSITIVE (*) NEGATIVE Final   Comment:            The GeneXpert MRSA Assay (FDA     approved for NASAL specimens     only), is one component of a     comprehensive MRSA colonization     surveillance program. It is not     intended to diagnose MRSA     infection nor to guide or     monitor treatment for     MRSA infections.     CRITICAL RESULT CALLED TO, READ BACK BY AND VERIFIED WITH:     GORDON, A. AT [id number] ON 07/10/12 BY PRUITT, C.  CULTURE, BLOOD (ROUTINE X 2)     Status: None   Collection Time    07/12/12  1:15 AM      Result Value Range Status   Specimen Description BLOOD LEFT ARM   Final   Special Requests BOTTLES DRAWN AEROBIC ONLY 10CC   Final   Culture  Setup Time 07/12/2012 10:02   Final   Culture NO GROWTH 5 DAYS   Final   Report Status 07/18/2012 FINAL   Final  CULTURE, BLOOD (ROUTINE X 2)     Status: None    Collection Time    07/12/12  1:27 AM      Result Value Range Status   Specimen Description BLOOD RIGHT ARM   Final   Special  Requests BOTTLES DRAWN AEROBIC ONLY 10CC   Final   Culture  Setup Time 07/12/2012 10:00   Final   Culture NO GROWTH 5 DAYS   Final   Report Status 07/18/2012 FINAL   Final  URINE CULTURE     Status: None   Collection Time    07/12/12  4:07 AM      Result Value Range Status   Specimen Description URINE, CLEAN CATCH   Final   Special Requests merem, vanc   Final   Culture  Setup Time 07/12/2012 04:19   Final   Colony Count NO GROWTH   Final   Culture NO GROWTH   Final   Report Status 07/13/2012 FINAL   Final  AFB CULTURE WITH SMEAR     Status: None   Collection Time    07/12/12 11:30 AM      Result Value Range Status   Specimen Description PLEURAL FLUID RIGHT   Final   Special Requests FLUID   Final   ACID FAST SMEAR NO ACID FAST BACILLI SEEN   Final   Culture     Final   Value: CULTURE WILL BE EXAMINED FOR 6 WEEKS BEFORE ISSUING A FINAL REPORT   Report Status PENDING   Incomplete  BODY FLUID CULTURE     Status: None   Collection Time    07/12/12 11:30 AM      Result Value Range Status   Specimen Description PLEURAL FLUID RIGHT   Final   Special Requests FLUID   Final   Gram Stain     Final   Value: RARE WBC PRESENT, PREDOMINANTLY PMN     NO ORGANISMS SEEN   Culture NO GROWTH 3 DAYS   Final   Report Status 07/16/2012 FINAL   Final     Studies: Dg Chest 2 View  07/17/2012   *RADIOLOGY REPORT*  Clinical Data: Pneumonia  CHEST - 2 VIEW  Comparison: 07/15/2012  Findings: Right pleural effusion and basilar consolidation.  Patchy airspace disease at the left base.  There is been no significant change.  No pneumothorax.  Borderline cardiomegaly.  IMPRESSION: Stable bilateral airspace disease and right pleural effusion.   Original Report Authenticated By: Jolaine Click, M.D.   US Transvaginal Non-ob  07/17/2012   *RADIOLOGY REPORT*  Clinical Data:  Indeterminate left adnexal cystic lesion seen on recent CT.  Abdominal pain.  TRANSABDOMINAL AND TRANSVAGINAL ULTRASOUND OF PELVIS  Technique:  Both transabdominal and transvaginal ultrasound examinations of the pelvis were performed.  Transabdominal technique was performed for global imaging of the pelvis including uterus, ovaries, adnexal regions, and pelvic cul-de-sac.  It was necessary to proceed with endovaginal exam following the transabdominal exam to visualize the endometrium and cystic lesion and left adnexa.  Comparison:  CT on 07/15/2012  Findings: Uterus:  9.0 x 4.5 x 5.5 cm.  No fibroids or other uterine mass identified.  Endometrium: Endometrial thickness measures 10 mm transvaginally. No focal lesion visualized.  Right ovary: 2.9 x 1.7 x 2.6 cm.  Normal appearance.  Left ovary: A complex cystic lesion is seen in the left adnexa which measures 5.5 x 3.7 x 4.6 cm.  This lesion shows wall thickening as well as a mural soft tissue nodule measuring approximately 1.7 cm.  It is uncertain whether this nodular area contains internal blood flow or not as color Doppler ultrasound was not applied to this area.  This could represent blood clot, although a solid mural soft tissue nodule due to a  cystic ovarian neoplasm cannot be excluded.  Other Findings:  No free fluid  IMPRESSION:  1.  5.5 cm complex cystic lesion in left adnexa which contains a mural nodule.  Cystic ovarian neoplasm cannot be excluded. Management options include follow-up by ultrasound in 6-12 weeks, or pelvic MRI without with contrast for further characterization. 2.  Normal appearance of uterus and right ovary.   Original Report Authenticated By: Myles Rosenthal, M.D.   US Pelvis Complete  07/17/2012   *RADIOLOGY REPORT*  Clinical Data: Indeterminate left adnexal cystic lesion seen on recent CT.  Abdominal pain.  TRANSABDOMINAL AND TRANSVAGINAL ULTRASOUND OF PELVIS  Technique:  Both transabdominal and transvaginal ultrasound examinations of the  pelvis were performed.  Transabdominal technique was performed for global imaging of the pelvis including uterus, ovaries, adnexal regions, and pelvic cul-de-sac.  It was necessary to proceed with endovaginal exam following the transabdominal exam to visualize the endometrium and cystic lesion and left adnexa.  Comparison:  CT on 07/15/2012  Findings: Uterus:  9.0 x 4.5 x 5.5 cm.  No fibroids or other uterine mass identified.  Endometrium: Endometrial thickness measures 10 mm transvaginally. No focal lesion visualized.  Right ovary: 2.9 x 1.7 x 2.6 cm.  Normal appearance.  Left ovary: A complex cystic lesion is seen in the left adnexa which measures 5.5 x 3.7 x 4.6 cm.  This lesion shows wall thickening as well as a mural soft tissue nodule measuring approximately 1.7 cm.  It is uncertain whether this nodular area contains internal blood flow or not as color Doppler ultrasound was not applied to this area.  This could represent blood clot, although a solid mural soft tissue nodule due to a cystic ovarian neoplasm cannot be excluded.  Other Findings:  No free fluid  IMPRESSION:  1.  5.5 cm complex cystic lesion in left adnexa which contains a mural nodule.  Cystic ovarian neoplasm cannot be excluded. Management options include follow-up by ultrasound in 6-12 weeks, or pelvic MRI without with contrast for further characterization. 2.  Normal appearance of uterus and right ovary.   Original Report Authenticated By: Myles Rosenthal, M.D.   Dg Chest Right Decubitus  07/17/2012   *RADIOLOGY REPORT*  Clinical Data: Evaluate pleural effusion  CHEST - RIGHT DECUBITUS  Comparison: Chest radiograph 07/17/2012 and 07/15/2012  Findings: There is a layering right pleural effusion.  A right basilar opacity could be due to atelectasis or airspace disease. No focal opacities in the left lung on this decubitus view.  IMPRESSION: Layering right pleural effusion.  Probable right basilar airspace disease or atelectasis.   Original Report  Authenticated By: Britta Mccreedy, M.D.    Scheduled Meds: . enoxaparin (LOVENOX) injection  40 mg Subcutaneous Q24H  . ferrous sulfate  325 mg Oral TID WC  . guaiFENesin  1,200 mg Oral BID  . levofloxacin  750 mg Oral Daily  . lidocaine  1 patch Transdermal Q24H  . LORazepam  1 mg Oral QHS  . pantoprazole  40 mg Oral BID  . polyethylene glycol  17 g Oral BID  . vancomycin  1,500 mg Intravenous Q8H   Continuous Infusions: . sodium chloride 10 mL/hr at 07/13/12 1900    Principal Problem:   Community acquired pneumonia Active Problems:   Heroin dependence   Chest pain   Polysubstance abuse   Depression, major   Drug addiction syndrome   Parapneumonic effusion    Time spent: 25 minutes    Danielle Chandler  [facility] Pager [phone]. If 7PM-7AM,  please contact night-coverage at [email or web address], password [facility] 07/18/2012, 1:59 PM  LOS: 9 days

## 2012-07-19 LAB — CBC
Hemoglobin: 10.2 g/dL — ABNORMAL LOW (ref 12.0–15.0)
MCHC: 32.7 g/dL (ref 30.0–36.0)
Platelets: 650 10*3/uL — ABNORMAL HIGH (ref 150–400)
RDW: 15.4 % (ref 11.5–15.5)

## 2012-07-19 LAB — BASIC METABOLIC PANEL
BUN: 12 mg/dL (ref 6–23)
GFR calc Af Amer: >90 mL/min (ref >90)
GFR calc non Af Amer: >90 mL/min (ref >90)
Potassium: 4.2 mEq/L (ref 3.5–5.1)

## 2012-07-19 MED ORDER — POLYETHYLENE GLYCOL 3350 17 G PO PACK: 17.0000 g | PACK | Freq: Two times a day (BID) | ORAL | Status: DC

## 2012-07-19 MED ORDER — GUAIFENESIN ER 600 MG PO TB12: 1200.0000 mg | ORAL_TABLET | Freq: Two times a day (BID) | ORAL | Status: DC

## 2012-07-19 MED ORDER — FERROUS SULFATE 325 (65 FE) MG PO TABS: 325.0000 mg | ORAL_TABLET | Freq: Three times a day (TID) | ORAL | Status: DC

## 2012-07-19 MED ORDER — LEVOFLOXACIN 750 MG PO TABS: 750.0000 mg | ORAL_TABLET | Freq: Every day | ORAL | Status: DC

## 2012-07-19 MED ORDER — PANTOPRAZOLE SODIUM 40 MG PO TBEC: 40.0000 mg | DELAYED_RELEASE_TABLET | Freq: Every day | ORAL | Status: DC

## 2012-07-19 MED ORDER — HYDROMORPHONE HCL 4 MG PO TABS: 4.0000 mg | ORAL_TABLET | ORAL | Status: DC | PRN

## 2012-07-19 NOTE — Discharge Summary (Signed)
Physician Discharge Summary  JOURNII NIERMAN RUE:[id number] DOB: 1985-06-24 DOA: 07/09/2012  PCP: No PCP Per Patient  Admit date: 07/09/2012 Discharge date: 07/19/2012  Time spent: 35 minutes  Recommendations for Outpatient Follow-up:  1. Need a cbc in 3 days.  2. Patient advised to return to hospital if she develop fever or change in medical condition.   Discharge Diagnoses:    Community acquired pneumonia, pleural effusion   Heroin dependence   Chest pain   Polysubstance abuse   Depression, major   Drug addiction syndrome   Parapneumonic effusion   Discharge Condition: Stable.   Diet recommendation: regular diet  Filed Weights   07/10/12 0640 07/11/12 0500 07/11/12 1000  Weight: 91.7 kg (202 lb 2.6 oz) 96.6 kg (212 lb 15.4 oz) 95.6 kg (210 lb 12.2 oz)    History of present illness:  27yo female with hx IV drug use admitted initially to [facility] with CAP. On 5/12 tx to [facility] for worsening dyspnea, large pleural effusion and ?ARDS. Overnight developed worsening dyspnea, increasing hypoxia requiring NRB and developed large R pleural effusion and was tx to [facility] for further management and likely thoracentesis.    Hospital Course:  1-PNA, right side pleural effusion: per pulmonary more consolidation than pleural fluid. TTE negative for vegetation. No empyema. Cytology negative for malignancy, Pleural fluid no growth to date. ? developing empyema on CT 5-14. Repeat chest x ray 5-18 stable bilateral air space diseases. WBC was trending down. Received Unasyn for 5 days and Vancomycin for 8 days. Appreciate pulmonary recommendation. CT chest in 1 month.  Antibiotics change  to Levaquin 5-19. WBC increase slightly, patient afebrile, improving clinically. She does not want to stay in the hospital another day to repeat WBC. I explain to her risk and benefit. I advised her to return to hospital if she develop fever or any change on her medical condition. Will provide 7 days of Levaquin.   2-Acute hypoxic respiratory failure - Secondary to PNA. improving. CTA neg for PE.  3-Anemia, iron deficiency: Continue with ferrous sulfate. Iron 21, ferritin 187.  4-Hx IV drug use, polysubstance abuse, mult behavior health admits. Social work consult out patient rehab options.  5-Back pain/pleuritic chest pain, epigastric pain: stop Toradol to avoid gastritis. CT abdomen negative, incidental left annexal space cyst. Continue with protonix. Pain better. miralax for bowel regimen. DC IV dilaudid.  6- Left Adnexa Cyst 5.5 cm. Complex cyst by [place]. Patient advised to follow up with her Gynecologist. She will need repeat US or MRI. Will defer to her Gynecologist if ordering MRI. Patient has a history of ovarian cyst.    Procedures: Right side thoracentesis.  CULTURES:  BCx2 5/11>>>ng  Sputum 5/12>>>ng  Pleural Fluid 5/13>>>no AFB  Pleural Fluid culture 5/13>>>  UC 5/13>>>neg  Antibiotics:  Vancomycin 5-11--- 5-19  Unasyn 5- 14----5-19  Azithro 5/11>>>514  Merropenem 5/11>>>5/14   Consultations:  Pulmonary  Discharge Exam: Filed Vitals:   07/18/12 0620 07/18/12 1345 07/18/12 2111 07/19/12 0635  BP: 122/78 120/83 106/74 104/62  Pulse: 94 96 99 84  Temp: 98.2 F (36.8 C) 98.3 F (36.8 C) 97.8 F (36.6 C) 98.5 F (36.9 C)  TempSrc: Oral Oral Oral Oral  Resp: 18 18 18 18   Height:      Weight:      SpO2: 100% 96% 96% 93%    General: No distress.  Cardiovascular: S 1, S 2 RRR Respiratory: Crackles bases.   Discharge Instructions  Discharge Orders   Future Orders  Complete By Expires     Diet general  As directed     Increase activity slowly  As directed         Medication List    STOP taking these medications       acetaminophen 500 MG tablet  Commonly known as:  TYLENOL     HYDROcodone-acetaminophen 5-325 MG per tablet  Commonly known as:  NORCO/VICODIN     sulfamethoxazole-trimethoprim 800-160 MG per tablet  Commonly known as:  BACTRIM DS     ZITHROMAX  Z-PAK 250 MG tablet  Generic drug:  azithromycin      TAKE these medications       ferrous sulfate 325 (65 FE) MG tablet  Take 1 tablet (325 mg total) by mouth 3 (three) times daily with meals.     guaiFENesin 600 MG 12 hr tablet  Commonly known as:  MUCINEX  Take 2 tablets (1,200 mg total) by mouth 2 (two) times daily.     HYDROmorphone 4 MG tablet  Commonly known as:  DILAUDID  Take 1 tablet (4 mg total) by mouth every 4 (four) hours as needed.     levofloxacin 750 MG tablet  Commonly known as:  LEVAQUIN  Take 1 tablet (750 mg total) by mouth daily.     ondansetron 4 MG disintegrating tablet  Commonly known as:  ZOFRAN-ODT  Take 4 mg by mouth 3 (three) times daily.     pantoprazole 40 MG tablet  Commonly known as:  PROTONIX  Take 1 tablet (40 mg total) by mouth daily.     polyethylene glycol packet  Commonly known as:  MIRALAX / GLYCOLAX  Take 17 g by mouth 2 (two) times daily.       Allergies  Allergen Reactions  . Nubain (Nalbuphine Hcl) Other (See Comments)    Patient said it made her hurt really badly, also burning sensation throughout body       Follow-up Information   Follow up with No PCP Per Patient In 5 days.   Contact information:   [address] [place] [address] [phone]       Follow up with Leslye Peer., MD In 3 weeks.   Contact information:   [address] [place] [address] [phone]        The results of significant diagnostics from this hospitalization (including imaging, microbiology, ancillary and laboratory) are listed below for reference.    Significant Diagnostic Studies: Dg Chest 1 View  07/12/2012   *RADIOLOGY REPORT*  Clinical Data: Status post right thoracentesis.  CHEST - 1 VIEW  Comparison: Plain film chest 07/12/2012 and CT chest 07/10/2012.  Findings: Large right pleural effusion seen on the prior study appears somewhat decreased.  Right much worse than left airspace disease persists with worsened  aeration seen in the left base. There is cardiomegaly.  IMPRESSION:  1.  Some decrease in a large right pleural effusion after thoracentesis.  No pneumothorax. 2.  Right much worse than left airspace disease.  Airspace opacity in the left lung base has increased somewhat since the prior plain films.   Original Report Authenticated By: Holley Dexter, M.D.   Dg Chest 2 View  07/17/2012   *RADIOLOGY REPORT*  Clinical Data: Pneumonia  CHEST - 2 VIEW  Comparison: 07/15/2012  Findings: Right pleural effusion and basilar consolidation.  Patchy airspace disease at the left base.  There is been no significant change.  No pneumothorax.  Borderline cardiomegaly.  IMPRESSION: Stable bilateral airspace disease  and right pleural effusion.   Original Report Authenticated By: Jolaine Click, M.D.   Dg Chest 2 View  07/15/2012   *RADIOLOGY REPORT*  Clinical Data: Shortness of breath, right pleural effusion.  CHEST - 2 VIEW  Comparison: Jul 14, 2012.  Findings: Cardiomediastinal silhouette appears normal.  Minimal left pleural effusion is noted with minimal atelectasis in the left lung base.  No change is noted in large right pleural effusion compared to prior exam with associated pneumonia or atelectasis. No pneumothorax is noted.  IMPRESSION: No change in large right pleural effusion with probable associated pneumonia or atelectasis.   Original Report Authenticated By: Lupita Raider.,  M.D.   Dg Chest 2 View  07/10/2012   *RADIOLOGY REPORT*  Clinical Data: Cough and congestion.  Back pain.  CHEST - 2 VIEW  Comparison: PA and lateral chest 01/18/2012.  Findings: There is right worse than left basilar airspace disease and a small right effusion.  No pneumothorax.  Heart size normal.  IMPRESSION: Right worse than left basilar airspace disease and a small right effusion worrisome for pneumonia.   Original Report Authenticated By: Holley Dexter, M.D.   Ct Chest Wo Contrast  07/13/2012   *RADIOLOGY REPORT*  Clinical Data:  Right pleural effusion, pneumonia, chest pain and shortness of breath.  CT CHEST WITHOUT CONTRAST  Technique:  Multidetector CT imaging of the chest was performed following the standard protocol without IV contrast.  Comparison: 07/10/2012.  Findings: No pathologically enlarged mediastinal or axillary lymph nodes.  Hilar regions are difficult to definitively evaluate without IV contrast.  Heart is at the upper limits of normal in size.  No pericardial effusion.  Small right pleural effusion, partially loculated.  There may be some associated pleural thickening posteromedially.  Amount of pleural fluid has increased slightly in the interval. Collapse/consolidation in the right middle lobe and right lower lobe with less severe disease in the left lower lobe.  There is some confluence at the right hilum (example image 21).  Airway is unremarkable.  Incidental imaging of the upper abdomen shows no acute findings. No worrisome lytic or sclerotic lesions.  IMPRESSION:  1.  Findings are most consistent with multilobar pneumonia, with slight progression from 07/10/2012. 2.  Difficult to exclude a right perihilar soft tissue mass. Follow-up to clearing is recommended. 3.  Minimal loculated right pleural effusion with slight interval increase in size from 07/10/2012 and possible posterior medial pleural thickening.  The possibility of a developing empyema is considered.   Original Report Authenticated By: Leanna Battles, M.D.   Ct Angio Chest W/cm &/or Wo Cm  07/10/2012   *RADIOLOGY REPORT*  Clinical Data: Back pain.  CT ANGIOGRAPHY CHEST  Technique:  Multidetector CT imaging of the chest using the standard protocol during bolus administration of intravenous contrast. Multiplanar reconstructed images including MIPs were obtained and reviewed to evaluate the vascular anatomy.  Contrast: OMNIPAQUE IOHEXOL 350 MG/ML SOLN  Comparison: PA and lateral chest 07/10/2012.  Findings: No pulmonary embolus is identified.  There  is a small right pleural effusion.  No left pleural effusion or pericardial effusion.  Heart size is upper normal.  Lungs demonstrate bibasilar airspace disease, worse on the right, with an appearance most compatible with pneumonia.  Incidentally imaged upper abdomen is unremarkable.  No focal bony abnormality is identified.  IMPRESSION:  1.  Negative for pulmonary embolus. 2.  Right worse than left lower lobe airspace disease and a small right effusion most consistent with pneumonia.   Original Report  Authenticated By: Holley Dexter, M.D.   US Transvaginal Non-ob  07/17/2012   *RADIOLOGY REPORT*  Clinical Data: Indeterminate left adnexal cystic lesion seen on recent CT.  Abdominal pain.  TRANSABDOMINAL AND TRANSVAGINAL ULTRASOUND OF PELVIS  Technique:  Both transabdominal and transvaginal ultrasound examinations of the pelvis were performed.  Transabdominal technique was performed for global imaging of the pelvis including uterus, ovaries, adnexal regions, and pelvic cul-de-sac.  It was necessary to proceed with endovaginal exam following the transabdominal exam to visualize the endometrium and cystic lesion and left adnexa.  Comparison:  CT on 07/15/2012  Findings: Uterus:  9.0 x 4.5 x 5.5 cm.  No fibroids or other uterine mass identified.  Endometrium: Endometrial thickness measures 10 mm transvaginally. No focal lesion visualized.  Right ovary: 2.9 x 1.7 x 2.6 cm.  Normal appearance.  Left ovary: A complex cystic lesion is seen in the left adnexa which measures 5.5 x 3.7 x 4.6 cm.  This lesion shows wall thickening as well as a mural soft tissue nodule measuring approximately 1.7 cm.  It is uncertain whether this nodular area contains internal blood flow or not as color Doppler ultrasound was not applied to this area.  This could represent blood clot, although a solid mural soft tissue nodule due to a cystic ovarian neoplasm cannot be excluded.  Other Findings:  No free fluid  IMPRESSION:  1.  5.5 cm  complex cystic lesion in left adnexa which contains a mural nodule.  Cystic ovarian neoplasm cannot be excluded. Management options include follow-up by ultrasound in 6-12 weeks, or pelvic MRI without with contrast for further characterization. 2.  Normal appearance of uterus and right ovary.   Original Report Authenticated By: Myles Rosenthal, M.D.   US Pelvis Complete  07/17/2012   *RADIOLOGY REPORT*  Clinical Data: Indeterminate left adnexal cystic lesion seen on recent CT.  Abdominal pain.  TRANSABDOMINAL AND TRANSVAGINAL ULTRASOUND OF PELVIS  Technique:  Both transabdominal and transvaginal ultrasound examinations of the pelvis were performed.  Transabdominal technique was performed for global imaging of the pelvis including uterus, ovaries, adnexal regions, and pelvic cul-de-sac.  It was necessary to proceed with endovaginal exam following the transabdominal exam to visualize the endometrium and cystic lesion and left adnexa.  Comparison:  CT on 07/15/2012  Findings: Uterus:  9.0 x 4.5 x 5.5 cm.  No fibroids or other uterine mass identified.  Endometrium: Endometrial thickness measures 10 mm transvaginally. No focal lesion visualized.  Right ovary: 2.9 x 1.7 x 2.6 cm.  Normal appearance.  Left ovary: A complex cystic lesion is seen in the left adnexa which measures 5.5 x 3.7 x 4.6 cm.  This lesion shows wall thickening as well as a mural soft tissue nodule measuring approximately 1.7 cm.  It is uncertain whether this nodular area contains internal blood flow or not as color Doppler ultrasound was not applied to this area.  This could represent blood clot, although a solid mural soft tissue nodule due to a cystic ovarian neoplasm cannot be excluded.  Other Findings:  No free fluid  IMPRESSION:  1.  5.5 cm complex cystic lesion in left adnexa which contains a mural nodule.  Cystic ovarian neoplasm cannot be excluded. Management options include follow-up by ultrasound in 6-12 weeks, or pelvic MRI without with  contrast for further characterization. 2.  Normal appearance of uterus and right ovary.   Original Report Authenticated By: Myles Rosenthal, M.D.   Dg Chest Right Decubitus  07/17/2012   *RADIOLOGY REPORT*  Clinical Data: Evaluate pleural effusion  CHEST - RIGHT DECUBITUS  Comparison: Chest radiograph 07/17/2012 and 07/15/2012  Findings: There is a layering right pleural effusion.  A right basilar opacity could be due to atelectasis or airspace disease. No focal opacities in the left lung on this decubitus view.  IMPRESSION: Layering right pleural effusion.  Probable right basilar airspace disease or atelectasis.   Original Report Authenticated By: Britta Mccreedy, M.D.   Ct Abdomen Pelvis W Contrast  07/15/2012   *RADIOLOGY REPORT*  Clinical Data: Epigastric pain radiating to the right shoulder.  CT ABDOMEN AND PELVIS WITH CONTRAST  Technique:  Multidetector CT imaging of the abdomen and pelvis was performed following the standard protocol during bolus administration of intravenous contrast.  Contrast: OMNIPAQUE IOHEXOL 300 MG/ML  SOLN  Comparison: 07/08/2011CT of the abdomen or pelvis.  More recent comparison to chest CT from 07/13/2012  Findings: Images which include the lung bases showed dense airspace consolidation in the right lower lobe with associated small pleural effusion.  There is patchy airspace disease in the left lower lobe and posterior lingula.  No focal abnormality in the liver or spleen.  The stomach, duodenum, pancreas, and adrenal glands are unremarkable.  The gallbladder is surgically absent.  Kidneys are normal bilaterally.  No abdominal aortic aneurysm.  There is no lymphadenopathy in the abdomen.  Imaging through the pelvis shows a small amount of intraperitoneal free fluid.  3.9 cm posterior left adnexal cystic lesion is identified.  No definite right adnexal mass.  The uterus is unremarkable.  No pelvic sidewall lymphadenopathy.  Bladder is unremarkable.  No appreciable diverticular  change in the colon.  No colonic diverticulitis.  Terminal ileum is normal. The appendix is not visualized, but there is no edema or inflammation in the region of the cecum.  Gas locules in the subcutaneous fat of the right anterior abdominal wall are presumably secondary to an injection site.  Bone windows reveal no worrisome lytic or sclerotic osseous lesions.  IMPRESSION: Incompletely visualized large area of airspace consolidation in the right lower lobe with more patchy airspace consolidation in the lingula and left lower lobe.  These changes are associated with small right pleural effusion, incompletely visualized.  3.9 cm cystic lesion in the left adnexal space.  Pelvic ultrasound recommended to further characterize.   Original Report Authenticated By: Kennith Center, M.D.   Dg Chest Port 1 View  07/14/2012   *RADIOLOGY REPORT*  Clinical Data: Right side chest pain.  Right pleural effusion.  PORTABLE CHEST - 1 VIEW  Comparison: CT and plain film chest 07/13/2012.  Findings: Large right pleural effusion and airspace disease persist.  There is a small left pleural effusion with some basilar airspace disease.  Heart size is normal.  IMPRESSION: No marked change in right much worse than left pleural effusions airspace disease.   Original Report Authenticated By: Holley Dexter, M.D.   Dg Chest Port 1 View  07/13/2012   *RADIOLOGY REPORT*  Clinical Data: Pneumonia.  Effusion.  PORTABLE CHEST - 1 VIEW  Comparison: 07/12/2012  Findings: 0459 hours.  Low volume film. The cardiopericardial silhouette is enlarged.  The by basilar collapse / consolidation, right greater than left, persist.  Right pleural effusion again noted without substantial change. Telemetry leads overlie the chest.  IMPRESSION: Low volume film with right greater than left airspace disease.  No substantial interval change.   Original Report Authenticated By: Kennith Center, M.D.   Dg Chest Portable 1 View  07/12/2012   *RADIOLOGY  REPORT*   Clinical Data: Pneumonia, effusion, shortness of breath  PORTABLE CHEST - 1 VIEW  Comparison: 07/11/2012  Findings: Large right effusion evident with near complete opacification of the right hemithorax.  There remains some aeration of the right upper lobe.  Slight worsening left base atelectasis/airspace disease.  Left upper lung remains clear.  No left effusion.  Negative for pneumothorax.  Trachea is midline.  IMPRESSION: Persistent large right effusion with right lung collapse / consolidation  Slight worsening left base atelectasis/airspace disease   Original Report Authenticated By: Judie Petit. Miles Costain, M.D.   Dg Chest Port 1 View  07/11/2012   *RADIOLOGY REPORT*  Clinical Data: 27 year old female with shortness of breath and chest pain.  PORTABLE CHEST - 1 VIEW  Comparison: Chest CTA 07/10/2012 and earlier.  Findings: Portable semi upright AP view 0615 hours.  Moderate to large right pleural effusion with a sub total atelectasis of the right lung.  Ventilation in the right lung probably is mildly decreased.  Superimposed consolidation at the right lung base was better demonstrated on the comparison.  Early consolidation or moderate atelectasis at the left base also appears stable.  No pneumothorax.  No definite left effusion.  Cardiac size and mediastinal contours are within normal limits.  Visualized tracheal air column is within normal limits.  IMPRESSION: 1.  Moderate-to-large right pleural effusion with compressive atelectasis of the right lung.  Superimposed right lower lobe consolidation better demonstrated on the comparison and compatible with pneumonia. 2.  Stable left lower lobe moderate atelectasis or  developing pneumonia.   Original Report Authenticated By: Erskine Speed, M.D.   US Thoracentesis Asp Pleural Space W/img Guide  07/12/2012   *RADIOLOGY REPORT*  Clinical Data:  Pneumonia, right-sided pleural effusion.  Request for diagnostic and therapeutic thoracentesis  ULTRASOUND GUIDED right THORACENTESIS   Comparison:  Previous chest x-rays and CT chest  An ultrasound guided thoracentesis was thoroughly discussed with the patient and questions answered.  The benefits, risks, alternatives and complications were also discussed.  The patient understands and wishes to proceed with the procedure.  Written consent was obtained.  Ultrasound of the right chest demonstrates a loculated effusion of small to moderate size. Ultrasound was then used to localize and mark an adequate pocket of fluid in the right chest.  The area was then prepped and draped in the normal sterile fashion.  1% Lidocaine was used for local anesthesia.  Under ultrasound guidance a 19 gauge Yueh catheter was introduced.  Thoracentesis was performed.  The catheter was removed and a dressing applied.  Complications:  None immediate  Findings: A total of approximately 300 ml of clear yellow fluid was removed. A fluid sample was sent for laboratory analysis.  IMPRESSION: Partially loculated right pleural effusion.  Successful ultrasound guided right thoracentesis yielding 300 ml of pleural fluid.  Read by Brayton El PA-C   Original Report Authenticated By: Tacey Ruiz, MD    Microbiology: Recent Results (from the past 240 hour(s))  CULTURE, BLOOD (ROUTINE X 2)     Status: None   Collection Time    07/10/12  4:04 AM      Result Value Range Status   Specimen Description BLOOD RIGHT ANTECUBITAL   Final   Special Requests BOTTLES DRAWN AEROBIC AND ANAEROBIC 8CC   Final   Culture NO GROWTH 5 DAYS   Final   Report Status 07/15/2012 FINAL   Final  CULTURE, BLOOD (ROUTINE X 2)     Status: None   Collection Time  07/10/12  4:09 AM      Result Value Range Status   Specimen Description BLOOD RIGHT HAND   Final   Special Requests BOTTLES DRAWN AEROBIC AND ANAEROBIC 7CC   Final   Culture NO GROWTH 5 DAYS   Final   Report Status 07/15/2012 FINAL   Final  MRSA PCR SCREENING     Status: Abnormal   Collection Time    07/10/12  7:08 AM      Result  Value Range Status   MRSA by PCR POSITIVE (*) NEGATIVE Final   Comment:            The GeneXpert MRSA Assay (FDA     approved for NASAL specimens     only), is one component of a     comprehensive MRSA colonization     surveillance program. It is not     intended to diagnose MRSA     infection nor to guide or     monitor treatment for     MRSA infections.     CRITICAL RESULT CALLED TO, READ BACK BY AND VERIFIED WITH:     GORDON, A. AT 1610RU ON 07/10/12 BY PRUITT, C.  CULTURE, BLOOD (ROUTINE X 2)     Status: None   Collection Time    07/12/12  1:15 AM      Result Value Range Status   Specimen Description BLOOD LEFT ARM   Final   Special Requests BOTTLES DRAWN AEROBIC ONLY 10CC   Final   Culture  Setup Time 07/12/2012 10:02   Final   Culture NO GROWTH 5 DAYS   Final   Report Status 07/18/2012 FINAL   Final  CULTURE, BLOOD (ROUTINE X 2)     Status: None   Collection Time    07/12/12  1:27 AM      Result Value Range Status   Specimen Description BLOOD RIGHT ARM   Final   Special Requests BOTTLES DRAWN AEROBIC ONLY 10CC   Final   Culture  Setup Time 07/12/2012 10:00   Final   Culture NO GROWTH 5 DAYS   Final   Report Status 07/18/2012 FINAL   Final  URINE CULTURE     Status: None   Collection Time    07/12/12  4:07 AM      Result Value Range Status   Specimen Description URINE, CLEAN CATCH   Final   Special Requests merem, vanc   Final   Culture  Setup Time 07/12/2012 04:19   Final   Colony Count NO GROWTH   Final   Culture NO GROWTH   Final   Report Status 07/13/2012 FINAL   Final  AFB CULTURE WITH SMEAR     Status: None   Collection Time    07/12/12 11:30 AM      Result Value Range Status   Specimen Description PLEURAL FLUID RIGHT   Final   Special Requests FLUID   Final   ACID FAST SMEAR NO ACID FAST BACILLI SEEN   Final   Culture     Final   Value: CULTURE WILL BE EXAMINED FOR 6 WEEKS BEFORE ISSUING A FINAL REPORT   Report Status PENDING   Incomplete  BODY  FLUID CULTURE     Status: None   Collection Time    07/12/12 11:30 AM      Result Value Range Status   Specimen Description PLEURAL FLUID RIGHT   Final   Special Requests FLUID   Final  Gram Stain     Final   Value: RARE WBC PRESENT, PREDOMINANTLY PMN     NO ORGANISMS SEEN   Culture NO GROWTH 3 DAYS   Final   Report Status 07/16/2012 FINAL   Final     Labs: Basic Metabolic Panel:  Recent Labs Lab 07/14/12 0530 07/15/12 0415 07/16/12 0350 07/17/12 0510 07/19/12 0447  NA 136 135 136 135 137  K 3.7 3.8 3.8 4.0 4.2  CL 102 100 99 100 97  CO2 23 24 28 27 30   GLUCOSE 96 95 89 98 95  BUN 9 9 6 8 12   CREATININE 0.54 0.71 0.63 0.61 0.85  CALCIUM 8.6 8.5 9.2 9.4 10.2   Liver Function Tests: No results found for this basename: AST, ALT, ALKPHOS, BILITOT, PROT, ALBUMIN,  in the last 168 hours No results found for this basename: LIPASE, AMYLASE,  in the last 168 hours No results found for this basename: AMMONIA,  in the last 168 hours CBC:  Recent Labs Lab 07/14/12 0530 07/15/12 0415 07/16/12 0350 07/17/12 0510 07/19/12 0447  WBC 9.1 11.0* 10.5 9.7 10.6*  HGB 8.5* 8.6* 8.8* 9.3* 10.2*  HCT 25.9* 26.4* 26.7* 28.2* 31.2*  MCV 85.5 84.9 84.0 84.7 85.2  PLT 342 387 463* 510* 650*   Cardiac Enzymes: No results found for this basename: CKTOTAL, CKMB, CKMBINDEX, TROPONINI,  in the last 168 hours BNP: BNP (last 3 results) No results found for this basename: PROBNP,  in the last 8760 hours CBG: No results found for this basename: GLUCAP,  in the last 168 hours     Signed:  Samreen Seltzer  [facility] 07/19/2012, 10:35 AM

## 2012-07-19 NOTE — Progress Notes (Signed)
Pt educated and informed of DC information. Pt verbalizes understanding of medications and f/u appts. IV and tele box removed. Pt ready for DC with father.

## 2012-07-20 ENCOUNTER — Telehealth: Payer: Self-pay | Admitting: General Practice

## 2012-07-22 ENCOUNTER — Encounter (HOSPITAL_COMMUNITY): Payer: Self-pay | Admitting: *Deleted

## 2012-07-22 ENCOUNTER — Emergency Department (HOSPITAL_COMMUNITY)
Admission: EM | Admit: 2012-07-22 | Discharge: 2012-07-23 | Disposition: A | Discharge status: Home / Self Care | Payer: Medicaid Other | Attending: Emergency Medicine | Admitting: Emergency Medicine

## 2012-07-22 ENCOUNTER — Emergency Department (HOSPITAL_COMMUNITY): Payer: Medicaid Other

## 2012-07-22 DIAGNOSIS — F1910 Other psychoactive substance abuse, uncomplicated: Secondary | ICD-10-CM | POA: Insufficient documentation

## 2012-07-22 DIAGNOSIS — Z8679 Personal history of other diseases of the circulatory system: Secondary | ICD-10-CM | POA: Insufficient documentation

## 2012-07-22 DIAGNOSIS — Z8659 Personal history of other mental and behavioral disorders: Secondary | ICD-10-CM | POA: Insufficient documentation

## 2012-07-22 DIAGNOSIS — Z79899 Other long term (current) drug therapy: Secondary | ICD-10-CM | POA: Insufficient documentation

## 2012-07-22 DIAGNOSIS — R109 Unspecified abdominal pain: Secondary | ICD-10-CM | POA: Insufficient documentation

## 2012-07-22 DIAGNOSIS — R112 Nausea with vomiting, unspecified: Secondary | ICD-10-CM | POA: Insufficient documentation

## 2012-07-22 DIAGNOSIS — Z792 Long term (current) use of antibiotics: Secondary | ICD-10-CM | POA: Insufficient documentation

## 2012-07-22 DIAGNOSIS — R071 Chest pain on breathing: Secondary | ICD-10-CM | POA: Insufficient documentation

## 2012-07-22 DIAGNOSIS — R531 Weakness: Secondary | ICD-10-CM

## 2012-07-22 DIAGNOSIS — R5383 Other fatigue: Secondary | ICD-10-CM | POA: Insufficient documentation

## 2012-07-22 DIAGNOSIS — R5381 Other malaise: Secondary | ICD-10-CM | POA: Insufficient documentation

## 2012-07-22 DIAGNOSIS — F17200 Nicotine dependence, unspecified, uncomplicated: Secondary | ICD-10-CM | POA: Insufficient documentation

## 2012-07-22 LAB — BASIC METABOLIC PANEL
BUN: 12 mg/dL (ref 6–23)
CO2: 28 mEq/L (ref 19–32)
Chloride: 96 mEq/L (ref 96–112)
Creatinine, Ser: 0.63 mg/dL (ref 0.50–1.10)
GFR calc Af Amer: >90 mL/min (ref >90)
Glucose, Bld: 93 mg/dL (ref 70–99)
Potassium: 4.2 mEq/L (ref 3.5–5.1)

## 2012-07-22 LAB — HEPATIC FUNCTION PANEL
ALT: 28 U/L (ref 0–35)
AST: 22 U/L (ref 0–37)
Albumin: 3.2 g/dL — ABNORMAL LOW (ref 3.5–5.2)
Bilirubin, Direct: <0.1 mg/dL (ref 0.0–0.3)
Total Bilirubin: 0.2 mg/dL — ABNORMAL LOW (ref 0.3–1.2)

## 2012-07-22 LAB — CBC WITH DIFFERENTIAL/PLATELET
HCT: 35.6 % — ABNORMAL LOW (ref 36.0–46.0)
Hemoglobin: 12 g/dL (ref 12.0–15.0)
Lymphocytes Relative: 34 % (ref 12–46)
Lymphs Abs: 3.1 10*3/uL (ref 0.7–4.0)
MCHC: 33.7 g/dL (ref 30.0–36.0)
Monocytes Absolute: 0.5 10*3/uL (ref 0.1–1.0)
Monocytes Relative: 5 % (ref 3–12)
Neutro Abs: 5.2 10*3/uL (ref 1.7–7.7)
Neutrophils Relative %: 58 % (ref 43–77)
RBC: 4.2 MIL/uL (ref 3.87–5.11)
WBC: 9.1 10*3/uL (ref 4.0–10.5)

## 2012-07-22 LAB — URINALYSIS, ROUTINE W REFLEX MICROSCOPIC
Bilirubin Urine: NEGATIVE
Glucose, UA: NEGATIVE mg/dL
Ketones, ur: NEGATIVE mg/dL
Nitrite: NEGATIVE
Specific Gravity, Urine: 1.027 (ref 1.005–1.030)
pH: 5 (ref 5.0–8.0)

## 2012-07-22 MED ORDER — HYDROMORPHONE HCL PF 1 MG/ML IJ SOLN
1.0000 mg | Freq: Once | INTRAMUSCULAR | Status: AC
  Administered 2012-07-22: 1 mg via INTRAVENOUS
  Filled 2012-07-22: qty 1

## 2012-07-22 MED ORDER — ONDANSETRON HCL 4 MG/2ML IJ SOLN
4.0000 mg | Freq: Once | INTRAMUSCULAR | Status: AC
  Administered 2012-07-22: 4 mg via INTRAVENOUS
  Filled 2012-07-22: qty 2

## 2012-07-22 MED ORDER — ALBUTEROL SULFATE HFA 108 (90 BASE) MCG/ACT IN AERS
2.0000 | INHALATION_SPRAY | RESPIRATORY_TRACT | Status: DC | PRN
  Administered 2012-07-22: 2 via RESPIRATORY_TRACT
  Filled 2012-07-22: qty 6.7

## 2012-07-22 MED ORDER — SODIUM CHLORIDE 0.9 % IV BOLUS (SEPSIS)
1000.0000 mL | Freq: Once | INTRAVENOUS | Status: AC
  Administered 2012-07-22: 1000 mL via INTRAVENOUS

## 2012-07-22 NOTE — Discharge Instructions (Signed)
Your Pneumonia is improving.  Continue to take your antibiotic as prescribed for the full duration.  Use inhaler as needed.  Follow up with your doctor as previously scheduled.

## 2012-07-22 NOTE — ED Notes (Signed)
Pt reports being admitted to hospital on 5/10 for pneumonia. Was dc from hospital two days ago and still not feeling any better, being very fatigued today and not wanting to wake up. Pt easily arousable at triage, alert and answering questions appropriately, spo2 95% on room air.

## 2012-07-22 NOTE — ED Provider Notes (Signed)
History     CSN: [id number]  Arrival date & time 07/22/12  1628   First MD Initiated Contact with Patient 07/22/12 1846      Chief Complaint  Patient presents with  . Fatigue    (Consider location/radiation/quality/duration/timing/severity/associated sxs/prior treatment) HPI  27 year old female with history of IV drug use was recently admitted to the hospital on May 10 for pneumonia was discharge 2 days ago is here presenting with generalized weakness. While in hospital patient was diagnosed with having pneumonia and right-sided pleural effusion with subsequent thoracentesis. Her lung function was treated with Unasyn and vancomycin. Patient is currently taking Levaquin. Patient was discharged 2 days ago. States she continues to feels very weak and fatigued, same as when she was in the hospital. She felt sick to stomach, unable to keep anything down except fluids. Endorse nausea and vomiting several times daily. Vomitus is nonbloody, nonbilious. No significant abdominal pain. She continues to endorse pleuritic chest pain especially with coughing. She tries taking her medication but unable to keep down.  Patient felt that she was discharged prematurely. She continues to endorse chest pain. She denies fever, cough productive cough. Denies increased shortness of breath, back pain, dysuria.     Past Medical History  Diagnosis Date  . Headache   . Anxiety   . Drug use     Past Surgical History  Procedure Laterality Date  . Cholecystectomy    . Facial reconstruction surgery    . Cholecystectomy    . Ovarian cyst removal    . Tympanostomy tube placement    . Mastoidectomy revision      Family History  Problem Relation Age of Onset  . Diabetes Maternal Aunt   . Heart disease Maternal Aunt   . Diabetes Maternal Grandmother   . Diabetes Maternal Grandfather   . Heart disease Maternal Grandfather   . Anesthesia problems Neg Hx     History  Substance Use Topics  . Smoking  status: Current Every Day Smoker -- 0.25 packs/day for 4 years    Types: Cigarettes  . Smokeless tobacco: Never Used  . Alcohol Use: No    OB History   Grav Para Term Preterm Abortions TAB SAB Ect Mult Living   2 2 2       2       Review of Systems  Constitutional:       10 Systems reviewed and all are negative for acute change except as noted in the HPI.     Allergies  Nubain  Home Medications   Current Outpatient Rx  Name  Route  Sig  Dispense  Refill  . ferrous sulfate 325 (65 FE) MG tablet   Oral   Take 1 tablet (325 mg total) by mouth 3 (three) times daily with meals.   60 tablet   3   . guaiFENesin (MUCINEX) 600 MG 12 hr tablet   Oral   Take 2 tablets (1,200 mg total) by mouth 2 (two) times daily.   30 tablet   0   . HYDROmorphone (DILAUDID) 4 MG tablet   Oral   Take 4 mg by mouth every 4 (four) hours as needed for pain.         Marland Kitchen levofloxacin (LEVAQUIN) 750 MG tablet   Oral   Take 1 tablet (750 mg total) by mouth daily.   7 tablet   0   . Menthol, Topical Analgesic, (ICY HOT ADVANCED RELIEF EX)   Apply externally   Apply 1  application topically daily as needed (for pain).          . Multiple Vitamins-Minerals (ADULT ONE DAILY GUMMIES) CHEW   Oral   Chew 1 each by mouth daily.         . pantoprazole (PROTONIX) 40 MG tablet   Oral   Take 1 tablet (40 mg total) by mouth daily.   30 tablet   0   . ondansetron (ZOFRAN-ODT) 4 MG disintegrating tablet   Oral   Take 4 mg by mouth 3 (three) times daily.           BP 99/67  Pulse 100  Temp(Src) 97.9 F (36.6 C) (Oral)  Resp 16  SpO2 97%  LMP 07/02/2012  Physical Exam  Nursing note and vitals reviewed. Constitutional: She is oriented to person, place, and time. She appears well-developed and well-nourished. No distress.  Awake, alert, nontoxic appearance  HENT:  Head: Atraumatic.  Mouth/Throat: Oropharynx is clear and moist.  Eyes: Conjunctivae and EOM are normal. Pupils are equal,  round, and reactive to light. Right eye exhibits no discharge. Left eye exhibits no discharge.  Neck: Neck supple.  Cardiovascular: Normal rate and regular rhythm.  Exam reveals no gallop and no friction rub.   No murmur heard. Pulmonary/Chest: Effort normal. No respiratory distress. She has no wheezes. She has no rales. She exhibits no tenderness.  Abdominal: Soft. There is no tenderness (diffused abdominal tenderness without focal point tenderness.  no guarding or rebound tenderness.  ). There is no rebound.  Musculoskeletal: She exhibits no tenderness.  ROM appears intact, no obvious focal weakness  Neurological: She is alert and oriented to person, place, and time. Gait normal. GCS eye subscore is 4. GCS verbal subscore is 5. GCS motor subscore is 6.  Mental status and motor strength appears intact  5/5 strength to all 4 extremities.    Skin: No rash noted.  Psychiatric: She has a normal mood and affect.    ED Course  Procedures (including critical care time)   Date: 07/22/2012  Rate: 95  Rhythm: normal sinus rhythm  QRS Axis: normal  Intervals: normal  ST/T Wave abnormalities: normal  Conduction Disutrbances: none  Narrative Interpretation:   Old EKG Reviewed: No significant changes noted   8:40 PM Report feeling fatigue after being discharge from hospital 2 days ago for pneumonia and pleural effusion.  Continues to endorse pleuritic cp and cough.  Pt otherwise appears nontoxic, baseline mental status.  No focal neuro deficits, 5/5 strength to all 4 extremities  10:44 PM Repeat chest x-ray shows improvement of her pneumonia. I encouraged patient to continue taking her Levaquin for the full duration. Otherwise her labs are unremarkable. Abdominal and chest pain improves after taking pain medication. We'll give patient albuterol inhaler to use at home as needed. Patient will also need to followup with her primary care Dr. for further management. Pt able to tolerates PO and stable  for discharge.  Pt has scheduled f/u appointment with Dr. Solon Augusta.   Labs Reviewed  CBC WITH DIFFERENTIAL - Abnormal; Notable for the following:    HCT 35.6 (*)    Platelets 748 (*)    All other components within normal limits  HEPATIC FUNCTION PANEL - Abnormal; Notable for the following:    Total Protein 9.2 (*)    Albumin 3.2 (*)    Total Bilirubin 0.2 (*)    All other components within normal limits  BASIC METABOLIC PANEL  URINALYSIS, ROUTINE W REFLEX MICROSCOPIC  LIPASE,  BLOOD  LACTIC ACID, PLASMA   Dg Chest 2 View  07/22/2012   *RADIOLOGY REPORT*  Clinical Data: Chest pain and shortness of breath; weakness. History of smoking.  CHEST - 2 VIEW  Comparison: Chest radiograph performed 07/17/2012  Findings: The lungs are well-aerated.  The previously noted right- sided pleural effusion has significantly improved, with residual right basilar airspace opacification, likely reflecting residual pneumonia.  Mild left basilar airspace opacity is also seen.  No pneumothorax is identified.  The heart is normal in size; the mediastinal contour is within normal limits.  No acute osseous abnormalities are seen.  Clips are noted within the right upper quadrant, reflecting prior cholecystectomy.  IMPRESSION: Interval improvement in right basilar airspace opacification and small right pleural effusion, with mild residual left basilar airspace opacification again noted.  This likely reflects improving pneumonia.   Original Report Authenticated By: Tonia Ghent, M.D.     1. Generalized weakness       MDM  BP 112/64  Pulse 104  Temp(Src) 97.9 F (36.6 C) (Oral)  Resp 25  SpO2 98%  LMP 07/08/2012  Breastfeeding? No  I have reviewed nursing notes and vital signs. I personally reviewed the imaging tests through PACS system  I reviewed available ER/hospitalization records thought the EMR         Fayrene Helper, [place] 07/22/12 2317

## 2012-07-25 NOTE — ED Provider Notes (Signed)
Medical screening examination/treatment/procedure(s) were performed by non-physician practitioner and as supervising physician I was immediately available for consultation/collaboration.   Jasiah Elsen M Stephania Macfarlane, MD 07/25/12 1723 

## 2012-07-26 ENCOUNTER — Ambulatory Visit: Payer: Medicaid Other | Attending: Family Medicine | Admitting: Internal Medicine

## 2012-07-26 VITALS — BP 112/73 | HR 82 | Temp 99.0°F | Resp 22 | Ht 63.5 in | Wt 192.0 lb

## 2012-07-26 DIAGNOSIS — R079 Chest pain, unspecified: Secondary | ICD-10-CM | POA: Insufficient documentation

## 2012-07-26 DIAGNOSIS — R0602 Shortness of breath: Secondary | ICD-10-CM | POA: Insufficient documentation

## 2012-07-26 DIAGNOSIS — J8409 Other alveolar and parieto-alveolar conditions: Secondary | ICD-10-CM

## 2012-07-26 DIAGNOSIS — J849 Interstitial pulmonary disease, unspecified: Secondary | ICD-10-CM

## 2012-07-26 MED ORDER — HYDROMORPHONE HCL 2 MG PO TABS: 2.0000 mg | ORAL_TABLET | Freq: Four times a day (QID) | ORAL | Status: DC | PRN

## 2012-07-26 NOTE — Progress Notes (Signed)
Subjective:    Patient ID: Danielle Chandler, female    DOB: 10/08/1985, 27 y.o.   MRN: [id number]  HPI 27 year old female with history of IV drug use was recently admitted to the hospital on May 10 for pneumonia was discharge 2 days ago is here presenting with generalized weakness. While in hospital patient was diagnosed with having pneumonia and right-sided pleural effusion with subsequent thoracentesis. Her lung function was treated with Unasyn and vancomycin. Patient completed a course of Levaquin on 5/27. She was recently in the ER on 5/23 because of nausea and weakness. States she continues to feels very weak and fatigued, same as when she was in the hospital. Nausea has improved and the weakness is still there. She complains of right-sided chest pain or shortness of breath. I called [facility] and the patient filled 30 tablets of Dilaudid 4 mg each on 5/20 There is a refill of Dilaudid on 5/23 however I cannot verify that the patient actually filled this prescription   Review of Systems Past Medical History   Diagnosis  Date   .  Headache    .  Anxiety    .  Drug use     Past Surgical History   Procedure  Laterality  Date   .  Cholecystectomy     .  Facial reconstruction surgery     .  Cholecystectomy     .  Ovarian cyst removal     .  Tympanostomy tube placement     .  Mastoidectomy revision      Family History   Problem  Relation  Age of Onset   .  Diabetes  Maternal Aunt    .  Heart disease  Maternal Aunt    .  Diabetes  Maternal Grandmother    .  Diabetes  Maternal Grandfather    .  Heart disease  Maternal Grandfather    .  Anesthesia problems  Neg Hx     History   Substance Use Topics   .  Smoking status:  Current Every Day Smoker -- 0.25 packs/day for 4 years     Types:  Cigarettes   .  Smokeless tobacco:  Never Used   .  Alcohol Use:  No    OB History    Grav  Para  Term  Preterm  Abortions  TAB  SAB  Ect  Mult  Living    2  2  2        2      Review of  Systems  Constitutional:  10 Systems reviewed and all are negative for acute change except as noted in the HPI.    Allergies   Nubain  Home Medications    Current Outpatient Rx   Name   Route   Sig   Dispense   Refill   .  ferrous sulfate 325 (65 FE) MG tablet   Oral   Take 1 tablet (325 mg total) by mouth 3 (three) times daily with meals.   60 tablet   3   .  guaiFENesin (MUCINEX) 600 MG 12 hr tablet   Oral   Take 2 tablets (1,200 mg total) by mouth 2 (two) times daily.   30 tablet   0   .  HYDROmorphone (DILAUDID) 4 MG tablet   Oral   Take 4 mg by mouth every 4 (four) hours as needed for pain.       Marland Kitchen  levofloxacin (LEVAQUIN) 750 MG tablet  Oral   Take 1 tablet (750 mg total) by mouth daily.   7 tablet   0   .  Menthol, Topical Analgesic, (ICY HOT ADVANCED RELIEF EX)   Apply externally   Apply 1 application topically daily as needed (for pain).       .  Multiple Vitamins-Minerals (ADULT ONE DAILY GUMMIES) CHEW   Oral   Chew 1 each by mouth daily.       .  pantoprazole (PROTONIX) 40 MG tablet   Oral   Take 1 tablet (40 mg total) by mouth daily.   30 tablet   0   .  ondansetron (ZOFRAN-ODT) 4 MG disintegrating tablet   Oral   Take 4 mg by mouth 3 (three) times daily.         BP 99/67  Pulse 100  Temp(Src) 97.9 F (36.6 C) (Oral)  Resp 16  SpO2 97%  LMP 07/02/2012  Physical Exam  Nursing note and vitals reviewed.  Constitutional: She is oriented to person, place, and time. She appears well-developed and well-nourished. No distress.  Awake, alert, nontoxic appearance  HENT:  Head: Atraumatic.  Mouth/Throat: Oropharynx is clear and moist.  Eyes: Conjunctivae and EOM are normal. Pupils are equal, round, and reactive to light. Right eye exhibits no discharge. Left eye exhibits no discharge.  Neck: Neck supple.  Cardiovascular: Normal rate and regular rhythm. Exam reveals no gallop and no friction rub.  No murmur heard.  Pulmonary/Chest: Effort normal. No respiratory distress. She  has no wheezes. She has no rales. She exhibits no tenderness.  Abdominal: Soft. There is no tenderness (diffused abdominal tenderness without focal point tenderness. no guarding or rebound tenderness. ). There is no rebound.  Musculoskeletal: She exhibits no tenderness.  ROM appears intact, no obvious focal weakness  Neurological: She is alert and oriented to person, place, and time. Gait normal. GCS eye subscore is 4. GCS verbal subscore is 5. GCS motor subscore is 6.  Mental status and motor strength appears intact  5/5 strength to all 4 extremities.  Skin: No rash noted.  Psychiatric: She has a normal mood and affect.        Objective:   Physical Exam As above       Assessment & Plan:  Right-sided chest pain Obtain a chest x-ray, patient not particularly hypoxic Obtain a CBC, blood culture x2 Patient is supposed to follow up with Dr. Delton Coombes and needs repeat CT scan in a month Chest x-ray is negative will provide only 15 mg of Dilaudid and refer the patient to a pain clinic Follow up in one week at [facility]

## 2012-07-27 ENCOUNTER — Telehealth: Payer: Self-pay | Admitting: Family Medicine

## 2012-07-27 LAB — CBC WITH DIFFERENTIAL/PLATELET
HCT: 34.3 % — ABNORMAL LOW (ref 36.0–46.0)
Hemoglobin: 11.4 g/dL — ABNORMAL LOW (ref 12.0–15.0)
Lymphs Abs: 2.1 10*3/uL (ref 0.7–4.0)
MCHC: 33.2 g/dL (ref 30.0–36.0)
Monocytes Absolute: 0.3 10*3/uL (ref 0.1–1.0)
Monocytes Relative: 3 % (ref 3–12)
Neutro Abs: 5.4 10*3/uL (ref 1.7–7.7)
Neutrophils Relative %: 67 % (ref 43–77)
RBC: 4.32 MIL/uL (ref 3.87–5.11)

## 2012-08-01 LAB — CULTURE, BLOOD (SINGLE)
Organism ID, Bacteria: NO GROWTH
Organism ID, Bacteria: NO GROWTH

## 2012-08-02 ENCOUNTER — Ambulatory Visit: Payer: No Typology Code available for payment source | Attending: Family Medicine | Admitting: Internal Medicine

## 2012-08-02 VITALS — BP 114/81 | HR 86 | Temp 98.5°F | Resp 18

## 2012-08-02 DIAGNOSIS — J189 Pneumonia, unspecified organism: Secondary | ICD-10-CM

## 2012-08-02 LAB — POCT URINE PREGNANCY: Preg Test, Ur: NEGATIVE

## 2012-08-02 NOTE — Progress Notes (Signed)
Patient here for hospital follow up-pneumonia States still not feeling better Still having pain under her ribs Difficult to sleep at night

## 2012-08-02 NOTE — Progress Notes (Signed)
Patient left the facility without getting blood draw Only obtained urine for pregnancy test

## 2012-08-02 NOTE — Patient Instructions (Signed)
Please do not abuse any prescription narcotics, do not use any recreational drugs, smoking or alcohol.  Come back in 3 days for followup

## 2012-08-02 NOTE — Progress Notes (Addendum)
Patient ID: Danielle Chandler, female   DOB: 1985/10/12, 27 y.o.   MRN: [id number]  Patient Demographics  Danielle Chandler, is a 27 y.o. female  BJY:[id number]  YQM:[id number]  DOB - April 28, 1985  Chief Complaint  Patient presents with  . Follow-up        Subjective:   Danielle Chandler today has, No headache, No abdominal pain - No Nausea, No new weakness tingling or numbness, No Cough - SOB. No fever or chills. Patient with history of polysubstance abuse, cocaine abuse, who was recently admitted to the hospital about 2 weeks ago for right-sided comment he acquired/aspiration pneumonia with pleural effusion, she was treated with appropriate antibiotics with deficits, she has had some residual right-sided pleuritic chest pain which is improving, she comes back today for followup visit.  Objective:   Past Medical History  Diagnosis Date  . Headache(784.0)   . Anxiety   . Drug use   . CAP (community acquired pneumonia)       Past Surgical History  Procedure Laterality Date  . Cholecystectomy    . Facial reconstruction surgery    . Cholecystectomy    . Ovarian cyst removal    . Tympanostomy tube placement    . Mastoidectomy revision       Filed Vitals:   08/02/12 0958  BP: 114/81  Pulse: 86  Temp: 98.5 F (36.9 C)  Resp: 18  SpO2: 99%     Exam  Awake Alert, Oriented X 3, No new F.N deficits, Normal affect Dunn.AT,PERRAL Supple Neck,No JVD, No cervical lymphadenopathy appriciated.  Symmetrical Chest wall movement, Good air movement bilaterally, with slightly reduced breath sounds at the right base. RRR,No Gallops,Rubs or new Murmurs, No Parasternal Heave +ve B.Sounds, Abd Soft, Non tender, No organomegaly appriciated, No rebound - guarding or rigidity. No Cyanosis, Clubbing or edema, No new Rash or bruise       Data Review   CBC  Recent Labs Lab 07/26/12 1317  WBC 8.0  HGB 11.4*  HCT 34.3*  PLT 811*  MCV 79.4  MCH 26.4  MCHC 33.2  RDW 16.1*  LYMPHSABS 2.1   MONOABS 0.3  EOSABS 0.1  BASOSABS 0.1    Chemistries   No results found for this basename: NA, K, CL, CO2, GLUCOSE, BUN, CREATININE, GFRCGP, CALCIUM, MG, AST, ALT, ALKPHOS, BILITOT,  in the last 168 hours ------------------------------------------------------------------------------------------------------------------ No results found for this basename: HGBA1C,  in the last 72 hours ------------------------------------------------------------------------------------------------------------------ No results found for this basename: CHOL, HDL, LDLCALC, TRIG, CHOLHDL, LDLDIRECT,  in the last 72 hours ------------------------------------------------------------------------------------------------------------------ No results found for this basename: TSH, T4TOTAL, FREET3, T3FREE, THYROIDAB,  in the last 72 hours ------------------------------------------------------------------------------------------------------------------ No results found for this basename: VITAMINB12, FOLATE, FERRITIN, TIBC, IRON, RETICCTPCT,  in the last 72 hours  Coagulation profile  No results found for this basename: INR, PROTIME,  in the last 168 hours     Prior to Admission medications   Medication Sig Start Date End Date Taking? Authorizing Provider  ferrous sulfate 325 (65 FE) MG tablet Take 1 tablet (325 mg total) by mouth 3 (three) times daily with meals. 07/19/12   Belkys A Regalado, MD  guaiFENesin (MUCINEX) 600 MG 12 hr tablet Take 2 tablets (1,200 mg total) by mouth 2 (two) times daily. 07/19/12   Belkys A Regalado, MD  HYDROmorphone (DILAUDID) 2 MG tablet Take 1 tablet (2 mg total) by mouth every 6 (six) hours as needed for pain. 07/26/12   Richarda Overlie, MD  levofloxacin (LEVAQUIN) 750  MG tablet Take 1 tablet (750 mg total) by mouth daily. 07/19/12   Belkys A Regalado, MD  Multiple Vitamins-Minerals (ADULT ONE DAILY GUMMIES) CHEW Chew 1 each by mouth daily.    Historical Provider, MD  ondansetron  (ZOFRAN-ODT) 4 MG disintegrating tablet Take 4 mg by mouth 3 (three) times daily.    Historical Provider, MD  pantoprazole (PROTONIX) 40 MG tablet Take 1 tablet (40 mg total) by mouth daily. 07/19/12   Alba Cory, MD     Assessment & Plan    1. Polysubstance abuse, cocaine abuse with recent right-sided committed for pneumonia/aspiration pneumonia with mild pleural effusion. Improving but some residual right-sided pleuritic chest pain, last CBC 1 week ago is stable, she is afebrile, looks nontoxic and in no distress.  At this time we will repeat a urine pregnancy, urine drug screen, if urine pregnancy is negative she will get a 2 view chest x-ray along with a CBC with differential. She will come back in 3 days to followup on these results, she has supplies of tramadol given to her in the ER per patient few weeks ago and over-the-counter Motrin at home which will be continued for pain control if needed. No further Dilaudid or other narcotics were refilled this visit. She already has a followup appointment with Dr. Jasmine Awe pulmonology in the coming few weeks which she will maintained. She was counseled to abstain from smoking, alcohol abuse, recreational abuse, prescription narcotic abuse.     Addendum. He should provided a urine sample, she was clearly told to wait till we obtained blood work, however she left without getting blood work. I doubt she is under show up for chest x-ray tomorrow at [facility], she appears to be noncompliant and was interested only in seeking narcotics. She should not be seen in this clinic for further followups.   Leroy Sea M.D on 08/02/2012 at 10:21 AM

## 2012-08-12 ENCOUNTER — Emergency Department (HOSPITAL_COMMUNITY)
Admission: EM | Admit: 2012-08-12 | Discharge: 2012-08-12 | Disposition: A | Discharge status: Home / Self Care | Payer: Medicaid Other | Attending: Emergency Medicine | Admitting: Emergency Medicine

## 2012-08-12 ENCOUNTER — Emergency Department (HOSPITAL_COMMUNITY): Payer: Medicaid Other

## 2012-08-12 ENCOUNTER — Encounter (HOSPITAL_COMMUNITY): Payer: Self-pay | Admitting: Emergency Medicine

## 2012-08-12 DIAGNOSIS — Z9889 Other specified postprocedural states: Secondary | ICD-10-CM | POA: Insufficient documentation

## 2012-08-12 DIAGNOSIS — R509 Fever, unspecified: Secondary | ICD-10-CM | POA: Insufficient documentation

## 2012-08-12 DIAGNOSIS — Z9089 Acquired absence of other organs: Secondary | ICD-10-CM | POA: Insufficient documentation

## 2012-08-12 DIAGNOSIS — Z3202 Encounter for pregnancy test, result negative: Secondary | ICD-10-CM | POA: Insufficient documentation

## 2012-08-12 DIAGNOSIS — Z8659 Personal history of other mental and behavioral disorders: Secondary | ICD-10-CM | POA: Insufficient documentation

## 2012-08-12 DIAGNOSIS — R42 Dizziness and giddiness: Secondary | ICD-10-CM | POA: Insufficient documentation

## 2012-08-12 DIAGNOSIS — R5381 Other malaise: Secondary | ICD-10-CM | POA: Insufficient documentation

## 2012-08-12 DIAGNOSIS — R109 Unspecified abdominal pain: Secondary | ICD-10-CM

## 2012-08-12 DIAGNOSIS — Z8701 Personal history of pneumonia (recurrent): Secondary | ICD-10-CM | POA: Insufficient documentation

## 2012-08-12 DIAGNOSIS — R110 Nausea: Secondary | ICD-10-CM | POA: Insufficient documentation

## 2012-08-12 DIAGNOSIS — F17200 Nicotine dependence, unspecified, uncomplicated: Secondary | ICD-10-CM | POA: Insufficient documentation

## 2012-08-12 LAB — CBC WITH DIFFERENTIAL/PLATELET
Basophils Absolute: 0 10*3/uL (ref 0.0–0.1)
Eosinophils Relative: 0 % (ref 0–5)
HCT: 36.4 % (ref 36.0–46.0)
Hemoglobin: 12.2 g/dL (ref 12.0–15.0)
Lymphocytes Relative: 13 % (ref 12–46)
Lymphs Abs: 1.6 10*3/uL (ref 0.7–4.0)
MCV: 83.5 fL (ref 78.0–100.0)
Monocytes Absolute: 0.3 10*3/uL (ref 0.1–1.0)
Monocytes Relative: 3 % (ref 3–12)
Neutro Abs: 9.9 10*3/uL — ABNORMAL HIGH (ref 1.7–7.7)
RBC: 4.36 MIL/uL (ref 3.87–5.11)
WBC: 11.8 10*3/uL — ABNORMAL HIGH (ref 4.0–10.5)

## 2012-08-12 LAB — URINE MICROSCOPIC-ADD ON

## 2012-08-12 LAB — COMPREHENSIVE METABOLIC PANEL
AST: 18 U/L (ref 0–37)
BUN: 6 mg/dL (ref 6–23)
CO2: 25 mEq/L (ref 19–32)
Chloride: 101 mEq/L (ref 96–112)
Creatinine, Ser: 0.7 mg/dL (ref 0.50–1.10)
GFR calc Af Amer: >90 mL/min (ref >90)
GFR calc non Af Amer: >90 mL/min (ref >90)
Glucose, Bld: 102 mg/dL — ABNORMAL HIGH (ref 70–99)
Total Bilirubin: 0.6 mg/dL (ref 0.3–1.2)

## 2012-08-12 LAB — URINALYSIS, ROUTINE W REFLEX MICROSCOPIC
Glucose, UA: NEGATIVE mg/dL
Ketones, ur: NEGATIVE mg/dL
Protein, ur: NEGATIVE mg/dL
Urobilinogen, UA: 1 mg/dL (ref 0.0–1.0)

## 2012-08-12 MED ORDER — OXYCODONE-ACETAMINOPHEN 5-325 MG PO TABS
2.0000 | ORAL_TABLET | Freq: Once | ORAL | Status: AC
  Administered 2012-08-12: 2 via ORAL
  Filled 2012-08-12: qty 2

## 2012-08-12 MED ORDER — NAPROXEN 500 MG PO TABS: 500.0000 mg | ORAL_TABLET | Freq: Two times a day (BID) | ORAL | Status: DC

## 2012-08-12 NOTE — ED Notes (Signed)
Pt presents to ED via EMS from home for right flank pain, fever and right shoulder pain.

## 2012-08-12 NOTE — ED Provider Notes (Signed)
History     CSN: [id number]  Arrival date & time 08/12/12  1718   First MD Initiated Contact with Patient 08/12/12 1922      Chief Complaint  Patient presents with  . Flank Pain    (Consider location/radiation/quality/duration/timing/severity/associated sxs/prior treatment) Patient is a 27 y.o. female presenting with abdominal pain. The history is provided by the patient.  Abdominal Pain This is a recurrent problem. The current episode started 1 to 4 weeks ago. The problem occurs constantly. The problem has been gradually worsening. Associated symptoms include abdominal pain, fatigue, a fever (up to 103 at home today) and nausea. Pertinent negatives include no chest pain, chills, congestion, coughing, headaches, numbness, sore throat, vomiting or weakness. Exacerbated by: coughing, breathing. Treatments tried: goodies ASA. The treatment provided no relief.    Past Medical History  Diagnosis Date  . Headache(784.0)   . Anxiety   . Drug use   . CAP (community acquired pneumonia)     Past Surgical History  Procedure Laterality Date  . Cholecystectomy    . Facial reconstruction surgery    . Cholecystectomy    . Ovarian cyst removal    . Tympanostomy tube placement    . Mastoidectomy revision      Family History  Problem Relation Age of Onset  . Diabetes Maternal Aunt   . Heart disease Maternal Aunt   . Diabetes Maternal Grandmother   . Diabetes Maternal Grandfather   . Heart disease Maternal Grandfather   . Anesthesia problems Neg Hx     History  Substance Use Topics  . Smoking status: Current Every Day Smoker -- 0.25 packs/day for 4 years    Types: Cigarettes  . Smokeless tobacco: Never Used  . Alcohol Use: No    OB History   Grav Para Term Preterm Abortions TAB SAB Ect Mult Living   2 2 2       2       Review of Systems  Constitutional: Positive for fever (up to 103 at home today) and fatigue. Negative for chills.  HENT: Negative for congestion, sore  throat and rhinorrhea.   Respiratory: Negative for cough, chest tightness and shortness of breath.   Cardiovascular: Negative for chest pain.  Gastrointestinal: Positive for nausea and abdominal pain. Negative for vomiting, diarrhea, constipation and rectal pain.  Genitourinary: Negative for dysuria.  Neurological: Positive for light-headedness. Negative for dizziness, weakness, numbness and headaches.  All other systems reviewed and are negative.    Allergies  Nubain  Home Medications   Current Outpatient Rx  Name  Route  Sig  Dispense  Refill  . HYDROmorphone (DILAUDID) 2 MG tablet   Oral   Take 1 tablet (2 mg total) by mouth every 6 (six) hours as needed for pain.   15 tablet   0     BP 110/69  Pulse 103  Temp(Src) 98.8 F (37.1 C) (Oral)  Resp 18  Ht 5\' 3"  (1.6 m)  Wt 165 lb (74.844 kg)  BMI 29.24 kg/m2  SpO2 98%  LMP 07/08/2012  Physical Exam  Nursing note and vitals reviewed. Constitutional: She is oriented to person, place, and time. She appears well-developed and well-nourished. She appears distressed (tearful).  HENT:  Head: Normocephalic and atraumatic.  Mouth/Throat: Oropharynx is clear and moist.  Eyes: EOM are normal. Pupils are equal, round, and reactive to light.  Neck: Normal range of motion. Neck supple.  Cardiovascular: Normal rate, regular rhythm and normal heart sounds.  Exam reveals no friction  rub.   No murmur heard. Pulmonary/Chest: Effort normal and breath sounds normal. No respiratory distress. She has no wheezes. She has no rales.  Poor effort but clear  Abdominal: Soft. There is tenderness (mild right flank and RUQ TTP). There is no rebound and no guarding.  Musculoskeletal: Normal range of motion. She exhibits no edema and no tenderness.  Lymphadenopathy:    She has no cervical adenopathy.  Neurological: She is alert and oriented to person, place, and time.  Skin: Skin is warm and dry. No rash noted.  Psychiatric: She has a normal mood  and affect. Her behavior is normal.    ED Course  Procedures (including critical care time)  Labs Reviewed  CBC WITH DIFFERENTIAL - Abnormal; Notable for the following:    WBC 11.8 (*)    RDW 15.8 (*)    Neutrophils Relative % 84 (*)    Neutro Abs 9.9 (*)    All other components within normal limits  COMPREHENSIVE METABOLIC PANEL - Abnormal; Notable for the following:    Glucose, Bld 102 (*)    All other components within normal limits  URINALYSIS, ROUTINE W REFLEX MICROSCOPIC - Abnormal; Notable for the following:    pH 8.5 (*)    Leukocytes, UA MODERATE (*)    All other components within normal limits  URINE MICROSCOPIC-ADD ON - Abnormal; Notable for the following:    Squamous Epithelial / LPF MANY (*)    Bacteria, UA FEW (*)    All other components within normal limits  URINE CULTURE  POCT PREGNANCY, URINE   Dg Chest 2 View  08/12/2012   *RADIOLOGY REPORT*  Clinical Data: Right flank pain  CHEST - 2 VIEW  Comparison: 07/22/2012  Findings: Cardiomediastinal silhouette is stable.  Elevation of the right hemidiaphragm again noted.  No pulmonary edema.  Mild right basilar atelectasis or scarring.  IMPRESSION: No pulmonary edema.  Mild right basilar atelectasis or scarring.   Original Report Authenticated By: Natasha Mead, M.D.     1. Abdominal pain       MDM  4:72 PM 27 year old female with a history of IV drug abuse admitted from 5/10-5/20 with  CAP. She was originally admitted to [facility] but was transferred to [facility] cone due to respiratory failure. She was treated with Unasyn and vancomycin and discharged on Levaquin. She presents now with right flank pain radiating into her right shoulder, nausea and fever to 103 at home with concern for pneumonia. She denies cough or sick contacts. She states these symptoms are similar to her pneumonia in the past. She endorses generalized weakness and fatigue. She reports taking a goodies aspirin 30 minutes prior to arrival. She is afebrile  here and in no distress. She has mild pain overwrite flank and right upper quadrant. She is status post cholecystectomy. Mild leukocytosis noted on labs. We'll check urine, labs chest x-ray.  10:31 PM labs with no significant abnormality. CXR clear, shows some scarring but no PNA, no effusion. Mild leukocytosis, however likely stress response, do not feel she has infectious process. BP stable. Pulse 103 in triage, but has not been tachycardic during my evaluation. Not hypoxic. Pt c/o dizziness. Orthostatics normal. Spoke with pt's mother on phone. She desires pt to be observed over night because she continues to go back and forth. It was explained that there is no indication for admission in this instance as she has no PNA, no effusion, is not hypoxic. This is chronic pain that has been present since  she was admitted for PNA. Mother frustrated, however again explained that there is no indication for admission. It was explained that she needs to f/u with her PCP for further evaluation. Will not dc with narcotic medication. Pt dc'd home in stable condition.   Caren Hazy, MD 08/13/12 [phone]

## 2012-08-13 ENCOUNTER — Encounter (HOSPITAL_COMMUNITY): Payer: Self-pay | Admitting: Emergency Medicine

## 2012-08-13 ENCOUNTER — Emergency Department (HOSPITAL_COMMUNITY)
Admission: EM | Admit: 2012-08-13 | Discharge: 2012-08-13 | Disposition: A | Discharge status: Home / Self Care | Payer: Medicaid Other | Attending: Emergency Medicine | Admitting: Emergency Medicine

## 2012-08-13 ENCOUNTER — Emergency Department (HOSPITAL_COMMUNITY): Payer: Medicaid Other

## 2012-08-13 DIAGNOSIS — Z8679 Personal history of other diseases of the circulatory system: Secondary | ICD-10-CM | POA: Insufficient documentation

## 2012-08-13 DIAGNOSIS — R05 Cough: Secondary | ICD-10-CM | POA: Insufficient documentation

## 2012-08-13 DIAGNOSIS — M546 Pain in thoracic spine: Secondary | ICD-10-CM | POA: Insufficient documentation

## 2012-08-13 DIAGNOSIS — F1910 Other psychoactive substance abuse, uncomplicated: Secondary | ICD-10-CM | POA: Insufficient documentation

## 2012-08-13 DIAGNOSIS — J189 Pneumonia, unspecified organism: Secondary | ICD-10-CM

## 2012-08-13 DIAGNOSIS — F17200 Nicotine dependence, unspecified, uncomplicated: Secondary | ICD-10-CM | POA: Insufficient documentation

## 2012-08-13 DIAGNOSIS — R059 Cough, unspecified: Secondary | ICD-10-CM | POA: Insufficient documentation

## 2012-08-13 MED ORDER — HYDROCOD POLST-CHLORPHEN POLST 10-8 MG/5ML PO LQCR
5.0000 mL | Freq: Two times a day (BID) | ORAL | Status: DC | PRN
Start: 2012-08-13 — End: 2012-10-20

## 2012-08-13 MED ORDER — ONDANSETRON HCL 4 MG/2ML IJ SOLN
4.0000 mg | Freq: Once | INTRAMUSCULAR | Status: AC
  Administered 2012-08-13: 4 mg via INTRAVENOUS
  Filled 2012-08-13: qty 2

## 2012-08-13 MED ORDER — SODIUM CHLORIDE 0.9 % IV SOLN: Freq: Once | INTRAVENOUS | Status: DC

## 2012-08-13 MED ORDER — FENTANYL CITRATE 0.05 MG/ML IJ SOLN: 50.0000 ug | Freq: Once | INTRAMUSCULAR | Status: DC

## 2012-08-13 MED ORDER — KETOROLAC TROMETHAMINE 30 MG/ML IJ SOLN: 30.0000 mg | Freq: Once | INTRAMUSCULAR | Status: DC

## 2012-08-13 MED ORDER — IOHEXOL 350 MG/ML SOLN
100.0000 mL | Freq: Once | INTRAVENOUS | Status: AC | PRN
  Administered 2012-08-13: 100 mL via INTRAVENOUS

## 2012-08-13 MED ORDER — LEVOFLOXACIN 500 MG PO TABS
500.0000 mg | ORAL_TABLET | Freq: Once | ORAL | Status: AC
  Administered 2012-08-13: 500 mg via ORAL
  Filled 2012-08-13: qty 1

## 2012-08-13 MED ORDER — KETOROLAC TROMETHAMINE 30 MG/ML IJ SOLN
30.0000 mg | Freq: Once | INTRAMUSCULAR | Status: AC
  Administered 2012-08-13: 30 mg via INTRAVENOUS
  Filled 2012-08-13: qty 1

## 2012-08-13 MED ORDER — ACETAMINOPHEN 325 MG PO TABS
650.0000 mg | ORAL_TABLET | Freq: Once | ORAL | Status: AC
  Administered 2012-08-13: 650 mg via ORAL
  Filled 2012-08-13: qty 2

## 2012-08-13 MED ORDER — SODIUM CHLORIDE 0.9 % IV SOLN
Freq: Once | INTRAVENOUS | Status: AC
  Administered 2012-08-13: 06:00:00 via INTRAVENOUS

## 2012-08-13 MED ORDER — LEVOFLOXACIN 500 MG PO TABS: 500.0000 mg | ORAL_TABLET | Freq: Every day | ORAL | Status: DC

## 2012-08-13 NOTE — ED Provider Notes (Signed)
History     CSN: [id number]  Arrival date & time 08/13/12  0347   First MD Initiated Contact with Patient 08/13/12 [phone]      Chief Complaint  Patient presents with  . Shortness of Breath    (Consider location/radiation/quality/duration/timing/severity/associated sxs/prior treatment) HPI Comments: Pt reports worsening pain in upper back diffusely, moreso on right side ever since being in the ICU here at [facility] in early May diagnosed with severe pneumonia.  Per records, pt has a h/o cocaine, use, heroin IV use, THC use.  Pt denies any drug use excpet for a little THC a long time ago.  She was seen earlier tonight for same complaint, had a negative CXR and released to home. Prior records show reluctance to give narcotics due to the above history that pt denies.  No fevers.  She reprots not feeling improved like she thinks she should after this pneumonia.  She denies calf swelling or pain.  Pain is somewhat worse with both movement, palpation as well as cough or deep breath.  No CP.  She denies hematuria, dysuria, GYN symptoms.  She has percocet already at home, took some tonight with no relief at all.    Patient is a 27 y.o. female presenting with shortness of breath. The history is provided by the patient and medical records.  Shortness of Breath Severity:  Severe Onset quality:  Gradual Duration:  2 months Progression:  Waxing and waning Chronicity:  Chronic Relieved by:  Nothing Worsened by:  Deep breathing and coughing (palpation) Ineffective treatments:  NSAIDs (narcotics) Associated symptoms: cough   Associated symptoms: no chest pain, no diaphoresis, no fever, no neck pain, no vomiting and no wheezing   Risk factors: no hx of PE/DVT     Past Medical History  Diagnosis Date  . Headache(784.0)   . Anxiety   . Drug use   . CAP (community acquired pneumonia)     Past Surgical History  Procedure Laterality Date  . Cholecystectomy    . Facial reconstruction surgery    .  Cholecystectomy    . Ovarian cyst removal    . Tympanostomy tube placement    . Mastoidectomy revision      Family History  Problem Relation Age of Onset  . Diabetes Maternal Aunt   . Heart disease Maternal Aunt   . Diabetes Maternal Grandmother   . Diabetes Maternal Grandfather   . Heart disease Maternal Grandfather   . Anesthesia problems Neg Hx     History  Substance Use Topics  . Smoking status: Current Every Day Smoker -- 0.25 packs/day for 4 years    Types: Cigarettes  . Smokeless tobacco: Never Used  . Alcohol Use: No    OB History   Grav Para Term Preterm Abortions TAB SAB Ect Mult Living   2 2 2       2       Review of Systems  Constitutional: Negative for fever, chills and diaphoresis.  HENT: Negative for neck pain.   Respiratory: Positive for cough and shortness of breath. Negative for chest tightness and wheezing.   Cardiovascular: Negative for chest pain.  Gastrointestinal: Negative for vomiting.  Musculoskeletal: Positive for back pain.  All other systems reviewed and are negative.    Allergies  Nubain  Home Medications   Current Outpatient Rx  Name  Route  Sig  Dispense  Refill  . chlorpheniramine-HYDROcodone (TUSSIONEX PENNKINETIC ER) 10-8 MG/5ML LQCR   Oral   Take 5 mLs by mouth  every 12 (twelve) hours as needed.   80 mL   0   . levofloxacin (LEVAQUIN) 500 MG tablet   Oral   Take 1 tablet (500 mg total) by mouth daily.   10 tablet   0     BP 103/54  Pulse 85  Temp(Src) 98.6 F (37 C) (Oral)  Resp 16  SpO2 100%  LMP 08/05/2012  Breastfeeding? No  Physical Exam  Nursing note and vitals reviewed. Constitutional: She appears well-developed and well-nourished.  HENT:  Head: Atraumatic.  Eyes: EOM are normal. No scleral icterus.  Neck: Normal range of motion. Neck supple.  Cardiovascular: Normal rate and regular rhythm.   Pulmonary/Chest: Breath sounds normal.  Abdominal: Soft. She exhibits no distension. There is no  tenderness.  Musculoskeletal:       Right shoulder: She exhibits normal range of motion, no tenderness and no deformity.       Thoracic back: She exhibits no bony tenderness and no deformity.       Lumbar back: She exhibits no tenderness, no bony tenderness, no deformity, no pain and no spasm.       Back:  Neurological: She is alert. Coordination normal.  Skin: Skin is warm. No rash noted.    ED Course  Procedures (including critical care time)  Labs Reviewed - No data to display Dg Chest 2 View  08/12/2012   *RADIOLOGY REPORT*  Clinical Data: Right flank pain  CHEST - 2 VIEW  Comparison: 07/22/2012  Findings: Cardiomediastinal silhouette is stable.  Elevation of the right hemidiaphragm again noted.  No pulmonary edema.  Mild right basilar atelectasis or scarring.  IMPRESSION: No pulmonary edema.  Mild right basilar atelectasis or scarring.   Original Report Authenticated By: Natasha Mead, M.D.   Ct Angio Chest W/cm &/or Wo Cm  08/13/2012   *RADIOLOGY REPORT*  Clinical Data: Shortness of breath, flank pain.  CT ANGIOGRAPHY CHEST  Technique:  Multidetector CT imaging of the chest using the standard protocol during bolus administration of intravenous contrast. Multiplanar reconstructed images including MIPs were obtained and reviewed to evaluate the vascular anatomy.  Contrast: OMNIPAQUE IOHEXOL 350 MG/ML SOLN  Comparison: 07/13/2012  Findings: There is good contrast opacification of the pulmonary artery branches.  No discrete filling defect to suggest acute PE. Incomplete opacification of the thoracic aorta.  No pleural or pericardial effusion.  No hilar or mediastinal adenopathy. Residual thymic tissue noted.  Patchy pleural-based areas of subsegmental atelectasis or consolidation in superior, posterior medial basal segments of the right lower lobe.  Left lung clear. Visualized portions of upper abdomen unremarkable. Regional bones unremarkable.  IMPRESSION:  1.  Negative for acute PE. 2.   Patchy atelectasis or consolidation in the right lower lobe.   Original Report Authenticated By: D. Andria Rhein, MD     1. Healthcare-associated pneumonia     ra sat is 100%, normal  5:58 AM Pt given IV Toradol, Zofran.  Will get chest CT to r/o PE, subtle pneumonia.  Police will not further investigate pt.  Given her numerous records indicating prior drug abuse, likely narcotic seeking behavior as indicated in some prior office notes, I will not give her narcotics, but will address pain needs with non narcotic means.     7:01 AM CT angio reveals no PE, slight atelectasis versus infiltrate at right base.  With possibly irritation of diaphragm, right upper back pain referred is possible.  Don't know why it seems so easily reproducible on exam.  I would  still not give heavy narcotics to pt.   Give additional abx for pneumonia is reasonable.  No hypoxia. WBC was 11.8 yesterday.    MDM    4:33 AM Due to significant discrepancies between what pt claims and what is in the chart, her insistence that she has never done drugs and was not admitted to [facility] a few months ago, I have asked registration to see if a possible mistaken patient was entered.  Pt has given registration 2 different SS numbers, and registration have asked [place] police to investigate her true ID.  Otherwise pt's vitals signs are normal.  I doubt a sig respiratory process is occurring.  sats are normal at 100%, doubt PE, dissection.  Pain in upper right posterior shoulder and back are is very reproducible making this most likely musculoskeletal pain without concern for significant bony injury.  NSAIDs would be appropriate treatment for her.   Results for orders placed during the hospital encounter of 08/12/12 (from the past 24 hour(s))  CBC WITH DIFFERENTIAL     Status: Abnormal   Collection Time    08/12/12  5:57 PM      Result Value Range   WBC 11.8 (*) 4.0 - 10.5 K/uL   RBC 4.36  3.87 - 5.11 MIL/uL   Hemoglobin 12.2   12.0 - 15.0 g/dL   HCT 16.1  09.6 - 04.5 %   MCV 83.5  78.0 - 100.0 fL   MCH 28.0  26.0 - 34.0 pg   MCHC 33.5  30.0 - 36.0 g/dL   RDW 40.9 (*) 81.1 - 91.4 %   Platelets 278  150 - 400 K/uL   Neutrophils Relative % 84 (*) 43 - 77 %   Neutro Abs 9.9 (*) 1.7 - 7.7 K/uL   Lymphocytes Relative 13  12 - 46 %   Lymphs Abs 1.6  0.7 - 4.0 K/uL   Monocytes Relative 3  3 - 12 %   Monocytes Absolute 0.3  0.1 - 1.0 K/uL   Eosinophils Relative 0  0 - 5 %   Eosinophils Absolute 0.0  0.0 - 0.7 K/uL   Basophils Relative 0  0 - 1 %   Basophils Absolute 0.0  0.0 - 0.1 K/uL  COMPREHENSIVE METABOLIC PANEL     Status: Abnormal   Collection Time    08/12/12  5:57 PM      Result Value Range   Sodium 135  135 - 145 mEq/L   Potassium 4.1  3.5 - 5.1 mEq/L   Chloride 101  96 - 112 mEq/L   CO2 25  19 - 32 mEq/L   Glucose, Bld 102 (*) 70 - 99 mg/dL   BUN 6  6 - 23 mg/dL   Creatinine, Ser 7.82  0.50 - 1.10 mg/dL   Calcium 9.9  8.4 - 95.6 mg/dL   Total Protein 8.1  6.0 - 8.3 g/dL   Albumin 3.6  3.5 - 5.2 g/dL   AST 18  0 - 37 U/L   ALT 14  0 - 35 U/L   Alkaline Phosphatase 85  39 - 117 U/L   Total Bilirubin 0.6  0.3 - 1.2 mg/dL   GFR calc non Af Amer >90  >90 mL/min   GFR calc Af Amer >90  >90 mL/min  POCT PREGNANCY, URINE     Status: None   Collection Time    08/12/12  6:13 PM      Result Value Range   Preg Test, Ur NEGATIVE  NEGATIVE  URINALYSIS, ROUTINE W REFLEX MICROSCOPIC     Status: Abnormal   Collection Time    08/12/12  7:25 PM      Result Value Range   Color, Urine YELLOW  YELLOW   APPearance CLEAR  CLEAR   Specific Gravity, Urine 1.008  1.005 - 1.030   pH 8.5 (*) 5.0 - 8.0   Glucose, UA NEGATIVE  NEGATIVE mg/dL   Hgb urine dipstick NEGATIVE  NEGATIVE   Bilirubin Urine NEGATIVE  NEGATIVE   Ketones, ur NEGATIVE  NEGATIVE mg/dL   Protein, ur NEGATIVE  NEGATIVE mg/dL   Urobilinogen, UA 1.0  0.0 - 1.0 mg/dL   Nitrite NEGATIVE  NEGATIVE   Leukocytes, UA MODERATE (*) NEGATIVE  URINE  MICROSCOPIC-ADD ON     Status: Abnormal   Collection Time    08/12/12  7:25 PM      Result Value Range   Squamous Epithelial / LPF MANY (*) RARE   WBC, UA 3-6  <3 WBC/hpf   RBC / HPF 0-2  <3 RBC/hpf   Bacteria, UA FEW (*) RARE             Gavin Pound. Oletta Lamas, MD 08/13/12 [phone]

## 2012-08-13 NOTE — ED Notes (Signed)
PT. ARRIVED WITH EMS REPORTS SOB ONSET THIS EVENING , SEEN HERE YESTERDAY FOR FLANK PAIN PRESCRIBED WITH NAPROSYN . AMBULATORY.

## 2012-08-13 NOTE — ED Provider Notes (Signed)
I saw and evaluated the patient, reviewed the resident's note and I agree with the findings and plan.  Pt with flank pain, recently had pneumonia and effusion but none today. No indication for re-admission. Stable for discharge.   Loraine Bhullar B. Bernette Mayers, MD 08/13/12 [phone]

## 2012-08-13 NOTE — Discharge Instructions (Signed)
 Pneumonia, Adult Pneumonia is an infection of the lungs.  CAUSES Pneumonia may be caused by bacteria or a virus. Usually, these infections are caused by breathing infectious particles into the lungs (respiratory tract). SYMPTOMS   Cough.  Fever.  Chest pain.  Increased rate of breathing.  Wheezing.  Mucus production. DIAGNOSIS  If you have the common symptoms of pneumonia, your caregiver will typically confirm the diagnosis with a chest X-ray. The X-ray will show an abnormality in the lung (pulmonary infiltrate) if you have pneumonia. Other tests of your blood, urine, or sputum may be done to find the specific cause of your pneumonia. Your caregiver may also do tests (blood gases or pulse oximetry) to see how well your lungs are working. TREATMENT  Some forms of pneumonia may be spread to other people when you cough or sneeze. You may be asked to wear a mask before and during your exam. Pneumonia that is caused by bacteria is treated with antibiotic medicine. Pneumonia that is caused by the influenza virus may be treated with an antiviral medicine. Most other viral infections must run their course. These infections will not respond to antibiotics.  PREVENTION A pneumococcal shot (vaccine) is available to prevent a common bacterial cause of pneumonia. This is usually suggested for:  People over 62 years old.  Patients on chemotherapy.  People with chronic lung problems, such as bronchitis or emphysema.  People with immune system problems. If you are over 65 or have a high risk condition, you may receive the pneumococcal vaccine if you have not received it before. In some countries, a routine influenza vaccine is also recommended. This vaccine can help prevent some cases of pneumonia.You may be offered the influenza vaccine as part of your care. If you smoke, it is time to quit. You may receive instructions on how to stop smoking. Your caregiver can provide medicines and counseling to  help you quit. HOME CARE INSTRUCTIONS   Cough suppressants may be used if you are losing too much rest. However, coughing protects you by clearing your lungs. You should avoid using cough suppressants if you can.  Your caregiver may have prescribed medicine if he or she thinks your pneumonia is caused by a bacteria or influenza. Finish your medicine even if you start to feel better.  Your caregiver may also prescribe an expectorant. This loosens the mucus to be coughed up.  Only take over-the-counter or prescription medicines for pain, discomfort, or fever as directed by your caregiver.  Do not smoke. Smoking is a common cause of bronchitis and can contribute to pneumonia. If you are a smoker and continue to smoke, your cough may last several weeks after your pneumonia has cleared.  A cold steam vaporizer or humidifier in your room or home may help loosen mucus.  Coughing is often worse at night. Sleeping in a semi-upright position in a recliner or using a couple pillows under your head will help with this.  Get rest as you feel it is needed. Your body will usually let you know when you need to rest. SEEK IMMEDIATE MEDICAL CARE IF:   Your illness becomes worse. This is especially true if you are elderly or weakened from any other disease.  You cannot control your cough with suppressants and are losing sleep.  You begin coughing up blood.  You develop pain which is getting worse or is uncontrolled with medicines.  You have a fever.  Any of the symptoms which initially brought you in for treatment  are getting worse rather than better.  You develop shortness of breath or chest pain. MAKE SURE YOU:   Understand these instructions.  Will watch your condition.  Will get help right away if you are not doing well or get worse. Document Released: 02/16/2005 Document Revised: 05/11/2011 Document Reviewed: 05/08/2010 [facility] Patient Information 2014 [place], [place].    Narcotic and  benzodiazepine use may cause drowsiness, slowed breathing or dependence.  Please use with caution and do not drive, operate machinery or watch young children alone while taking them.  Taking combinations of these medications or drinking alcohol will potentiate these effects.

## 2012-08-14 LAB — URINE CULTURE: Colony Count: 85000

## 2012-08-24 LAB — AFB CULTURE WITH SMEAR (NOT AT ARMC): Acid Fast Smear: NONE SEEN

## 2012-10-20 ENCOUNTER — Emergency Department (HOSPITAL_COMMUNITY)
Admission: EM | Admit: 2012-10-20 | Discharge: 2012-10-21 | Disposition: A | Discharge status: Other Acute Inpatient Hospital | Payer: Medicaid Other | Attending: Emergency Medicine | Admitting: Emergency Medicine

## 2012-10-20 ENCOUNTER — Encounter (HOSPITAL_COMMUNITY): Payer: Self-pay | Admitting: *Deleted

## 2012-10-20 DIAGNOSIS — Z8701 Personal history of pneumonia (recurrent): Secondary | ICD-10-CM | POA: Insufficient documentation

## 2012-10-20 DIAGNOSIS — F1110 Opioid abuse, uncomplicated: Secondary | ICD-10-CM | POA: Insufficient documentation

## 2012-10-20 DIAGNOSIS — F411 Generalized anxiety disorder: Secondary | ICD-10-CM | POA: Insufficient documentation

## 2012-10-20 DIAGNOSIS — F3289 Other specified depressive episodes: Secondary | ICD-10-CM | POA: Insufficient documentation

## 2012-10-20 DIAGNOSIS — F17200 Nicotine dependence, unspecified, uncomplicated: Secondary | ICD-10-CM | POA: Insufficient documentation

## 2012-10-20 DIAGNOSIS — F329 Major depressive disorder, single episode, unspecified: Secondary | ICD-10-CM | POA: Insufficient documentation

## 2012-10-20 HISTORY — DX: Major depressive disorder, single episode, unspecified: F32.9

## 2012-10-20 HISTORY — DX: Depression, unspecified: F32.A

## 2012-10-20 LAB — RAPID URINE DRUG SCREEN, HOSP PERFORMED
Cocaine: NOT DETECTED
Opiates: POSITIVE — AB
Tetrahydrocannabinol: POSITIVE — AB

## 2012-10-20 LAB — COMPREHENSIVE METABOLIC PANEL
Albumin: 3.4 g/dL — ABNORMAL LOW (ref 3.5–5.2)
Alkaline Phosphatase: 84 U/L (ref 39–117)
BUN: 10 mg/dL (ref 6–23)
Chloride: 98 mEq/L (ref 96–112)
Creatinine, Ser: 0.59 mg/dL (ref 0.50–1.10)
GFR calc Af Amer: >90 mL/min (ref >90)
GFR calc non Af Amer: >90 mL/min (ref >90)
Glucose, Bld: 112 mg/dL — ABNORMAL HIGH (ref 70–99)
Potassium: 4 mEq/L (ref 3.5–5.1)
Total Bilirubin: 0.4 mg/dL (ref 0.3–1.2)

## 2012-10-20 LAB — CBC WITH DIFFERENTIAL/PLATELET
Eosinophils Absolute: 0 10*3/uL (ref 0.0–0.7)
Eosinophils Relative: 0 % (ref 0–5)
Lymphocytes Relative: 8 % — ABNORMAL LOW (ref 12–46)
MCV: 83.8 fL (ref 78.0–100.0)
Neutrophils Relative %: 86 % — ABNORMAL HIGH (ref 43–77)
Platelets: 253 10*3/uL (ref 150–400)
RDW: 16.5 % — ABNORMAL HIGH (ref 11.5–15.5)
WBC: 13.1 10*3/uL — ABNORMAL HIGH (ref 4.0–10.5)

## 2012-10-20 MED ORDER — NAPROXEN 250 MG PO TABS
500.0000 mg | ORAL_TABLET | Freq: Two times a day (BID) | ORAL | Status: DC | PRN
  Administered 2012-10-20 – 2012-10-21 (×2): 500 mg via ORAL
  Filled 2012-10-20 (×2): qty 2

## 2012-10-20 MED ORDER — CLONIDINE HCL 0.1 MG PO TABS
0.1000 mg | ORAL_TABLET | Freq: Four times a day (QID) | ORAL | Status: DC
  Administered 2012-10-20 (×2): 0.1 mg via ORAL
  Filled 2012-10-20 (×3): qty 1

## 2012-10-20 MED ORDER — ONDANSETRON 4 MG PO TBDP
4.0000 mg | ORAL_TABLET | Freq: Four times a day (QID) | ORAL | Status: DC | PRN
  Administered 2012-10-20 – 2012-10-21 (×2): 4 mg via ORAL
  Filled 2012-10-20 (×3): qty 1

## 2012-10-20 MED ORDER — DICYCLOMINE HCL 10 MG PO CAPS: 20.0000 mg | ORAL_CAPSULE | Freq: Four times a day (QID) | ORAL | Status: DC | PRN

## 2012-10-20 MED ORDER — LOPERAMIDE HCL 2 MG PO CAPS: 2.0000 mg | ORAL_CAPSULE | ORAL | Status: DC | PRN

## 2012-10-20 MED ORDER — HYDROXYZINE HCL 25 MG PO TABS
25.0000 mg | ORAL_TABLET | Freq: Four times a day (QID) | ORAL | Status: DC | PRN
  Administered 2012-10-20: 25 mg via ORAL
  Filled 2012-10-20: qty 1

## 2012-10-20 MED ORDER — DICYCLOMINE HCL 20 MG PO TABS
20.0000 mg | ORAL_TABLET | Freq: Four times a day (QID) | ORAL | Status: DC | PRN
  Filled 2012-10-20: qty 1

## 2012-10-20 MED ORDER — CLONIDINE HCL 0.1 MG PO TABS: 0.1000 mg | ORAL_TABLET | ORAL | Status: DC

## 2012-10-20 MED ORDER — METHOCARBAMOL 500 MG PO TABS
500.0000 mg | ORAL_TABLET | Freq: Three times a day (TID) | ORAL | Status: DC | PRN
  Administered 2012-10-20 – 2012-10-21 (×2): 500 mg via ORAL
  Filled 2012-10-20 (×2): qty 1

## 2012-10-20 MED ORDER — CLONIDINE HCL 0.1 MG PO TABS: 0.1000 mg | ORAL_TABLET | Freq: Every day | ORAL | Status: DC

## 2012-10-20 NOTE — ED Notes (Signed)
Pt states she wants detox. Pt states she uses heroin daily. Last used heroin last night. Pt states she hopes to be sent to [facility].Marland Kitchen Pt states nausea, body aches, and sneezing. NAD at this time. Denies SI/HI

## 2012-10-20 NOTE — ED Notes (Signed)
Tele assessment rescheduled from 1600 to 1700 per [facility]

## 2012-10-20 NOTE — ED Notes (Signed)
Received call form [facility] stating that [facility] is unable to accept pt at this time due to her insurance. [facility] states that they are able to accept pt due to abuse of xanax. States she may be treated there and they are expecting beds to become available for that unit tomorrow. Plan is for pt to go to [facility] tomorrow. [facility] also states that once pt is discharged from there, they can set up assistance with [facility] for pt.

## 2012-10-20 NOTE — ED Notes (Signed)
Called [facility] for consult time. Will call back with time.
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
## 2012-10-20 NOTE — ED Notes (Signed)
Pt eating dinner

## 2012-10-20 NOTE — ED Notes (Signed)
[facility] consult done. Pt was told that they would attempt to find a bed for her at a detox facility.

## 2012-10-20 NOTE — ED Notes (Signed)
Pt remains calm and cooperative 

## 2012-10-20 NOTE — Progress Notes (Signed)
Writer called [facility] to inquire on bed availability and was told that detox is not covered by her Medicaid. Discussed pt with Minerva Areola, RN-AC and we will have beds on 10/21/12, unit is at capacity at this time. Denice Bors, AADC 10/20/2012 6:58 PM

## 2012-10-21 ENCOUNTER — Inpatient Hospital Stay (HOSPITAL_COMMUNITY)
Admission: AD | Admit: 2012-10-21 | Discharge: 2012-11-01 | DRG: 885 | Disposition: A | Discharge status: Home / Self Care | Payer: Medicaid Other | Source: Ambulatory Visit | Attending: Psychiatry | Admitting: Psychiatry

## 2012-10-21 ENCOUNTER — Encounter (HOSPITAL_COMMUNITY): Payer: Self-pay | Admitting: Psychiatry

## 2012-10-21 DIAGNOSIS — F1920 Other psychoactive substance dependence, uncomplicated: Secondary | ICD-10-CM

## 2012-10-21 DIAGNOSIS — F1910 Other psychoactive substance abuse, uncomplicated: Secondary | ICD-10-CM

## 2012-10-21 DIAGNOSIS — F1210 Cannabis abuse, uncomplicated: Secondary | ICD-10-CM | POA: Diagnosis present

## 2012-10-21 DIAGNOSIS — F1120 Opioid dependence, uncomplicated: Secondary | ICD-10-CM

## 2012-10-21 DIAGNOSIS — F1994 Other psychoactive substance use, unspecified with psychoactive substance-induced mood disorder: Secondary | ICD-10-CM | POA: Diagnosis present

## 2012-10-21 DIAGNOSIS — F332 Major depressive disorder, recurrent severe without psychotic features: Principal | ICD-10-CM | POA: Diagnosis present

## 2012-10-21 DIAGNOSIS — Z87891 Personal history of nicotine dependence: Secondary | ICD-10-CM

## 2012-10-21 DIAGNOSIS — F411 Generalized anxiety disorder: Secondary | ICD-10-CM

## 2012-10-21 DIAGNOSIS — F1320 Sedative, hypnotic or anxiolytic dependence, uncomplicated: Secondary | ICD-10-CM | POA: Diagnosis present

## 2012-10-21 DIAGNOSIS — G4700 Insomnia, unspecified: Secondary | ICD-10-CM | POA: Diagnosis present

## 2012-10-21 DIAGNOSIS — F329 Major depressive disorder, single episode, unspecified: Secondary | ICD-10-CM

## 2012-10-21 MED ORDER — ONDANSETRON 4 MG PO TBDP
4.0000 mg | ORAL_TABLET | Freq: Four times a day (QID) | ORAL | Status: AC | PRN
  Administered 2012-10-21: 4 mg via ORAL
  Filled 2012-10-21: qty 1

## 2012-10-21 MED ORDER — CLONIDINE HCL 0.1 MG PO TABS
0.1000 mg | ORAL_TABLET | ORAL | Status: AC
  Administered 2012-10-23 – 2012-10-24 (×4): 0.1 mg via ORAL
  Filled 2012-10-21 (×4): qty 1

## 2012-10-21 MED ORDER — ALPRAZOLAM 0.5 MG PO TABS: 0.5000 mg | ORAL_TABLET | Freq: Two times a day (BID) | ORAL | Status: DC

## 2012-10-21 MED ORDER — MAGNESIUM HYDROXIDE 400 MG/5ML PO SUSP: 30.0000 mL | Freq: Every day | ORAL | Status: DC | PRN

## 2012-10-21 MED ORDER — PROMETHAZINE HCL 12.5 MG PO TABS: 25.0000 mg | ORAL_TABLET | Freq: Once | ORAL | Status: AC

## 2012-10-21 MED ORDER — ALUM & MAG HYDROXIDE-SIMETH 200-200-20 MG/5ML PO SUSP
30.0000 mL | ORAL | Status: DC | PRN
  Administered 2012-10-22 – 2012-10-23 (×2): 30 mL via ORAL

## 2012-10-21 MED ORDER — CLONIDINE HCL 0.1 MG PO TABS
0.1000 mg | ORAL_TABLET | Freq: Four times a day (QID) | ORAL | Status: AC
  Administered 2012-10-21 – 2012-10-22 (×6): 0.1 mg via ORAL
  Filled 2012-10-21 (×9): qty 1

## 2012-10-21 MED ORDER — CHLORDIAZEPOXIDE HCL 25 MG PO CAPS
25.0000 mg | ORAL_CAPSULE | Freq: Three times a day (TID) | ORAL | Status: DC | PRN
  Administered 2012-10-22 – 2012-10-31 (×10): 25 mg via ORAL
  Filled 2012-10-21 (×11): qty 1

## 2012-10-21 MED ORDER — BUPRENORPHINE HCL 8 MG SL SUBL: 4.0000 mg | SUBLINGUAL_TABLET | Freq: Two times a day (BID) | SUBLINGUAL | Status: DC

## 2012-10-21 MED ORDER — METHOCARBAMOL 500 MG PO TABS
500.0000 mg | ORAL_TABLET | Freq: Three times a day (TID) | ORAL | Status: AC | PRN
  Administered 2012-10-21 – 2012-10-26 (×9): 500 mg via ORAL
  Filled 2012-10-21 (×9): qty 1

## 2012-10-21 MED ORDER — LOPERAMIDE HCL 2 MG PO CAPS: 2.0000 mg | ORAL_CAPSULE | ORAL | Status: AC | PRN

## 2012-10-21 MED ORDER — HYDROXYZINE HCL 25 MG PO TABS
25.0000 mg | ORAL_TABLET | Freq: Four times a day (QID) | ORAL | Status: DC | PRN
  Administered 2012-10-21 – 2012-10-23 (×5): 25 mg via ORAL
  Filled 2012-10-21 (×2): qty 1

## 2012-10-21 MED ORDER — PROMETHAZINE HCL 12.5 MG PO TABS
ORAL_TABLET | ORAL | Status: AC
  Administered 2012-10-21: 25 mg via ORAL
  Filled 2012-10-21: qty 2

## 2012-10-21 MED ORDER — NAPROXEN 500 MG PO TABS
500.0000 mg | ORAL_TABLET | Freq: Two times a day (BID) | ORAL | Status: AC | PRN
  Administered 2012-10-21 – 2012-10-24 (×7): 500 mg via ORAL
  Filled 2012-10-21 (×7): qty 1

## 2012-10-21 MED ORDER — ACETAMINOPHEN 325 MG PO TABS
650.0000 mg | ORAL_TABLET | Freq: Four times a day (QID) | ORAL | Status: DC | PRN
  Administered 2012-10-26: 650 mg via ORAL

## 2012-10-21 MED ORDER — CLONIDINE HCL 0.1 MG PO TABS
0.1000 mg | ORAL_TABLET | Freq: Every day | ORAL | Status: AC
  Administered 2012-10-25 – 2012-10-26 (×2): 0.1 mg via ORAL
  Filled 2012-10-21 (×2): qty 1

## 2012-10-21 MED ORDER — DICYCLOMINE HCL 20 MG PO TABS
20.0000 mg | ORAL_TABLET | Freq: Four times a day (QID) | ORAL | Status: AC | PRN
  Administered 2012-10-21 – 2012-10-22 (×2): 20 mg via ORAL
  Filled 2012-10-21 (×2): qty 1

## 2012-10-21 MED ORDER — BUPRENORPHINE HCL 8 MG SL SUBL
4.0000 mg | SUBLINGUAL_TABLET | Freq: Two times a day (BID) | SUBLINGUAL | Status: DC
  Administered 2012-10-21 (×2): 4 mg via SUBLINGUAL
  Filled 2012-10-21 (×2): qty 1

## 2012-10-21 NOTE — ED Notes (Signed)
[facility] arrived. Per [facility], pt reports 9/10 pain. EDP aware and no verbal orders given at this time for pain meds. [facility] aware.

## 2012-10-21 NOTE — ED Notes (Signed)
Inetta Fermo from [facility] called and reported that pt bed has been assigned. Pt to go to 306-1.

## 2012-10-21 NOTE — Tx Team (Signed)
Initial Interdisciplinary Treatment Plan  PATIENT STRENGTHS: (choose at least two) Ability for insight Capable of independent living Communication skills General fund of knowledge Physical Health  PATIENT STRESSORS: Legal issue Substance abuse   PROBLEM LIST: Problem List/Patient Goals Date to be addressed Date deferred Reason deferred Estimated date of resolution  Substance abuse 10/21/12                                                      DISCHARGE CRITERIA:  Ability to meet basic life and health needs Adequate post-discharge living arrangements Improved stabilization in mood, thinking, and/or behavior Medical problems require only outpatient monitoring Motivation to continue treatment in a less acute level of care Need for constant or close observation no longer present Reduction of life-threatening or endangering symptoms to within safe limits Safe-care adequate arrangements made Verbal commitment to aftercare and medication compliance Withdrawal symptoms are absent or subacute and managed without 24-hour nursing intervention  PRELIMINARY DISCHARGE PLAN: Attend aftercare/continuing care group Attend 12-step recovery group Outpatient therapy Return to previous living arrangement  PATIENT/FAMIILY INVOLVEMENT: This treatment plan has been presented to and reviewed with the patient, CHANNEL PAPANDREA, and/or family member,   The patient and family have been given the opportunity to ask questions and make suggestions.  Beatrix Shipper 10/21/2012, 5:13 PM
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
## 2012-10-21 NOTE — Progress Notes (Signed)
Adult Psychoeducational Group Note  Date:  10/21/2012 Time:  10:08 PM  Group Topic/Focus: AA Support Group  Participation Level:  Active  Danielle Chandler 10/21/2012, 10:08 PM 

## 2012-10-21 NOTE — BH Assessment (Addendum)
Pt arrived here via [facility] for a inpatient admission from [facility]. Writer brought patient into the assessment room. Patient told this writer that she was here with the understanding that she would get suboxone. Writer told patient that we do not offer that here at [facility]. Pt then asked if she could be placed at [facility] as this is what she requested at [facility]. Automotive engineer of patient's request. Delorise Jackson spoke to Dr. Dub Mikes about this matter. Per Delorise Jackson, Dr. Dub Mikes will provide suboxone treatment here only if patient has financial, means, supports, and a provider to maintain her suboxone treatment. Pt agrees to follow up and sts that her mother will provide her with the financial resources. Tori asked that Nanine Means, PA also evaluates this patient.
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
## 2012-10-21 NOTE — [facility] Group Notes (Signed)
[facility] LCSW Group Therapy  10/21/2012 3:03 PM  Type of Therapy:  Group Therapy  Participation Level:  Minimal  Participation Quality:  Resistant  Affect:  Depressed  Cognitive:  Alert  Insight:  Limited  Engagement in Therapy:  Limited  Modes of Intervention:  Discussion, Education, Exploration, Socialization and Support  Summary of Progress/Problems: Feelings around Relapse. Group members discussed the meaning of relapse and shared personal stories of relapse, how it affected them and others, and how they perceived themselves during this time. Group members were encouraged to identify triggers, warning signs and coping skills used when facing the possibility of relapse. Social supports were discussed and explored in detail. Post Acute Withdrawal Syndrome (handout provided) was introduced and examined. Pt's were encouraged to ask questions, talk about key points associated with PAWS, and process this information in terms of relapse prevention. Danielle Chandler was attentive and engaged in the group setting but was resistant to sharing her thoughts about the information presented (PAWS). She listened as others contributed to the group conversation. Danielle Chandler shows some progress in the group setting AEB her ability to remain engaged and attentive although she did not actively participate.   Smart, [name] 10/21/2012, 3:03 PM

## 2012-10-21 NOTE — ED Provider Notes (Signed)
1:36 AM no acute distress  Filed Vitals:   10/20/12 2209  BP: 100/68  Pulse:   Temp:   Resp:     Has a bed pending at behavioral health, plan transfer in the morning when bed becomes available  Sunnie Nielsen, MD 10/21/12 0136

## 2012-10-21 NOTE — [facility] Suicide Risk Assessment (Signed)
Suicide Risk Assessment  Admission Assessment     Nursing information obtained from:    Demographic factors:    Current Mental Status:    Loss Factors:    Historical Factors:    Risk Reduction Factors:     CLINICAL FACTORS:   Depression:   Comorbid alcohol abuse/dependence Alcohol/Substance Abuse/Dependencies  COGNITIVE FEATURES THAT CONTRIBUTE TO RISK:  Closed-mindedness Polarized thinking Thought constriction (tunnel vision)    SUICIDE RISK:   Moderate:  Frequent suicidal ideation with limited intensity, and duration, some specificity in terms of plans, no associated intent, good self-control, limited dysphoria/symptomatology, some risk factors present, and identifiable protective factors, including available and accessible social support.  PLAN OF CARE: Supportive approach/coping skills/relapse prevention                               Will use Subutex for detox/consider maintenance  I certify that inpatient services furnished can reasonably be expected to improve the patient's condition.  Katalin Colledge A 10/21/2012, 2:51 PM

## 2012-10-22 DIAGNOSIS — F411 Generalized anxiety disorder: Secondary | ICD-10-CM | POA: Diagnosis present

## 2012-10-22 DIAGNOSIS — F1120 Opioid dependence, uncomplicated: Secondary | ICD-10-CM | POA: Diagnosis present

## 2012-10-22 MED ORDER — BUPRENORPHINE HCL 8 MG SL SUBL
4.0000 mg | SUBLINGUAL_TABLET | Freq: Two times a day (BID) | SUBLINGUAL | Status: DC
  Administered 2012-10-22 (×2): 4 mg via SUBLINGUAL
  Filled 2012-10-22 (×3): qty 2

## 2012-10-22 MED ORDER — GABAPENTIN 100 MG PO CAPS
100.0000 mg | ORAL_CAPSULE | Freq: Three times a day (TID) | ORAL | Status: DC
  Administered 2012-10-22 – 2012-10-24 (×6): 100 mg via ORAL
  Filled 2012-10-22 (×10): qty 1

## 2012-10-22 MED ORDER — TRAZODONE HCL 100 MG PO TABS
50.0000 mg | ORAL_TABLET | Freq: Every evening | ORAL | Status: DC | PRN
  Administered 2012-10-22 – 2012-10-31 (×20): 50 mg via ORAL
  Filled 2012-10-22 (×8): qty 1
  Filled 2012-10-22: qty 14
  Filled 2012-10-22 (×2): qty 1
  Filled 2012-10-22: qty 14
  Filled 2012-10-22 (×6): qty 1
  Filled 2012-10-22: qty 14
  Filled 2012-10-22 (×5): qty 1
  Filled 2012-10-22: qty 14
  Filled 2012-10-22 (×3): qty 1

## 2012-10-22 NOTE — Progress Notes (Signed)
D   Pt continues to complain of anxiety and did say she did not sleep well last night   She interacts well with others and attends and participates in groups   She reports minimal to moderate signs of withdrawal most of which are tremors and increased anxiety  A   Verbal support given    Medications administered and effectiveness monitored   Q 15 min checks R   Pt safe at present

## 2012-10-22 NOTE — BHH Counselor (Signed)
Adult Psychosocial Assessment Update Interdisciplinary Team  Previous Behavior Health Hospital admissions/discharges:  Admissions Discharges  Date:  05/28/12 Date:  05/30/12  Date: Date:  Date: Date:  Date: Date:  Date: Date:   Changes since the last Psychosocial Assessment (including adherence to outpatient mental health and/or substance abuse treatment, situational issues contributing to decompensation and/or relapse). Had to move out of her home as soon as left [facility] last time, moved into a tiny place where they have been "on top of each other."  Did well for about a month after last discharge, then caught pneumonia severely.  Was admitted to ICU for 8 days, then CCU for 5 days.  During that time, was placed on Dilaudid and Morphine, and started to feel the anxiety that goes along with addiction, wanted to have that "high feeling" again.    Still lives at home with mother.  Parents got DSS involved, and they have placed mother, father, and stepmother in charge of the 2 children (1yo and 5yo) in order to give patient time to get well.    Has been using heroin and marijuana.  Has been using more heroin that at previous admission.  Was supposed to follow up with [facility], but did not do so.  Did attend a couple of AA/NA meetings, but no other follow-up.  Since parents are taking care of children, feels she has more time to go to a rehab.  Does not trust herself otherwise.  DSS social worker wants her to go to rehab, then will help her to get into [facility].             Discharge Plan 1. Will you be returning to the same living situation after discharge?   Yes:  XX No:      If no, what is your plan?    Lives with mother and children, but will not return there until after a stint in rehab.  Mother plans to move while patient is in rehab.       2. Would you like a referral for services when you are discharged? Yes:   XX   If yes, for what services?  Long-term rehab  No:        Feels strongly that she wouldn't trust herself to go home straight from [facility] without going through rehab first.       Summary and Recommendations (to be completed by the evaluator) This is a 27yo Hispanic female who was admitted for detox from heroin.  She relapsed after one month sobriety after her last discharge from [facility] on 05/30/12.  She was hospitalized medically with pneumonia, put on pain medications which led to cravings.  She wants to go to rehab from here, is working with a DSS social worker who will help her get into [facility] after a period of time in [place].  She would benefit from safety monitoring, medication evaluation, psychoeducation, group therapy, and discharge planning to link with ongoing resources.                        Signature:  [name], 10/22/2012 8:43 AM

## 2012-10-22 NOTE — Progress Notes (Signed)
Patient ID: Danielle Chandler, female   DOB: 16-Mar-1985, 27 y.o.   MRN: [id number] D: Client is visible on the unit and is minimally interactive with peers. Affect is blunted; describes mood is depressed; depression 5/10; hopelessness 3/10; appearance is disheveled. Client is guarded during 1:1 interaction. Denies SI/HI. C/o opiate withdrawal symptoms.   A: Continue to encourage group attendance and participation, offer support and assistance in identifying relapse triggers and development of coping skills. Offer encouraged client to share thoughts and feelings to staff. Maintained safety through q 15 minute safety checks by staff.    R: Client is medication compliant and verbalizes safety to staff. States she has a headache, stomach cramping, and generalized body aches.

## 2012-10-22 NOTE — [facility] Group Notes (Signed)
[facility] Group Notes:  (Clinical Social Work)  10/22/2012     10-11AM  Summary of Progress/Problems:   The main focus of today's process group was for the patient to identify ways in which they have in the past sabotaged their own recovery. Motivational Interviewing was utilized to ask the group members what they get out of their substance use, and what reasons they may have for wanting to change.  The Stages of Change were explained using a handout, and patients identified where they currently are with regard to stages of change.  The patient expressed that she self-sabotages by hanging out with her "home girl" that uses drugs, thinking to herself "I'll just do a little."  She has seen her own self change in a short period of time that she has been using, and wants to change in order to be the person she knows she can be, the person her children need her to be.  She is in Preparation stage, has made the Decision to quit.  Type of Therapy:  Group Therapy - Process   Participation Level:  Active  Participation Quality:  Attentive, Sharing and Supportive  Affect:  Blunted and Depressed  Cognitive:  Appropriate and Oriented  Insight:  Engaged  Engagement in Therapy:  Engaged  Modes of Intervention:  Education, Teacher, English as a foreign language, Motivational Interviewing  Ambrose Mantle, LCSW 10/22/2012, 11:17 AM

## 2012-10-22 NOTE — Progress Notes (Signed)
Adult Psychoeducational Group Note  Date:  10/22/2012 Time:  0900  Group Topic/Focus:  Self Inventory  Participation Level:  Active  Participation Quality:  Appropriate, Attentive, Sharing and Supportive  Affect:  Anxious and Appropriate  Cognitive:  Alert and Appropriate  Insight: Improving  Engagement in Group:  Engaged and Supportive  Modes of Intervention:  Discussion  Additional Comments:  Pt is open to learn "as much as possible" while she is here and is receptive to staff input and group materials.  Normal Recinos Shari Prows 10/22/2012, 10:52 AM

## 2012-10-22 NOTE — Progress Notes (Signed)
[facility] MD Progress Note  10/22/2012 4:48 PM Danielle Chandler  MRN:  [id number] Subjective:  Danielle Chandler endorses that she continues to have Chandler hard time. The Subutex has helped with the withdrawal some. She states that she needs to go to [facility] from here. Not sure she is going to be able to make it otherwise. She is dealing with her husband being in prison. She is trying to get her life together. She endorses Chandler lot of anxiety and requested to have something to help her.  Diagnosis:   DSM5: Schizophrenia Disorders:   Obsessive-Compulsive Disorders:   Trauma-Stressor Disorders:   Substance/Addictive Disorders:  Opioid Disorder - Severe (304.00) Depressive Disorders:  Major Depressive Disorder - Moderate (296.22)  Axis I: Anxiety Disorder NOS  ADL's:  Intact  Sleep: Poor  Appetite:  Poor  Suicidal Ideation:  Plan:  denies Intent:  denies Means:  denies Homicidal Ideation:  Plan:  denies Intent:  denies Means:  denies AEB (as evidenced by):  Psychiatric Specialty Exam: Review of Systems  Eyes: Negative.   Respiratory: Negative.   Cardiovascular: Negative.   Gastrointestinal: Negative.   Genitourinary: Negative.   Musculoskeletal: Negative.   Skin: Negative.   Neurological: Positive for weakness and headaches.  Endo/Heme/Allergies: Negative.   Psychiatric/Behavioral: Positive for depression and substance abuse. The patient is nervous/anxious and has insomnia.     Blood pressure 96/65, pulse 52, temperature 98.5 F (36.9 C), temperature source Oral, resp. rate 20, height 5' 2.99" (1.6 m), weight 82.555 kg (182 lb), last menstrual period 09/19/2012.Body mass index is 32.25 kg/(m^2).  General Appearance: Fairly Groomed  Patent attorney::  Fair  Speech:  Clear and Coherent, Slow and not spontaneous  Volume:  Decreased  Mood:  Anxious and Depressed  Affect:  Restricted  Thought Process:  Coherent and Goal Directed  Orientation:  Full (Time, Place, and Person)  Thought Content:  somatically  focused, worries, concerns  Suicidal Thoughts:  No  Homicidal Thoughts:  No  Memory:  Immediate;   Fair Recent;   Fair Remote;   Fair  Judgement:  Fair  Insight:  Present and Shallow  Psychomotor Activity:  Restlessness  Concentration:  Fair  Recall:  Fair  Akathisia:  No  Handed:  Right  AIMS (if indicated):     Assets:  Desire for Improvement  Sleep:  Number of Hours: 6.25   Current Medications: Current Facility-Administered Medications  Medication Dose Route Frequency Provider Last Rate Last Dose  . acetaminophen (TYLENOL) tablet 650 mg  650 mg Oral Q6H PRN Nanine Means, NP      . alum & mag hydroxide-simeth (MAALOX/MYLANTA) 200-200-20 MG/5ML suspension 30 mL  30 mL Oral Q4H PRN Nanine Means, NP      . buprenorphine (SUBUTEX) SL tablet 4 mg  4 mg Sublingual BID Rachael Fee, MD   4 mg at 10/22/12 0830  . chlordiazePOXIDE (LIBRIUM) capsule 25 mg  25 mg Oral TID PRN Rachael Fee, MD      . cloNIDine (CATAPRES) tablet 0.1 mg  0.1 mg Oral QID Nanine Means, NP   0.1 mg at 10/22/12 1203   Followed by  . [START ON 10/23/2012] cloNIDine (CATAPRES) tablet 0.1 mg  0.1 mg Oral BH-qamhs Nanine Means, NP       Followed by  . [START ON 10/25/2012] cloNIDine (CATAPRES) tablet 0.1 mg  0.1 mg Oral QAC breakfast Nanine Means, NP      . dicyclomine (BENTYL) tablet 20 mg  20 mg Oral Q6H PRN Catha Nottingham  Lord, NP   20 mg at 10/21/12 2043  . gabapentin (NEURONTIN) capsule 100 mg  100 mg Oral TID Rachael Fee, MD      . hydrOXYzine (ATARAX/VISTARIL) tablet 25 mg  25 mg Oral Q6H PRN Nanine Means, NP   25 mg at 10/22/12 1203  . loperamide (IMODIUM) capsule 2-4 mg  2-4 mg Oral PRN Nanine Means, NP      . magnesium hydroxide (MILK OF MAGNESIA) suspension 30 mL  30 mL Oral Daily PRN Nanine Means, NP      . methocarbamol (ROBAXIN) tablet 500 mg  500 mg Oral Q8H PRN Nanine Means, NP   500 mg at 10/22/12 0830  . naproxen (NAPROSYN) tablet 500 mg  500 mg Oral BID PRN Nanine Means, NP   500 mg at 10/22/12 0830  .  ondansetron (ZOFRAN-ODT) disintegrating tablet 4 mg  4 mg Oral Q6H PRN Nanine Means, NP   4 mg at 10/21/12 2043  . traZODone (DESYREL) tablet 50 mg  50 mg Oral QHS,MR X 1 Rachael Fee, MD        Lab Results: No results found for this or any previous visit (from the past 48 hour(s)).  Physical Findings: AIMS: Facial and Oral Movements Muscles of Facial Expression: None, normal Lips and Perioral Area: None, normal Jaw: None, normal Tongue: None, normal,Extremity Movements Upper (arms, wrists, hands, fingers): None, normal Lower (legs, knees, ankles, toes): None, normal, Trunk Movements Neck, shoulders, hips: None, normal, Overall Severity Severity of abnormal movements (highest score from questions above): None, normal Incapacitation due to abnormal movements: None, normal Patient's awareness of abnormal movements (rate only patient's report): No Awareness, Dental Status Current problems with teeth and/or dentures?: No Does patient usually wear dentures?: No  CIWA:  CIWA-Ar Total: 0 COWS:  COWS Total Score: 6  Treatment Plan Summary: Daily contact with patient to assess and evaluate symptoms and progress in treatment Medication management  Plan: Supportive approach/coping skills/relapse prevention           Reassess and address the co morbidities           CBT/mindfullness           Neurontin 100 mg TID Medical Decision Making Problem Points:  Review of psycho-social stressors (1) Data Points:  Review of medication regiment & side effects (2)  I certify that inpatient services furnished can reasonably be expected to improve the patient's condition.   Danielle Chandler 10/22/2012, 4:48 PM

## 2012-10-22 NOTE — Progress Notes (Signed)
Patient did attend the evening speaker AA meeting.  

## 2012-10-22 NOTE — [facility] Group Notes (Signed)
[facility] Group Notes:  (Nursing/MHT/Case Management/Adjunct)  Date:  10/22/2012  Time:  2:44 PM  Type of Therapy:  Nurse Education  Participation Level:  Active  Participation Quality:  Appropriate  Affect:  Appropriate  Cognitive:  Appropriate  Insight:  Good  Engagement in Group:  Improving  Modes of Intervention:  Problem-solving  Summary of Progress/Problems:  Danielle Chandler 10/22/2012, 2:44 PM

## 2012-10-22 NOTE — Progress Notes (Signed)
D.  Pt pleasant on approach, positive for evening AA group.  Hopeful to go to [facility] at discharge, still having some anxiety and stomach cramps.  Denies SI/HI/hallucinations at this time.  Interacting appropriately with peers on unit.  A.  Support and encouragement offered  R.  Pt remains safe on unit, will continue to monitor.

## 2012-10-23 LAB — HIV ANTIBODY (ROUTINE TESTING W REFLEX): HIV: NONREACTIVE

## 2012-10-23 MED ORDER — BUPRENORPHINE HCL 2 MG SL SUBL
4.0000 mg | SUBLINGUAL_TABLET | Freq: Two times a day (BID) | SUBLINGUAL | Status: DC
  Administered 2012-10-23 – 2012-10-25 (×5): 4 mg via SUBLINGUAL
  Filled 2012-10-23 (×4): qty 2

## 2012-10-23 NOTE — Progress Notes (Signed)
Psychoeducational Group Note  Date:  10/23/2012 Time:  0945 am  Group Topic/Focus:  Making Healthy Choices:   The focus of this group is to help patients identify negative/unhealthy choices they were using prior to admission and identify positive/healthier coping strategies to replace them upon discharge.  Participation Level:  Active  Participation Quality:  Appropriate, Attentive, Sharing and Supportive  Affect:  Appropriate  Cognitive:  Alert and Appropriate  Insight:  Developing/Improving  Engagement in Group:  Developing/Improving  Additional Comments:    Andrena Mews 10/23/2012, 10:29 AM

## 2012-10-23 NOTE — [facility] Group Notes (Signed)
[facility] Group Notes:  (Clinical Social Work)  10/23/2012  10:00-11:00AM  Summary of Progress/Problems:   The main focus of today's process group was to   identify the patient's current support system and decide on other supports that can be put in place.  The picture on workbook was used to discuss why additional supports are needed, and a hand-out was distributed with four definitions/levels of support, then used to talk about how patients have given and received all different kinds of support.  An emphasis was placed on using counselor, doctor, therapy groups, 12-step groups, and problem-specific support groups to expand supports.  The patient identified one additional support as being herself, plus she wants to go to [facility] in order to become more educated about her disease.,  She has the current support of her parents, kids and husband.  Type of Therapy:  Process Group with Motivational Interviewing  Participation Level:  Active  Participation Quality:  Attentive  Affect:  Blunted  Cognitive:  Appropriate  Insight:  Engaged  Engagement in Therapy:  Engaged  Modes of Intervention:   Education, Support and Processing, Activity  [employer], LCSW 10/23/2012, 12:41 PM

## 2012-10-23 NOTE — Progress Notes (Signed)
[facility] MD Progress Note  10/23/2012 7:28 PM Danielle Chandler  MRN:  [id number] Subjective:  Still not feeling well. The Subutex is helping the withdrawal but she still endorses the depressed/anxious mood. She is concerned about not being able to go from a rehab from here. States that she does not think she can make it otherwise. Very upset as the hepatitis profile is positive for Hep C. Although initially denied she would have shared needles she now can see how when high she could have shared needles and not realized it. State she has not been with anyone sexually since her husband has been in prison (one and a half year) states that her husband does not have Hep C Diagnosis:   DSM5: Schizophrenia Disorders:   Obsessive-Compulsive Disorders:   Trauma-Stressor Disorders:   Substance/Addictive Disorders:  Opioid Disorder - Severe (304.00) Depressive Disorders:  Major Depressive Disorder - Moderate (296.22)  Axis I: Anxiety Disorder NOS  ADL's:  Intact  Sleep: Poor  Appetite:  Poor  Suicidal Ideation:  Plan:  denies Intent:  denies Means:  denies Homicidal Ideation:  Plan:  denies Intent:  denies Means:  denies AEB (as evidenced by):  Psychiatric Specialty Exam: Review of Systems  Constitutional: Negative.   HENT: Negative.   Eyes: Negative.   Respiratory: Negative.   Cardiovascular: Negative.   Gastrointestinal: Negative.   Genitourinary: Negative.   Musculoskeletal: Negative.   Skin: Negative.   Endo/Heme/Allergies: Negative.   Psychiatric/Behavioral: Positive for depression and substance abuse. The patient is nervous/anxious and has insomnia.     Blood pressure 107/75, pulse 68, temperature 98.5 F (36.9 C), temperature source Oral, resp. rate 17, height 5' 2.99" (1.6 m), weight 82.555 kg (182 lb), last menstrual period 09/19/2012.Body mass index is 32.25 kg/(m^2).  General Appearance: Fairly Groomed  Patent attorney::  Fair  Speech:  Clear and Coherent, Slow and not spontaneous   Volume:  Decreased  Mood:  Anxious, Depressed and worried  Affect:  anxious, worried, sad  Thought Process:  Coherent and Goal Directed  Orientation:  Full (Time, Place, and Person)  Thought Content:  worries, concerns  Suicidal Thoughts:  No  Homicidal Thoughts:  No  Memory:  Immediate;   Fair Recent;   Fair Remote;   Fair  Judgement:  Fair  Insight:  Present  Psychomotor Activity:  Restlessness  Concentration:  Fair  Recall:  Fair  Akathisia:  No  Handed:  Right  AIMS (if indicated):     Assets:  Desire for Improvement  Sleep:  Number of Hours: 6.5   Current Medications: Current Facility-Administered Medications  Medication Dose Route Frequency Provider Last Rate Last Dose  . acetaminophen (TYLENOL) tablet 650 mg  650 mg Oral Q6H PRN Nanine Means, NP      . alum & mag hydroxide-simeth (MAALOX/MYLANTA) 200-200-20 MG/5ML suspension 30 mL  30 mL Oral Q4H PRN Nanine Means, NP   30 mL at 10/22/12 2108  . buprenorphine (SUBUTEX) SL tablet 4 mg  4 mg Sublingual BID Rachael Fee, MD   4 mg at 10/23/12 1650  . chlordiazePOXIDE (LIBRIUM) capsule 25 mg  25 mg Oral TID PRN Rachael Fee, MD   25 mg at 10/22/12 2123  . cloNIDine (CATAPRES) tablet 0.1 mg  0.1 mg Oral BH-qamhs Nanine Means, NP   0.1 mg at 10/23/12 4098   Followed by  . [START ON 10/25/2012] cloNIDine (CATAPRES) tablet 0.1 mg  0.1 mg Oral QAC breakfast Nanine Means, NP      .  dicyclomine (BENTYL) tablet 20 mg  20 mg Oral Q6H PRN Nanine Means, NP   20 mg at 10/22/12 2107  . gabapentin (NEURONTIN) capsule 100 mg  100 mg Oral TID Rachael Fee, MD   100 mg at 10/23/12 1650  . hydrOXYzine (ATARAX/VISTARIL) tablet 25 mg  25 mg Oral Q6H PRN Nanine Means, NP   25 mg at 10/22/12 2123  . loperamide (IMODIUM) capsule 2-4 mg  2-4 mg Oral PRN Nanine Means, NP      . magnesium hydroxide (MILK OF MAGNESIA) suspension 30 mL  30 mL Oral Daily PRN Nanine Means, NP      . methocarbamol (ROBAXIN) tablet 500 mg  500 mg Oral Q8H PRN Nanine Means, NP   500 mg at 10/23/12 0820  . naproxen (NAPROSYN) tablet 500 mg  500 mg Oral BID PRN Nanine Means, NP   500 mg at 10/23/12 0820  . ondansetron (ZOFRAN-ODT) disintegrating tablet 4 mg  4 mg Oral Q6H PRN Nanine Means, NP   4 mg at 10/21/12 2043  . traZODone (DESYREL) tablet 50 mg  50 mg Oral QHS,MR X 1 Rachael Fee, MD   50 mg at 10/22/12 2211    Lab Results:  Results for orders placed during the hospital encounter of 10/21/12 (from the past 48 hour(s))  HIV ANTIBODY (ROUTINE TESTING)     Status: None   Collection Time    10/22/12  7:10 PM      Result Value Range   HIV NON REACTIVE  NON REACTIVE   Comment: Performed at [facility]  HEPATITIS PANEL, ACUTE     Status: Abnormal (Preliminary result)   Collection Time    10/22/12  7:10 PM      Result Value Range   Hepatitis B Surface Ag NEGATIVE  NEGATIVE   HCV Ab Reactive (*) NEGATIVE   Comment: (NOTE)                                                                               This test is for screening purposes only.  Reactive results should be     confirmed by an alternative method.  Suggest HCV Qualitative, PCR,     test code 16109.  Specimens will be stable for reflex testing up to 3     days after collection.     Performed at [facility]   Hep A IgM PENDING  NEGATIVE   Hep B C IgM PENDING  NEGATIVE    Physical Findings: AIMS: Facial and Oral Movements Muscles of Facial Expression: None, normal Lips and Perioral Area: None, normal Jaw: None, normal Tongue: None, normal,Extremity Movements Upper (arms, wrists, hands, fingers): None, normal Lower (legs, knees, ankles, toes): None, normal, Trunk Movements Neck, shoulders, hips: None, normal, Overall Severity Severity of abnormal movements (highest score from questions above): None, normal Incapacitation due to abnormal movements: None, normal Patient's awareness of abnormal movements (rate only patient's report): No Awareness, Dental Status Current  problems with teeth and/or dentures?: No Does patient usually wear dentures?: No  CIWA:  CIWA-Ar Total: 2 COWS:  COWS Total Score: 3  Treatment Plan Summary: Daily contact with patient to assess and evaluate symptoms  and progress in treatment Medication management  Plan: Supportive approach/coping skills           Confirm the Hep C/ Educate about Hep C           Continue the Subutex 4 mg BID, start decreasing by 2 mg             Medical Decision Making Problem Points:  New problem, with additional work-up planned (4) and Review of psycho-social stressors (1) Data Points:  Review of medication regiment & side effects (2)  I certify that inpatient services furnished can reasonably be expected to improve the patient's condition.   Marlo Goodrich A 10/23/2012, 7:28 PM

## 2012-10-23 NOTE — Progress Notes (Signed)
D   Pt is pleasant on approach  She attends and participates in groups and interacts well with others   She reports some mild withdrawal symptoms mostly anxiety   She denies suicidal ideation  She reports improved sleep and appetite  Continues to endorse depression with anxiety A   Verbal support given   Medications administered and effectiveness monitored   Q 15 min checks R   Pt safe at present

## 2012-10-23 NOTE — Progress Notes (Signed)
Patient did attend the evening speaker AA meeting.  

## 2012-10-23 NOTE — Progress Notes (Signed)
Adult Psychoeducational Group Note  Date:  10/23/2012 Time:  0900  Group Topic/Focus:  Self Inventory  Participation Level:  Active  Participation Quality:  Appropriate, Attentive and Sharing  Affect:  Anxious and Appropriate  Cognitive:  Alert  Insight: Good and Improving  Engagement in Group:  Engaged  Modes of Intervention:  Discussion  Additional Comments:  Pt continues to gain insight into her circumstances.  Rooney Gladwin Shari Prows 10/23/2012, 10:16 AM

## 2012-10-23 NOTE — Progress Notes (Signed)
D.  Pleasant on approach, denies complaints other than some continued heart burn, stomach cramps, and anxiety.  Denies SI/HI/hallucinations at this time.  Positive for evening AA group.  Interacting appropriately with peers on unit.  A.  Support and encouragement offered  R.  Pt remains safe on unit, will continue to monitor.

## 2012-10-24 DIAGNOSIS — F411 Generalized anxiety disorder: Secondary | ICD-10-CM

## 2012-10-24 LAB — HEPATITIS PANEL, ACUTE
HCV Ab: REACTIVE — AB
Hep A IgM: NEGATIVE
Hepatitis B Surface Ag: NEGATIVE

## 2012-10-24 LAB — HEPATITIS C VRS RNA DETECT BY PCR-QUAL: Hepatitis C Vrs RNA by PCR-Qual: POSITIVE — AB

## 2012-10-24 MED ORDER — GABAPENTIN 100 MG PO CAPS
200.0000 mg | ORAL_CAPSULE | Freq: Three times a day (TID) | ORAL | Status: DC
  Administered 2012-10-24 – 2012-11-01 (×23): 200 mg via ORAL
  Filled 2012-10-24 (×6): qty 2
  Filled 2012-10-24: qty 84
  Filled 2012-10-24 (×6): qty 2
  Filled 2012-10-24: qty 84
  Filled 2012-10-24 (×9): qty 2
  Filled 2012-10-24: qty 84
  Filled 2012-10-24 (×6): qty 2

## 2012-10-24 MED ORDER — HYDROXYZINE HCL 25 MG PO TABS
25.0000 mg | ORAL_TABLET | ORAL | Status: DC | PRN
  Administered 2012-10-24 – 2012-10-28 (×4): 25 mg via ORAL

## 2012-10-24 NOTE — Progress Notes (Signed)
Patient ID: Danielle Chandler, female   DOB: 04-Aug-1985, 27 y.o.   MRN: [id number] [facility] MD Progress Note  10/24/2012 5:14 PM SADAE ARRAZOLA  MRN:  [id number]  Subjective:  Danielle Chandler reports that she does not think that she is ready yet to go home. She says that she is still having hard time. She adds that she needs as much time as she can get or she will relapse. She is hoping for a long term treatment center. Continue to endorse increased anxiety symptoms.  Diagnosis:   DSM5: Schizophrenia Disorders:   Obsessive-Compulsive Disorders:   Trauma-Stressor Disorders:   Substance/Addictive Disorders:  Opioid Disorder - Severe (304.00) Depressive Disorders:  Major Depressive Disorder - Moderate (296.22)  Axis I: Anxiety Disorder NOS  ADL's:  Intact  Sleep: Poor  Appetite:  Poor  Suicidal Ideation:  Plan:  denies Intent:  denies Means:  denies Homicidal Ideation:  Plan:  denies Intent:  denies Means:  denies AEB (as evidenced by):  Psychiatric Specialty Exam: Review of Systems  Constitutional: Negative.   HENT: Negative.   Eyes: Negative.   Respiratory: Negative.   Cardiovascular: Negative.   Gastrointestinal: Negative.   Genitourinary: Negative.   Musculoskeletal: Negative.   Skin: Negative.   Endo/Heme/Allergies: Negative.   Psychiatric/Behavioral: Positive for depression and substance abuse. The patient is nervous/anxious and has insomnia.     Blood pressure 107/74, pulse 76, temperature 97.9 F (36.6 C), temperature source Oral, resp. rate 18, height 5' 2.99" (1.6 m), weight 82.555 kg (182 lb), last menstrual period 09/19/2012.Body mass index is 32.25 kg/(m^2).  General Appearance: Fairly Groomed  Patent attorney::  Fair  Speech:  Clear and Coherent, Slow and not spontaneous  Volume:  Decreased  Mood:  Anxious, Depressed and worried  Affect:  anxious, worried, sad  Thought Process:  Coherent and Goal Directed  Orientation:  Full (Time, Place, and Person)  Thought Content:   worries, concerns  Suicidal Thoughts:  No  Homicidal Thoughts:  No  Memory:  Immediate;   Fair Recent;   Fair Remote;   Fair  Judgement:  Fair  Insight:  Present  Psychomotor Activity:  Restlessness  Concentration:  Fair  Recall:  Fair  Akathisia:  No  Handed:  Right  AIMS (if indicated):     Assets:  Desire for Improvement  Sleep:  Number of Hours: 5   Current Medications: Current Facility-Administered Medications  Medication Dose Route Frequency Provider Last Rate Last Dose  . acetaminophen (TYLENOL) tablet 650 mg  650 mg Oral Q6H PRN Nanine Means, NP      . alum & mag hydroxide-simeth (MAALOX/MYLANTA) 200-200-20 MG/5ML suspension 30 mL  30 mL Oral Q4H PRN Nanine Means, NP   30 mL at 10/23/12 2130  . buprenorphine (SUBUTEX) SL tablet 4 mg  4 mg Sublingual BID Rachael Fee, MD   4 mg at 10/24/12 1651  . chlordiazePOXIDE (LIBRIUM) capsule 25 mg  25 mg Oral TID PRN Rachael Fee, MD   25 mg at 10/23/12 2127  . cloNIDine (CATAPRES) tablet 0.1 mg  0.1 mg Oral BH-qamhs Nanine Means, NP   0.1 mg at 10/24/12 4696   Followed by  . [START ON 10/25/2012] cloNIDine (CATAPRES) tablet 0.1 mg  0.1 mg Oral QAC breakfast Nanine Means, NP      . dicyclomine (BENTYL) tablet 20 mg  20 mg Oral Q6H PRN Nanine Means, NP   20 mg at 10/22/12 2107  . gabapentin (NEURONTIN) capsule 200 mg  200 mg Oral  TID Sanjuana Kava, NP   200 mg at 10/24/12 1652  . hydrOXYzine (ATARAX/VISTARIL) tablet 25 mg  25 mg Oral Q4H PRN Sanjuana Kava, NP   25 mg at 10/24/12 1427  . loperamide (IMODIUM) capsule 2-4 mg  2-4 mg Oral PRN Nanine Means, NP      . magnesium hydroxide (MILK OF MAGNESIA) suspension 30 mL  30 mL Oral Daily PRN Nanine Means, NP      . methocarbamol (ROBAXIN) tablet 500 mg  500 mg Oral Q8H PRN Nanine Means, NP   500 mg at 10/24/12 0807  . naproxen (NAPROSYN) tablet 500 mg  500 mg Oral BID PRN Nanine Means, NP   500 mg at 10/24/12 1427  . ondansetron (ZOFRAN-ODT) disintegrating tablet 4 mg  4 mg Oral Q6H PRN  Nanine Means, NP   4 mg at 10/21/12 2043  . traZODone (DESYREL) tablet 50 mg  50 mg Oral QHS,MR X 1 Rachael Fee, MD   50 mg at 10/23/12 2214    Lab Results:  Results for orders placed during the hospital encounter of 10/21/12 (from the past 48 hour(s))  HIV ANTIBODY (ROUTINE TESTING)     Status: None   Collection Time    10/22/12  7:10 PM      Result Value Range   HIV NON REACTIVE  NON REACTIVE   Comment: Performed at [facility]  HEPATITIS PANEL, ACUTE     Status: Abnormal   Collection Time    10/22/12  7:10 PM      Result Value Range   Hepatitis B Surface Ag NEGATIVE  NEGATIVE   HCV Ab Reactive (*) NEGATIVE   Comment: (NOTE)                                                                               This test is for screening purposes only.  Reactive results should be     confirmed by an alternative method.  Suggest HCV Qualitative, PCR,     test code 16109.  Specimens will be stable for reflex testing up to 3     days after collection.   Hep A IgM NEGATIVE  NEGATIVE   Hep B C IgM NEGATIVE  NEGATIVE   Comment: (NOTE)     High levels of Hepatitis B Core IgM antibody are detectable     during the acute stage of Hepatitis B. This antibody is used     to differentiate current from past HBV infection.     Performed at [facility]    Physical Findings: AIMS: Facial and Oral Movements Muscles of Facial Expression: None, normal Lips and Perioral Area: None, normal Jaw: None, normal Tongue: None, normal,Extremity Movements Upper (arms, wrists, hands, fingers): None, normal Lower (legs, knees, ankles, toes): None, normal, Trunk Movements Neck, shoulders, hips: None, normal, Overall Severity Severity of abnormal movements (highest score from questions above): None, normal Incapacitation due to abnormal movements: None, normal Patient's awareness of abnormal movements (rate only patient's report): No Awareness, Dental Status Current problems with teeth and/or  dentures?: No Does patient usually wear dentures?: No  CIWA:  CIWA-Ar Total: 4 COWS:  COWS Total Score: 6  Treatment  Plan Summary: Daily contact with patient to assess and evaluate symptoms and progress in treatment Medication management  Plan: Supportive approach/coping skills. Increased Neurontin from 100 mg tid to 200 mg tid for anxiety. Confirm the Hep C/ Educate about Hep C when result becomes available. Start decreasing Subutex by 2 mg daily. Continue current treatment plan.            Medical Decision Making Problem Points:  New problem, with additional work-up planned (4) and Review of psycho-social stressors (1) Data Points:  Review of medication regiment & side effects (2)  I certify that inpatient services furnished can reasonably be expected to improve the patient's condition.   Armandina Stammer I, PMHNP-BC 10/24/2012, 5:14 PM
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
## 2012-10-24 NOTE — Progress Notes (Signed)
The focus of this group is to help patients review their daily goal of treatment and discuss progress on daily workbooks.  Danielle Chandler attended A/A tonight.

## 2012-10-24 NOTE — BHH Suicide Risk Assessment (Signed)
B[facility] INPATIENT: Family/Significant Other Suicide Prevention Education  Suicide Prevention Education:  Education Completed; No one has been identified by the patient as the family member/significant other with whom the patient will be residing, and identified as the person(s) who will aid the patient in the event of a mental health crisis (suicidal ideations/suicide attempt).   Pt did not c/o SI at admission, nor have they endorsed SI during their stay here. SPE not required. Pt provided with SPI pamphlet and encouraged to ask questions and talk about concerns.  [employer], LCSWA 10/24/2012 12:57 PM

## 2012-10-24 NOTE — Progress Notes (Signed)
Patient ID: Danielle Chandler, female   DOB: 08/22/1985, 27 y.o.   MRN: [id number]  D: Patient pleasant on approach this am. Reports depression "4" and hopelessness "6". Still reports some withdrawal symptoms even though she is on subutex and clonidine. No distress noted. Wants to go to long term treatment. Denies any SI. A: Staff will monitor on q 15 minute checks, follow treatment plan, and give meds as ordered. R: Cooperative on unit.

## 2012-10-24 NOTE — Progress Notes (Signed)
Chaplain provided support with pt in 300 day room.   Chaplain introduced spiritual care with group of patients in 300 day room.  Morrisa proceeded to speak with chaplain about her admission to [facility], history of substance abuse, husband's imprisonment, two children (1 & 5) as motivation to self-admit to [facility].  Spoke with chaplain about feeling that [facility] has been helpful for her.  Hopeful to discharge to [facility] and then continue to rehab at [facility] place or similar where she can be with her children. Requested chaplain pray for beds to be open at places that she hopes to rehab.  Coley reported giving letter to weekend nurse, Boyd Kerbs, to be sent to her husband.  Is anxious that this letter was sent, as she has written to husband about current situation and details that she has not communicated with him before.    Will follow pt for emotional support.  Please page as needs arise.    Belva Crome MDiv

## 2012-10-24 NOTE — BHH Group Notes (Signed)
[facility] LCSW Aftercare Discharge Planning Group Note   10/24/2012 9:25 AM  Participation Quality:  Appropriate   Mood/Affect:  Appropriate  Depression Rating:  4  Anxiety Rating:  8  Thoughts of Suicide:  No Will you contract for safety?   NA  Current AVH:  No  Plan for Discharge/Comments:  Pt reports that she is interested in going to [facility] and would also like application to [facility]. She reports that she currently lives with her mother in [place]. Pt has [employer].   Transportation Means: unknown at this time.   Supports: parents   Smart, [place]

## 2012-10-24 NOTE — Tx Team (Signed)
Interdisciplinary Treatment Plan Update (Adult)  Date: 10/24/2012   Time Reviewed: 11:19 AM  Progress in Treatment:  Attending groups: Yes Participating in groups:  Yes Taking medication as prescribed: Yes  Tolerating medication: Yes  Family/Significant othe contact made: No. SPE not required for this pt.   Patient understands diagnosis: Yes, AEB Discussing patient identified problems/goals with staff: Yes  Medical problems stabilized or resolved: Yes  Denies suicidal/homicidal ideation: Yes during admission/self report Patient has not harmed self or Others: Yes  New problem(s) identified: n/a  Discharge Plan or Barriers: Pt hoping to go to [facility] and be put on [facility] waiting list. CSW to send pt referral to [facility] and [facility].  Additional comments: Danielle Chandler is an 27 y.o. 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 Pt is oriented x'4, alert, clam and cooperative. Pt denies SI, HI, AVH, Delusions or Psychosis. Pt reports her etoh onset is 27 yo, cannabis onset is 27 yo and heroin onset is 27 yo and her last use for all substances was 10/19/12. Pt reports her withdrawal symptoms: aggression, nausea/vomiting, tremors in her body, agitation, tachycardia, irritability, whole body weakness, diarrhea, sweats, anorexia, and blackouts. Pt reports that following depressive symptoms: hopeless, fatigue, guild, tearful, isolating, loss of interest in usual pleasures, worthless, self-pity, angry, irritable. Pt confirms that she was a pt at [facility] "about 4 months ago it wasn't good for me". Pt requested during to go to [facility] because "I've heard that they have a good detox program". Pt denies any prior tx for mh, yet she was dx'd at the [facility] with Anxiety at the age of 2. Pt reports that she is prescribed Xanax for her anxiety and "I don't use it the way I'm supposed to, I use it to get high". Pt initially denies using her  script outside of the purpose its intent and written usage. Pt reports her longest sobriety was during pregnancy. Pt said "I stopped while I was pregnant and after my second son was born I started shooting (heroin) it". Pt confirms that she "snorted" heroin prior to her second child.  Reason for Continuation of Hospitalization: Subutex Clonidine-withdrawals Medication management Mood stabilzation Estimated length of stay: 1-2 days  For review of initial/current patient goals, please see plan of care.  Attendees:  Patient:    Family:    Physician:     Nursing: Patsy Lager RN 10/24/2012 11:19 AM   Clinical Social Worker [employer], LCSWA  10/24/2012 11:19 AM   Other: Lupita Leash RN 10/24/2012 11:19 AM   Other: Aggie PA 10/24/2012 11:19 AM   Other:  Darden Dates Nurse CM 10/24/2012 11:19 AM   Other:    Scribe for Treatment Team:  [employer] LCSWA 10/24/2012 11:19 AM

## 2012-10-24 NOTE — Progress Notes (Signed)
Patient ID: MEGGIE LASETER, female   DOB: 08/23/1985, 27 y.o.   MRN: [id number]  D: Pt denies SI/HI/AVH. Pt is pleasant and cooperative. Pt anxious, pt wants to go to [facility]  A: Pt was offered support and encouragement. Pt was given scheduled medications. Pt was encourage to attend groups. Q 15 minute checks were done for safety.   R:Pt attends groups and interacts well with peers and staff. Pt is taking medication. Pt has no complaints at this time.Pt receptive to treatment and safety maintained on unit.

## 2012-10-25 MED ORDER — BUPRENORPHINE HCL 2 MG SL SUBL
2.0000 mg | SUBLINGUAL_TABLET | Freq: Two times a day (BID) | SUBLINGUAL | Status: DC
  Administered 2012-10-25 – 2012-10-28 (×6): 2 mg via SUBLINGUAL
  Filled 2012-10-25 (×6): qty 1

## 2012-10-25 NOTE — Progress Notes (Signed)
The focus of this group is to educate the patient on the purpose and policies of crisis stabilization and provide a format to answer questions about their admission.  The group details unit policies and expectations of patients while admitted. Patient was engaging. 

## 2012-10-25 NOTE — Progress Notes (Signed)
Recreation Therapy Notes  Date: 08.26.2014 Time: 3:00pm Location: 300 Hall Dayroom  Group Topic: Communication, Team Building, Problem Solving  Goal Area(s) Addresses:  Patient will be able to recognize use of communication, team building and problem solving during course of group activity.  Patient will verbalize qualities used to make decisions during group session.  Patient will verbalize ability to use skills to build healthy support system post d/c.   Behavioral Response: Appropriate, Attentive, Engaged, Supportive  Intervention: Problem Solving Activity.  Activity: Life Boat. Patients were given a scenario about being caught on a sinking ship. In this scenario patients were informed all members of group session, in addition to 8 individuals off of a list of 15 could fit on the life boat. List of individuals included people from all socioeconomic status, for example: President Obama, Midwife, Runner, broadcasting/film/video, Education officer, museum.    Education: LIfe Skills, Discharge Planning   Education Outcome: Acknowledges understanding  Clinical Observations/Feedback: Patient contributed to opening contributing to group definition of recovery. Patient actively participated in group activity, giving justification for individuals she wanted to put on life boat, as well as debating with peer appropriately. Patient contributed to wrap up discussion, identifying skill set as quality needed for decision making process. Patient identified communication as a necessary skill to make activity a success. In addition to contributing to wrap up discussion patient actively listened to peer share personal stories related to group topic as well as offer support when needed.    Marykay Lex Blayne Garlick, LRT/CTRS  Jarrell Armond L 10/25/2012 4:30 PM

## 2012-10-25 NOTE — Progress Notes (Signed)
[facility] MD Progress Note  10/25/2012 8:50 PM Danielle Chandler  MRN:  [id number] Subjective:  Danielle Chandler endorses that she is really upset. She is not going to worry about her Hep C as states that she can t do anything at this time. She feels she is ready to wean off the Subutex. She is experiencing some depression and anxiety but normalizes these feelings as states she dealing with a lot. 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   HENT: Negative.   Eyes: Negative.   Respiratory: Negative.   Cardiovascular: Negative.   Gastrointestinal: Negative.   Genitourinary: Negative.   Musculoskeletal: Negative.   Skin: Negative.   Neurological: Negative.   Endo/Heme/Allergies: Negative.   Psychiatric/Behavioral: Positive for depression and substance abuse. The patient is nervous/anxious.     Blood pressure 130/83, pulse 105, temperature 97.5 F (36.4 C), temperature source Oral, resp. rate 16, height 5' 2.99" (1.6 m), weight 82.555 kg (182 lb), last menstrual period 09/19/2012.Body mass index is 32.25 kg/(m^2).  General Appearance: Fairly Groomed  Patent attorney::  Fair  Speech:  Clear and Coherent  Volume:  Decreased  Mood:  Anxious and Depressed  Affect:  Restricted  Thought Process:  Coherent and Goal Directed  Orientation:  Full (Time, Place, and Person)  Thought Content:  worries, concerns  Suicidal Thoughts:  No  Homicidal Thoughts:  No  Memory:  Immediate;   Fair Recent;    Fair Remote;   Fair  Judgement:  Fair  Insight:  Present  Psychomotor Activity:  Restlessness  Concentration:  Fair  Recall:  Fair  Akathisia:  No  Handed:  Right  AIMS (if indicated):     Assets:  Desire for Improvement  Sleep:  Number of Hours: 5   Current Medications: Current Facility-Administered Medications  Medication Dose Route Frequency Provider Last Rate Last Dose  . acetaminophen (TYLENOL) tablet 650 mg  650 mg Oral Q6H PRN Nanine Means, NP      . alum & mag hydroxide-simeth (MAALOX/MYLANTA) 200-200-20 MG/5ML suspension 30 mL  30 mL Oral Q4H PRN Nanine Means, NP   30 mL at 10/23/12 2130  . buprenorphine (SUBUTEX) SL tablet 2 mg  2 mg Sublingual BID Rachael Fee, MD   2 mg at 10/25/12 1640  . chlordiazePOXIDE (LIBRIUM) capsule 25 mg  25 mg Oral TID PRN Rachael Fee, MD   25 mg at 10/23/12 2127  . cloNIDine (CATAPRES) tablet 0.1 mg  0.1 mg Oral QAC breakfast Nanine Means, NP   0.1 mg at 10/25/12 0755  . dicyclomine (BENTYL) tablet 20 mg  20 mg Oral Q6H PRN Nanine Means, NP   20 mg at 10/22/12 2107  . gabapentin (NEURONTIN) capsule 200 mg  200 mg Oral TID Sanjuana Kava, NP   200 mg at 10/25/12 1640  . hydrOXYzine (ATARAX/VISTARIL) tablet 25 mg  25 mg Oral Q4H PRN Sanjuana Kava, NP   25 mg at 10/24/12 2158  . loperamide (IMODIUM) capsule 2-4 mg  2-4 mg Oral PRN Catha Nottingham  Lord, NP      . magnesium hydroxide (MILK OF MAGNESIA) suspension 30 mL  30 mL Oral Daily PRN Nanine Means, NP      . methocarbamol (ROBAXIN) tablet 500 mg  500 mg Oral Q8H PRN Nanine Means, NP   500 mg at 10/25/12 1159  . naproxen (NAPROSYN) tablet 500 mg  500 mg Oral BID PRN Nanine Means, NP   500 mg at 10/24/12 1427  . ondansetron (ZOFRAN-ODT) disintegrating tablet 4 mg  4 mg Oral Q6H PRN Nanine Means, NP   4 mg at 10/21/12 2043  . traZODone (DESYREL) tablet 50 mg  50 mg Oral QHS,MR X 1 Rachael Fee, MD   50 mg at 10/24/12 2230    Lab Results: No results found for this or any previous visit (from the past 48  hour(s)).  Physical Findings: AIMS: Facial and Oral Movements Muscles of Facial Expression: None, normal Lips and Perioral Area: None, normal Jaw: None, normal Tongue: None, normal,Extremity Movements Upper (arms, wrists, hands, fingers): None, normal Lower (legs, knees, ankles, toes): None, normal, Trunk Movements Neck, shoulders, hips: None, normal, Overall Severity Severity of abnormal movements (highest score from questions above): None, normal Incapacitation due to abnormal movements: None, normal Patient's awareness of abnormal movements (rate only patient's report): No Awareness, Dental Status Current problems with teeth and/or dentures?: No Does patient usually wear dentures?: No  CIWA:  CIWA-Ar Total: 0 COWS:  COWS Total Score: 6  Treatment Plan Summary: Daily contact with patient to assess and evaluate symptoms and progress in treatment Medication management  Plan: Supportive approach/coping skills/relapse prevention           Will decrease the Subutex to 2 mg BID and reassess  Medical Decision Making Problem Points:  Review of psycho-social stressors (1) Data Points:  Review of medication regiment & side effects (2) Review of new medications or change in dosage (2)  I certify that inpatient services furnished can reasonably be expected to improve the patient's condition.   Danielle Chandler A 10/25/2012, 8:50 PM

## 2012-10-25 NOTE — Progress Notes (Signed)
D: Patient denies SI/HI and A/V hallucinations; patient reports sleep is well; reports appetite is good; reports energy level is low ; reports ability to pay attention is improving; rates depression as 3/10; rates hopelessness 3/10; patient reports being tired today and also some muscle spasms  A: Monitored q 15 minutes; patient encouraged to attend groups; patient educated about medications; patient given medications per physician orders; patient encouraged to express feelings and/or concerns  R: Patient is flat and reports that she has not learned any coping skills today and reports that this would be her goal;  patient's interaction with staff and peers is appropriate; patient is taking medications as prescribed and tolerating medications; patient is attending all groups and engaging

## 2012-10-25 NOTE — Progress Notes (Signed)
Recreation Therapy Notes  Date: 08.26.2014 Time: 2:30pm Location: 300 Hall Dayroom  Group Topic: Animal Assisted Activities  Goal Area(s) Addresses:  Patient will interact appropriately with dog team.    Behavioral Response: Appropriate, Attentive, Engaged  Education: Coping Skill   Education Outcome: Acknowledges understanding  Clinical Observations/Feedback: Dog Team: [employer]. Patient interacted appropriately with peer, dog team and LRT.    Marykay Lex Marikay Roads, LRT/CTRS  Jearl Klinefelter 10/25/2012 4:41 PM

## 2012-10-26 MED ORDER — METHOCARBAMOL 500 MG PO TABS
500.0000 mg | ORAL_TABLET | Freq: Three times a day (TID) | ORAL | Status: DC | PRN
  Administered 2012-10-27: 500 mg via ORAL
  Filled 2012-10-26: qty 1

## 2012-10-26 MED ORDER — NAPROXEN 500 MG PO TABS
500.0000 mg | ORAL_TABLET | Freq: Two times a day (BID) | ORAL | Status: DC | PRN
  Administered 2012-10-27: 500 mg via ORAL
  Filled 2012-10-26: qty 1

## 2012-10-26 NOTE — Progress Notes (Signed)
D: Patient denies SI/HI/AVH. Patient affect is appropriate. Mood is anxious.  Pt states, "I feel a lot better.  I've been drinking water.  It's taken me 6 days, but I'm feeling better."  Patient did attend evening group. Patient visible on the milieu. No distress noted. A: Support and encouragement offered. Scheduled medications given to pt. Q 15 min checks continued for patient safety. R: Patient receptive. Patient remains safe on the unit.

## 2012-10-26 NOTE — BHH Group Notes (Signed)
[facility] LCSW Group Therapy  10/26/2012 3:09 PM  Type of Therapy:  Group Therapy  Participation Level:  Active  Participation Quality:  Attentive  Affect:  Appropriate  Cognitive:  Alert  Insight:  Engaged  Engagement in Therapy:  Engaged  Modes of Intervention:  Discussion, Education, Exploration, Socialization and Support  Summary of Progress/Problems: Emotion Regulation: This group focused on both positive and negative emotion identification and allowed group members to process ways to identify feelings, regulate negative emotions, and find healthy ways to manage internal/external emotions. Group members were asked to reflect on a time when their reaction to an emotion led to a negative outcome and explored how alternative responses using emotion regulation would have benefited them. Group members were also asked to discuss a time when emotion regulation was utilized when a negative emotion was experienced. Rylen was attentive and engaged throughout today's group. She identified confused, disoriented, and shocked as her primary emotions upon entering [facility]. Lu was able to identify what these emotions felt like both physically and mentally. She shows progress in the group setting AEB her level of participation and interaction with peers. Rhyleigh shows improving insight AEB her ability to identify "isolating, calling my dope man, and lashing out at others" as negative responses to dealing with negative emotions. Kurstin was able to identify ways to deal with negative emotions in a healthier way--"I want to go into [facility] to get more clean time and learn more skills. I want to start exercising again and make sure that I stay away from negative people and triggers."    Smart, Glenna Brunkow 10/26/2012, 3:09 PM

## 2012-10-26 NOTE — Progress Notes (Signed)
Adult Psychoeducational Group Note  Date:  10/26/2012 Time:  12:08 PM  Group Topic/Focus:  Crisis Planning:   The purpose of this group is to help patients create a crisis plan for use upon discharge or in the future, as needed.  Participation Level:  Active  Participation Quality:  Appropriate  Affect:  Appropriate  Cognitive:  Appropriate  Insight: Appropriate  Engagement in Group:  Engaged  Modes of Intervention:  Support  Additional Comments:  Patient created Crisis Plan  [name] 10/26/2012, 12:08 PM

## 2012-10-26 NOTE — Progress Notes (Signed)
Attended NA meeting

## 2012-10-26 NOTE — Progress Notes (Signed)
D: Patient denies SI/HI and A/V hallucinations; patient reports sleep is well; reports appetite is improving; reports energy level is low ; rates depression as 3/10; rates hopelessness 3/10;   A: Monitored q 15 minutes; patient encouraged to attend groups; patient educated about medications; patient given medications per physician orders; patient encouraged to express feelings and/or concerns  R: Patient is animated and cooperative; patient's interaction with staff and peers is appropriate;  patient is taking medications as prescribed and tolerating medications; patient is attending all groups and engaging

## 2012-10-26 NOTE — Progress Notes (Addendum)
Follow up with pt in day room of 300 hall for continued support.  She is in good spirits, feeling that her first day of tapering on suboxone has gone well.  Pt expressed relief that she has been able to celebrate birthday without using drugs.   Spoke with chaplain about support from her parents as well as parents of husband.  Has spoken with mother in law and reported that her husband is not angry that she is at [facility].  Pt has not been able to speak with husband, who is incarcerated, and reported feeling some anxiety around his receiving letter that she mailed to him.   Remains hopeful to be admitted to [facility] and requested prayers around discharge plans.   Belva Crome MDiv

## 2012-10-26 NOTE — BHH Group Notes (Signed)
[facility] LCSW Aftercare Discharge Planning Group Note   10/26/2012 9:23 AM  Participation Quality:  Appropriate   Mood/Affect:  Appropriate  Depression Rating:  0  Anxiety Rating:  7  Thoughts of Suicide:  No Will you contract for safety?   NA  Current AVH:  No  Plan for Discharge/Comments:  Pt reports that today is her birthday and she feels good today. Pt is still [facility] and will give to CSW this afternoon to fax. CSW called [facility]-facility will call back if bed becomes available today. Pt reports mild withdrawal symptoms today--aching and hot flashes.   Transportation Means: unknown at this time.   Supports: family   Smart, Herbert Seta

## 2012-10-26 NOTE — Progress Notes (Signed)
[facility] MD Progress Note  10/26/2012 4:42 PM Danielle Chandler  MRN:  [id number] Subjective:  Continues to be detox. She has a birthday today. She is looking at the fact that this is the first birthday in  a long time that she is sober. She states that since she was 9 and the dog bit her face she was given opioids and that early in her life she was able to tell how well she felt on them. Admits to a lot of regrets. But also states this time around she is really wanting to get her life back together. Her husband will be out in 2017 and she is going to be there for him. States she is committed to that relationship and to her kids. She is tolerating the decrease in Subutex but she is feeling it. Diagnosis:   DSM5: Schizophrenia Disorders:   Obsessive-Compulsive Disorders:   Trauma-Stressor Disorders:   Substance/Addictive Disorders:  Opioid Disorder - Severe (304.00) Depressive Disorders:  Major Depressive Disorder - Severe (296.23)  Axis I: Anxiety Disorder NOS  ADL's:  Intact  Sleep: Fair  Appetite:  Fair  Suicidal Ideation:  Plan:  denies Intent:  denies Means:  denies Homicidal Ideation:  Plan:  denies Intent:  denies Means:  denies AEB (as evidenced by):  Psychiatric Specialty Exam: Review of Systems  Constitutional: Negative.   Eyes: Negative.   Respiratory: Negative.   Cardiovascular: Negative.   Gastrointestinal: Negative.   Genitourinary: Negative.   Musculoskeletal: Positive for myalgias.  Skin: Negative.   Neurological: Positive for headaches.  Endo/Heme/Allergies: Negative.   Psychiatric/Behavioral: Positive for depression and substance abuse. The patient is nervous/anxious and has insomnia.     Blood pressure 110/79, pulse 86, temperature 97.4 F (36.3 C), temperature source Oral, resp. rate 16, height 5' 2.99" (1.6 m), weight 82.555 kg (182 lb), last menstrual period 09/19/2012.Body mass index is 32.25 kg/(m^2).  General Appearance: Fairly Groomed  Patent attorney::   Fair  Speech:  Clear and Coherent, Slow and not spontaneous  Volume:  Decreased  Mood:  Anxious and sad, worried  Affect:  Restricted  Thought Process:  Coherent and Goal Directed  Orientation:  Full (Time, Place, and Person)  Thought Content:  worries, concerns  Suicidal Thoughts:  No  Homicidal Thoughts:  No  Memory:  Immediate;   Fair Recent;   Fair Remote;   Fair  Judgement:  Fair  Insight:  Present  Psychomotor Activity:  Restlessness  Concentration:  Fair  Recall:  Fair  Akathisia:  No  Handed:  Right  AIMS (if indicated):     Assets:  Desire for Improvement  Sleep:  Number of Hours: 6.75   Current Medications: Current Facility-Administered Medications  Medication Dose Route Frequency Provider Last Rate Last Dose  . acetaminophen (TYLENOL) tablet 650 mg  650 mg Oral Q6H PRN Nanine Means, NP   650 mg at 10/26/12 1630  . alum & mag hydroxide-simeth (MAALOX/MYLANTA) 200-200-20 MG/5ML suspension 30 mL  30 mL Oral Q4H PRN Nanine Means, NP   30 mL at 10/23/12 2130  . buprenorphine (SUBUTEX) SL tablet 2 mg  2 mg Sublingual BID Rachael Fee, MD   2 mg at 10/26/12 1630  . chlordiazePOXIDE (LIBRIUM) capsule 25 mg  25 mg Oral TID PRN Rachael Fee, MD   25 mg at 10/23/12 2127  . gabapentin (NEURONTIN) capsule 200 mg  200 mg Oral TID Sanjuana Kava, NP   200 mg at 10/26/12 1630  . hydrOXYzine (ATARAX/VISTARIL) tablet  25 mg  25 mg Oral Q4H PRN Sanjuana Kava, NP   25 mg at 10/24/12 2158  . magnesium hydroxide (MILK OF MAGNESIA) suspension 30 mL  30 mL Oral Daily PRN Nanine Means, NP      . methocarbamol (ROBAXIN) tablet 500 mg  500 mg Oral Q8H PRN Rachael Fee, MD      . naproxen (NAPROSYN) tablet 500 mg  500 mg Oral BID PRN Rachael Fee, MD      . traZODone (DESYREL) tablet 50 mg  50 mg Oral QHS,MR X 1 Rachael Fee, MD   50 mg at 10/25/12 2204    Lab Results: No results found for this or any previous visit (from the past 48 hour(s)).  Physical Findings: AIMS: Facial and Oral  Movements Muscles of Facial Expression: None, normal Lips and Perioral Area: None, normal Jaw: None, normal Tongue: None, normal,Extremity Movements Upper (arms, wrists, hands, fingers): None, normal Lower (legs, knees, ankles, toes): None, normal, Trunk Movements Neck, shoulders, hips: None, normal, Overall Severity Severity of abnormal movements (highest score from questions above): None, normal Incapacitation due to abnormal movements: None, normal Patient's awareness of abnormal movements (rate only patient's report): No Awareness, Dental Status Current problems with teeth and/or dentures?: No Does patient usually wear dentures?: No  CIWA:  CIWA-Ar Total: 0 COWS:  COWS Total Score: 6  Treatment Plan Summary: Daily contact with patient to assess and evaluate symptoms and progress in treatment Medication management  Plan: Supportive approach/coping skills/relapse prevention           Continue Subutex 2 mg BID for one more round and then decease to 2 mg   Medical Decision Making Problem Points:  Review of psycho-social stressors (1) Data Points:  Review of medication regiment & side effects (2) Review of new medications or change in dosage (2)  I certify that inpatient services furnished can reasonably be expected to improve the patient's condition.   Lynzi Meulemans A 10/26/2012, 4:42 PM

## 2012-10-26 NOTE — Progress Notes (Signed)
Patient ID: Danielle Chandler, female   DOB: 1985/06/11, 27 y.o.   MRN: [id number] D: pt. Is dayroom, watching TV, reports day been "alright, they tapering me off my medicine, but it ain't nothing I can't handle" A: Writer introduced self to client and provided emotional support. Pt. Encouraged to attend group. Staff will monitor q74min for safety. R: Pt. Is safe on the unit. Pt. Attended group.

## 2012-10-27 MED ORDER — ONDANSETRON HCL 4 MG PO TABS
4.0000 mg | ORAL_TABLET | Freq: Three times a day (TID) | ORAL | Status: DC | PRN
  Administered 2012-10-27 – 2012-10-28 (×2): 4 mg via ORAL
  Filled 2012-10-27 (×2): qty 1

## 2012-10-27 NOTE — Progress Notes (Signed)
[facility] Group Notes:  (Nursing/MHT/Case Management/Adjunct)  Date:  10/27/2012  Time:  10:16 PM  Type of Therapy:  Group Therapy  Participation Level:  Active  Participation Quality:  Appropriate  Affect:  Appropriate  Cognitive:  Appropriate  Insight:  Appropriate  Engagement in Group:  Engaged  Modes of Intervention:  Activity, Socialization and Support  Summary of Progress/Problems: Pt. Participated in activity and stated she listens to music and talk with someone as a coping skill for stress.  Sondra Come 10/27/2012, 10:16 PM

## 2012-10-27 NOTE — Progress Notes (Signed)
Recreation Therapy Notes  Date: 08.28.2014 Time: 3:00pm Location: [address] Dayroom  Group Topic: Leisure Education  Goal Area(s) Addresses:  Patient will verbalize impact of positive leisure on sobriety. Patient will verbalize impact of leisure on self-esteem.  Behavioral Response: Did not attend.   Marykay Lex Harinder Romas, LRT/CTRS  Jearl Klinefelter 10/27/2012 4:54 PM

## 2012-10-27 NOTE — Progress Notes (Signed)
[facility] MD Progress Note  10/27/2012 4:32 PM Danielle Chandler  MRN:  [id number] Subjective:  Not feeling too well. She is still undergoing detox. She heard she is not going to be able to go to [facility] due to having charges pending. States that this news devastated her . States that she knows that if she does not go to a long term program she is not going to make it. Afraid of relapsing as she does not want to go trough all this again. She is feeling more down today Diagnosis:   DSM5: Schizophrenia Disorders:   Obsessive-Compulsive Disorders:   Trauma-Stressor Disorders:   Substance/Addictive Disorders:  Opioid Disorder - Severe (304.00) Depressive Disorders:  Major Depressive Disorder - Moderate (296.22)  Axis I: Anxiety Disorder NOS  ADL's:  Intact  Sleep: Fair  Appetite:  Fair  Suicidal Ideation:  Plan:  denies Intent:  denies Means:  denies Homicidal Ideation:  Plan:  denies Intent:  denies Means:  denies AEB (as evidenced by):  Psychiatric Specialty Exam: Review of Systems  Constitutional: Negative.   HENT: Negative.   Eyes: Negative.   Respiratory: Negative.   Cardiovascular: Negative.   Gastrointestinal: Negative.   Genitourinary: Negative.   Musculoskeletal: Negative.   Skin: Negative.   Neurological: Negative.   Endo/Heme/Allergies: Negative.   Psychiatric/Behavioral: Positive for depression, suicidal ideas and substance abuse. The patient is nervous/anxious and has insomnia.     Blood pressure 99/61, pulse 73, temperature 98.2 F (36.8 C), temperature source Oral, resp. rate 16, height 5' 2.99" (1.6 m), weight 82.555 kg (182 lb), last menstrual period 09/19/2012.Body mass index is 32.25 kg/(m^2).  General Appearance: Fairly Groomed  Patent attorney::  Fair  Speech:  Clear and Coherent, Slow and not spontaneous  Volume:  Decreased  Mood:  Anxious, Depressed and worried  Affect:  anxious, worried, sad  Thought Process:  Coherent and Goal Directed  Orientation:  Full  (Time, Place, and Person)  Thought Content:  worries, concerns  Suicidal Thoughts:  No  Homicidal Thoughts:  No  Memory:  Immediate;   Fair Recent;   Fair Remote;   Fair  Judgement:  Fair  Insight:  Present  Psychomotor Activity:  Restlessness  Concentration:  Fair  Recall:  Fair  Akathisia:  No  Handed:  Right  AIMS (if indicated):     Assets:  Desire for Improvement  Sleep:  Number of Hours: 6.25   Current Medications: Current Facility-Administered Medications  Medication Dose Route Frequency Provider Last Rate Last Dose  . acetaminophen (TYLENOL) tablet 650 mg  650 mg Oral Q6H PRN Nanine Means, NP   650 mg at 10/26/12 1630  . alum & mag hydroxide-simeth (MAALOX/MYLANTA) 200-200-20 MG/5ML suspension 30 mL  30 mL Oral Q4H PRN Nanine Means, NP   30 mL at 10/23/12 2130  . buprenorphine (SUBUTEX) SL tablet 2 mg  2 mg Sublingual BID Rachael Fee, MD   2 mg at 10/27/12 0809  . chlordiazePOXIDE (LIBRIUM) capsule 25 mg  25 mg Oral TID PRN Rachael Fee, MD   25 mg at 10/23/12 2127  . gabapentin (NEURONTIN) capsule 200 mg  200 mg Oral TID Sanjuana Kava, NP   200 mg at 10/27/12 1201  . hydrOXYzine (ATARAX/VISTARIL) tablet 25 mg  25 mg Oral Q4H PRN Sanjuana Kava, NP   25 mg at 10/24/12 2158  . magnesium hydroxide (MILK OF MAGNESIA) suspension 30 mL  30 mL Oral Daily PRN Nanine Means, NP      .  methocarbamol (ROBAXIN) tablet 500 mg  500 mg Oral Q8H PRN Rachael Fee, MD      . naproxen (NAPROSYN) tablet 500 mg  500 mg Oral BID PRN Rachael Fee, MD      . traZODone (DESYREL) tablet 50 mg  50 mg Oral QHS,MR X 1 Rachael Fee, MD   50 mg at 10/26/12 2237    Lab Results: No results found for this or any previous visit (from the past 48 hour(s)).  Physical Findings: AIMS: Facial and Oral Movements Muscles of Facial Expression: None, normal Lips and Perioral Area: None, normal Jaw: None, normal Tongue: None, normal,Extremity Movements Upper (arms, wrists, hands, fingers): None,  normal Lower (legs, knees, ankles, toes): None, normal, Trunk Movements Neck, shoulders, hips: None, normal, Overall Severity Severity of abnormal movements (highest score from questions above): None, normal Incapacitation due to abnormal movements: None, normal Patient's awareness of abnormal movements (rate only patient's report): No Awareness, Dental Status Current problems with teeth and/or dentures?: No Does patient usually wear dentures?: No  CIWA:  CIWA-Ar Total: 0 COWS:  COWS Total Score: 1  Treatment Plan Summary: Daily contact with patient to assess and evaluate symptoms and progress in treatment Medication management  Plan: Supportive approach/coping skills/relapse prevention           Will decrease the Subutex to 2 mg in AM starting 8/29           Identify other possible placement options  Medical Decision Making Problem Points:  Review of psycho-social stressors (1) Data Points:  Review of medication regiment & side effects (2)  I certify that inpatient services furnished can reasonably be expected to improve the patient's condition.   Luticia Tadros A 10/27/2012, 4:32 PM

## 2012-10-27 NOTE — Progress Notes (Signed)
D:  Patient up and visible in the milieu today.  She has attended and participated in some groups.  She is tolerating her medications.  She rates depression and hopelessness at 3 today.  Affect is bright and she is denying any thoughts of suicide or self harm.   A:  Medications given as prescribed.  Offered support and encouragement.  Encouraged participation in all groups.   R:  Cooperative with staff.  Interacting well with peers.  Has been supportive and respectful of her peers during group times.

## 2012-10-27 NOTE — [facility] Group Notes (Signed)
[facility] LCSW Group Therapy  10/27/2012 2:15 PM  Type of Therapy:  Group Therapy  Participation Level:  Did Not Attend  Smart, Kele Barthelemy 10/27/2012, 2:15 PM

## 2012-10-27 NOTE — Progress Notes (Signed)
Adult Psychoeducational Group Note  Date:  10/27/2012 Time:  10:07 AM  Group Topic/Focus:  Alcoholics Anonymous  Participation Level:  Active  Participation Quality:  Appropriate  Affect:  Appropriate  Cognitive:  Appropriate  Insight: Appropriate  Engagement in Group:  Engaged  Modes of Intervention:  Discussion  Additional Comments:  Pt attended AA group with counselor. Pt was engaged and appropriate.   [place] Shi K 10/27/2012, 10:07 AM

## 2012-10-27 NOTE — Progress Notes (Signed)
The focus of this group is to educate the patient on the purpose and policies of crisis stabilization and provide a format to answer questions about their admission.  The group details unit policies and expectations of patients while admitted.  Patient attended and actively participated in group.  She was respectful and supportive of others.

## 2012-10-27 NOTE — Progress Notes (Signed)
D   Pt is in bed resting with eyes closed and does not appear to be in any distress A   Will continue to monitor Q 15 min and provide medications and verbal support if needed R   Pt safe at present

## 2012-10-28 MED ORDER — DICYCLOMINE HCL 20 MG PO TABS
20.0000 mg | ORAL_TABLET | Freq: Four times a day (QID) | ORAL | Status: DC | PRN
  Administered 2012-10-29 – 2012-10-31 (×4): 20 mg via ORAL
  Filled 2012-10-28 (×4): qty 1

## 2012-10-28 MED ORDER — ONDANSETRON 4 MG PO TBDP
4.0000 mg | ORAL_TABLET | Freq: Four times a day (QID) | ORAL | Status: DC | PRN
  Administered 2012-10-29 – 2012-10-30 (×4): 4 mg via ORAL
  Filled 2012-10-28: qty 1

## 2012-10-28 MED ORDER — METHOCARBAMOL 500 MG PO TABS
500.0000 mg | ORAL_TABLET | Freq: Three times a day (TID) | ORAL | Status: DC | PRN
  Administered 2012-10-28 – 2012-10-31 (×8): 500 mg via ORAL
  Filled 2012-10-28 (×8): qty 1

## 2012-10-28 MED ORDER — BUPRENORPHINE HCL 2 MG SL SUBL: 2.0000 mg | SUBLINGUAL_TABLET | Freq: Every day | SUBLINGUAL | Status: DC

## 2012-10-28 MED ORDER — NAPROXEN 500 MG PO TABS
500.0000 mg | ORAL_TABLET | Freq: Two times a day (BID) | ORAL | Status: DC | PRN
  Administered 2012-10-28 – 2012-10-30 (×4): 500 mg via ORAL
  Filled 2012-10-28 (×4): qty 1

## 2012-10-28 MED ORDER — CLONIDINE HCL 0.1 MG PO TABS
0.1000 mg | ORAL_TABLET | Freq: Three times a day (TID) | ORAL | Status: DC
  Administered 2012-10-28 – 2012-10-31 (×6): 0.1 mg via ORAL
  Filled 2012-10-28 (×19): qty 1

## 2012-10-28 MED ORDER — HYDROXYZINE HCL 25 MG PO TABS
25.0000 mg | ORAL_TABLET | Freq: Four times a day (QID) | ORAL | Status: DC | PRN
  Administered 2012-10-28 – 2012-10-31 (×6): 25 mg via ORAL
  Filled 2012-10-28: qty 30

## 2012-10-28 MED ORDER — LOPERAMIDE HCL 2 MG PO CAPS: 2.0000 mg | ORAL_CAPSULE | ORAL | Status: DC | PRN

## 2012-10-28 MED ORDER — BUPRENORPHINE HCL 2 MG SL SUBL
2.0000 mg | SUBLINGUAL_TABLET | Freq: Every day | SUBLINGUAL | Status: AC
  Administered 2012-10-29: 2 mg via SUBLINGUAL
  Filled 2012-10-28: qty 1

## 2012-10-28 MED ORDER — BUPRENORPHINE HCL 2 MG SL SUBL
2.0000 mg | SUBLINGUAL_TABLET | Freq: Once | SUBLINGUAL | Status: AC
  Administered 2012-10-28: 2 mg via SUBLINGUAL
  Filled 2012-10-28: qty 1

## 2012-10-28 NOTE — Progress Notes (Signed)
Psychoeducational Group Note  Date:  10/28/2012 Time:  2000  Group Topic/Focus:  AA group  Participation Level: Did Not Attend  Participation Quality:  Not Applicable  Affect:  Not Applicable  Cognitive:  Not Applicable  Insight:  Not Applicable  Engagement in Group: Not Applicable  Additional Comments:    Flonnie Hailstone 10/28/2012, 10:08 PM

## 2012-10-28 NOTE — Progress Notes (Signed)
[facility] MD Progress Note  10/28/2012 3:57 PM Danielle Chandler  MRN:  [id number] Subjective:  Having some active withdrawal symptoms. Does not feel ready to let go of the Subutex just yet. She has an appointment to go into [facility] the 15.  She understands that she has to be off the Subutex if they are going to accept her in rehab. She has thought about staying on Suboxone maintenance, but understands that if she stays on it she will not be able to go to [facility]. Afraid of withdrawal, afraid of relapsing. Admits to increased worry, anxiety. Feeling overwhelmed. Her father has offered to keep her and keep an eye on her while waiting for the bed, but she is concerned about the withdrawal. Diagnosis:   DSM5: Schizophrenia Disorders:   Obsessive-Compulsive Disorders:   Trauma-Stressor Disorders:   Substance/Addictive Disorders:  Opioid Disorder - Severe (304.00) Depressive Disorders:  Major Depressive Disorder - Moderate (296.22)  Axis I: Anxiety Disorder NOS, Substance Abuse and Substance Induced Mood Disorder  ADL's:  Intact  Sleep: Fair  Appetite:  Poor  Suicidal Ideation:  Plan:  denies Intent:  denies Chandler:  denies Homicidal Ideation:  Plan:  denies Intent:  denies Chandler:  denies AEB (as evidenced by):  Psychiatric Specialty Exam: Review of Systems  Constitutional: Positive for malaise/fatigue.  Eyes: Negative.   Respiratory: Negative.   Cardiovascular: Negative.   Gastrointestinal: Positive for nausea.  Genitourinary: Negative.   Musculoskeletal: Positive for myalgias and joint pain.  Skin: Negative.   Neurological: Positive for dizziness and headaches.  Endo/Heme/Allergies: Negative.   Psychiatric/Behavioral: Positive for depression and substance abuse. The patient is nervous/anxious.     Blood pressure 110/77, pulse 99, temperature 97.7 F (36.5 C), temperature source Oral, resp. rate 19, height 5' 2.99" (1.6 m), weight 82.555 kg (182 lb), last menstrual period  09/19/2012.Body mass index is 32.25 kg/(m^2).  General Appearance: Fairly Groomed  Patent attorney::  Fair  Speech:  Clear and Coherent  Volume:  Normal  Mood:  Anxious, Dysphoric and worried  Affect:  anxious, worried  Thought Process:  Coherent and Goal Directed  Orientation:  Full (Time, Place, and Person)  Thought Content:  worries, concerns  Suicidal Thoughts:  No  Homicidal Thoughts:  No  Memory:  Immediate;   Fair Recent;   Fair Remote;   Fair  Judgement:  Fair  Insight:  Present  Psychomotor Activity:  Restlessness  Concentration:  Fair  Recall:  Fair  Akathisia:  No  Handed:  Right  AIMS (if indicated):     Assets:  Desire for Improvement  Sleep:  Number of Hours: 6.25   Current Medications: Current Facility-Administered Medications  Medication Dose Route Frequency Provider Last Rate Last Dose  . acetaminophen (TYLENOL) tablet 650 mg  650 mg Oral Q6H PRN Danielle Means, NP   650 mg at 10/26/12 1630  . alum & mag hydroxide-simeth (MAALOX/MYLANTA) 200-200-20 MG/5ML suspension 30 mL  30 mL Oral Q4H PRN Danielle Means, NP   30 mL at 10/23/12 2130  . [START ON 10/29/2012] buprenorphine (SUBUTEX) SL tablet 2 mg  2 mg Sublingual Daily Danielle Fee, MD      . buprenorphine (SUBUTEX) SL tablet 2 mg  2 mg Sublingual Once Danielle Fee, MD      . chlordiazePOXIDE (LIBRIUM) capsule 25 mg  25 mg Oral TID PRN Danielle Fee, MD   25 mg at 10/28/12 1420  . cloNIDine (CATAPRES) tablet 0.1 mg  0.1 mg Oral TID Danielle Reno  Chandler Lillie Portner, MD   0.1 mg at 10/28/12 1419  . dicyclomine (BENTYL) tablet 20 mg  20 mg Oral Q6H PRN Danielle Fee, MD      . gabapentin (NEURONTIN) capsule 200 mg  200 mg Oral TID Danielle Kava, NP   200 mg at 10/28/12 1138  . hydrOXYzine (ATARAX/VISTARIL) tablet 25 mg  25 mg Oral Q6H PRN Danielle Fee, MD      . loperamide (IMODIUM) capsule 2-4 mg  2-4 mg Oral PRN Danielle Fee, MD      . magnesium hydroxide (MILK OF MAGNESIA) suspension 30 mL  30 mL Oral Daily PRN Danielle Means, NP       . methocarbamol (ROBAXIN) tablet 500 mg  500 mg Oral Q8H PRN Danielle Fee, MD   500 mg at 10/28/12 1421  . naproxen (NAPROSYN) tablet 500 mg  500 mg Oral BID PRN Danielle Fee, MD   500 mg at 10/28/12 1420  . ondansetron (ZOFRAN-ODT) disintegrating tablet 4 mg  4 mg Oral Q6H PRN Danielle Fee, MD      . traZODone (DESYREL) tablet 50 mg  50 mg Oral QHS,MR X 1 Danielle Fee, MD   50 mg at 10/27/12 2233    Lab Results: No results found for this or any previous visit (from the past 48 hour(s)).  Physical Findings: AIMS: Facial and Oral Movements Muscles of Facial Expression: None, normal Lips and Perioral Area: None, normal Jaw: None, normal Tongue: None, normal,Extremity Movements Upper (arms, wrists, hands, fingers): None, normal Lower (legs, knees, ankles, toes): None, normal, Trunk Movements Neck, shoulders, hips: None, normal, Overall Severity Severity of abnormal movements (highest score from questions above): None, normal Incapacitation due to abnormal movements: None, normal Patient's awareness of abnormal movements (rate only patient's report): No Awareness, Dental Status Current problems with teeth and/or dentures?: No Does patient usually wear dentures?: No  CIWA:  CIWA-Ar Total: 2 COWS:  COWS Total Score: 8  Treatment Plan Summary: Daily contact with patient to assess and evaluate symptoms and progress in treatment Medication management  Plan: Supportive approach/coping skills/relapse prevention           Will attempt to come off the Subutex using the clonidine detox protocol            Will defer decreasing to one tab for tomorrow, meanwhile, will give her the second Subutex            today Medical Decision Making Problem Points:  Review of psycho-social stressors (1) Data Points:  Review of medication regiment & side effects (2) Review of new medications or change in dosage (2)  I certify that inpatient services furnished can reasonably be expected to improve the  patient's condition.   Danielle Chandler 10/28/2012, 3:57 PM

## 2012-10-28 NOTE — Progress Notes (Signed)
Patient ID: Danielle Chandler, female   DOB: [birth year], 27 y.o.   MRN: [id number]  D: Pt isolative to room, presents with dull, flat affect. Pt with med-seeking behaviors.  A: Q 15 minute safety checks, encourage group attendance and peer interaction. Administer medications as ordered. R: Pt did not attend group session, but was compliant with medications. No s/s of distress noted. Pt denies SI/HI.

## 2012-10-28 NOTE — Tx Team (Signed)
Interdisciplinary Treatment Plan Update (Adult)  Date: 10/28/2012   Time Reviewed: 11:08 AM  Progress in Treatment:  Attending groups: Yes Participating in groups:  Yes Taking medication as prescribed: Yes  Tolerating medication: Yes  Family/Significant othe contact made:Yes.  Pt's father.   Patient understands diagnosis: Yes, AEB seeking treatment for heroin detox and depression.  Discussing patient identified problems/goals with staff: Yes  Medical problems stabilized or resolved: Yes  Denies suicidal/homicidal ideation: Yes during admission/self report Patient has not harmed self or Others: Yes  New problem(s) identified: Pt still detoxing off suboxine and will stay through the weekend. Likely d/c on Monday. Discharge Plan or Barriers: Pt unable to be admitted into [facility] due to pending court dates. [facility] application completed and faxed. Pt planning to stay with her father and followup at [facility] until admission date at [facility] 9/15. Pt is [facility] been living with her grandmother in [place] and is eligible for [employer].   Additional comments: n/a  Reason for Continuation of Hospitalization: Subutex Medication management Mood stabilzation Estimated length of stay: 2-3 days (likely d/c Monday)  For review of initial/current patient goals, please see plan of care.  Attendees:  Patient:    Family:    Physician: Geoffery Lyons MD  10/28/2012 11:11 AM   Nursing: Philippa Chester RN  10/28/2012 11:08 AM   Clinical Social Worker Anjolie Majer Smart, LCSWA  10/28/2012 11:08 AM   Other:  10/28/2012 11:08 AM   Other: Aggie PA 10/28/2012 11:08 AM   Other:  Darden Dates Nurse CM 10/28/2012 11:08 AM   Other:    Scribe for Treatment Team:  [employer] LCSWA 10/28/2012 11:08 AM

## 2012-10-28 NOTE — Progress Notes (Signed)
D:  Danielle Chandler reports that she is experiencing withdrawal symptoms today.  She has been given librium and vistaril to helps with withdrawals and anxiety.  She states that she slept ok and her appetite is improving.  She is rating depression at 2/10 and hopelessness at 2/10.  She is attending groups and is interacting appropriately with staff and others.  She denies any SI/HI/AVH at this time. A:  Safety checks q 15 minutes.  Emotional support provided.  Medications administered as ordered. R:  Safety maintained on uni.t

## 2012-10-28 NOTE — [facility] Group Notes (Signed)
[facility] LCSW Group Therapy  10/28/2012 2:35 PM  Type of Therapy:  Group Therapy  Participation Level:  Active  Participation Quality:  Attentive  Affect:  Appropriate  Cognitive:  Alert  Insight:  Engaged  Engagement in Therapy:  Engaged  Modes of Intervention:  Discussion, Education, Exploration, Socialization and Support  Summary of Progress/Problems: Feelings around Relapse. Group members discussed the meaning of relapse and shared personal stories of relapse, how it affected them and others, and how they perceived themselves during this time. Group members were encouraged to identify triggers, warning signs and coping skills used when facing the possibility of relapse. Social supports were discussed and explored in detail. Post Acute Withdrawal Syndrome (handout provided) was introduced and examined. Pt's were encouraged to ask questions, talk about key points associated with PAWS, and process this information in terms of relapse prevention. Mysti came to group late. She had been meeting with the doctor prior to coming to group. Tyaira followed along as CSW reviewed PAWS information. Sharah shows progress in the group setting AEB her ability to actively participate in group discussion regarding PAWS. She asked questions about what to expect, how long to expect symptoms, and how to develop a safety plan. Aanya acknowledged the importance of having a safety plan in place in order to prevent relapse. She stated that she plans to share this information with her support system.    Smart, Nalayah Hitt 10/28/2012, 2:35 PM

## 2012-10-28 NOTE — [facility] Group Notes (Signed)
[facility] LCSW Aftercare Discharge Planning Group Note   10/28/2012 9:54 AM  Participation Quality:  Appropriate   Mood/Affect:  Appropriate  Depression Rating:  2  Anxiety Rating:  6  Thoughts of Suicide:  No Will you contract for safety?   NA  Current AVH:  No  Plan for Discharge/Comments:  Pt reported that she finished filling out [facility] application. CSW informed pt she has [facility] date scheduled for 9//15. Pt was happy to hear this. Pt to follow up at [facility] for med management.   Transportation Means: father  Supports: Conservation officer, nature, Research scientist (physical sciences)

## 2012-10-28 NOTE — Progress Notes (Signed)
Adult Psychoeducational Group Note  Date:  10/28/2012 Time:  1:07 PM  Group Topic/Focus:  Relapse Prevention Planning:   The focus of this group is to define relapse and discuss the need for planning to combat relapse.  Participation Level:  Active  Participation Quality:  Appropriate, Sharing and Supportive  Affect:  Appropriate  Cognitive:  Appropriate  Insight: Appropriate  Engagement in Group:  Engaged  Modes of Intervention:  Activity and Discussion  Additional Comments:  Pt where taught how to write SMART goals in relation to relapse prevention and recovery.   Tora Perches N 10/28/2012, 1:07 PM

## 2012-10-29 DIAGNOSIS — F331 Major depressive disorder, recurrent, moderate: Secondary | ICD-10-CM

## 2012-10-29 NOTE — Progress Notes (Signed)
[facility] MD Progress Note  10/29/2012 4:04 PM Danielle Chandler  MRN:  [id number] Subjective:  Was asleep in the day room this afternoon, and was awakened with verbal prompts. She reports that she is in severe withdrawal and experiencing nausea, joint pain, and periods of hot and cold. She states that she is sleeping well, and that her appetite is good. She denies any suicidal or homicidal ideation she denies any auditory or visual hallucinations.  Diagnosis:  DSM5: Schizophrenia Disorders:   Obsessive-Compulsive Disorders:   Trauma-Stressor Disorders:   Substance/Addictive Disorders:  Opioid Disorder - Severe (304.00) Depressive Disorders:  Major Depressive Disorder - Moderate (296.22)  Axis I: Major Depression, Recurrent moderate, Substance Abuse and Substance Induced Mood Disorder Axis II: Deferred Axis III:  Past Medical History  Diagnosis Date  . Headache(784.0)   . Anxiety   . Drug use   . CAP (community acquired pneumonia)   . Depression     ADL's:  Intact  Sleep: Good  Appetite:  Good  Suicidal Ideation:  Patient denies any thought, plan, or intent Homicidal Ideation:  Patient denies any thought, plan, or intent AEB (as evidenced by):  Psychiatric Specialty Exam: Review of Systems  Constitutional: Positive for malaise/fatigue.  HENT: Negative.   Eyes: Negative.   Respiratory: Negative.   Cardiovascular: Negative.   Gastrointestinal: Positive for nausea.  Genitourinary: Negative.   Musculoskeletal: Positive for joint pain.  Skin: Negative.   Neurological: Negative.   Endo/Heme/Allergies: Negative.   Psychiatric/Behavioral: Positive for substance abuse.    Blood pressure 91/60, pulse 84, temperature 98 F (36.7 C), temperature source Oral, resp. rate 17, height 5' 2.99" (1.6 m), weight 82.555 kg (182 lb), last menstrual period 09/19/2012.Body mass index is 32.25 kg/(m^2).  General Appearance: Disheveled  Eye Solicitor::  Fair  Speech:  Slurred  Volume:  Decreased   Mood:  Dysphoric  Affect:  Depressed  Thought Process:  Circumstantial  Orientation:  Full (Time, Place, and Person)  Thought Content:  Obsessions  Suicidal Thoughts:  No  Homicidal Thoughts:  No  Memory:  Immediate;   Fair Recent;   Fair Remote;   Fair  Judgement:  Impaired  Insight:  Shallow  Psychomotor Activity:  Psychomotor Retardation  Concentration:  Fair  Recall:  Fair  Akathisia:  No  Handed:  Right  AIMS (if indicated):     Assets:  Social Support  Sleep:  Number of Hours: 6.25   Current Medications: Current Facility-Administered Medications  Medication Dose Route Frequency Provider Last Rate Last Dose  . acetaminophen (TYLENOL) tablet 650 mg  650 mg Oral Q6H PRN Nanine Means, NP   650 mg at 10/26/12 1630  . alum & mag hydroxide-simeth (MAALOX/MYLANTA) 200-200-20 MG/5ML suspension 30 mL  30 mL Oral Q4H PRN Nanine Means, NP   30 mL at 10/23/12 2130  . chlordiazePOXIDE (LIBRIUM) capsule 25 mg  25 mg Oral TID PRN Rachael Fee, MD   25 mg at 10/29/12 0817  . cloNIDine (CATAPRES) tablet 0.1 mg  0.1 mg Oral TID Rachael Fee, MD   0.1 mg at 10/29/12 1201  . dicyclomine (BENTYL) tablet 20 mg  20 mg Oral Q6H PRN Rachael Fee, MD   20 mg at 10/29/12 0818  . gabapentin (NEURONTIN) capsule 200 mg  200 mg Oral TID Sanjuana Kava, NP   200 mg at 10/29/12 1201  . hydrOXYzine (ATARAX/VISTARIL) tablet 25 mg  25 mg Oral Q6H PRN Rachael Fee, MD   25 mg at 10/28/12  2132  . loperamide (IMODIUM) capsule 2-4 mg  2-4 mg Oral PRN Rachael Fee, MD      . magnesium hydroxide (MILK OF MAGNESIA) suspension 30 mL  30 mL Oral Daily PRN Nanine Means, NP      . methocarbamol (ROBAXIN) tablet 500 mg  500 mg Oral Q8H PRN Rachael Fee, MD   500 mg at 10/29/12 1201  . naproxen (NAPROSYN) tablet 500 mg  500 mg Oral BID PRN Rachael Fee, MD   500 mg at 10/29/12 0818  . ondansetron (ZOFRAN-ODT) disintegrating tablet 4 mg  4 mg Oral Q6H PRN Rachael Fee, MD   4 mg at 10/29/12 0818  . traZODone  (DESYREL) tablet 50 mg  50 mg Oral QHS,MR X 1 Rachael Fee, MD   50 mg at 10/28/12 2220    Lab Results: No results found for this or any previous visit (from the past 48 hour(s)).  Physical Findings: AIMS: Facial and Oral Movements Muscles of Facial Expression: None, normal Lips and Perioral Area: None, normal Jaw: None, normal Tongue: None, normal,Extremity Movements Upper (arms, wrists, hands, fingers): None, normal Lower (legs, knees, ankles, toes): None, normal, Trunk Movements Neck, shoulders, hips: None, normal, Overall Severity Severity of abnormal movements (highest score from questions above): None, normal Incapacitation due to abnormal movements: None, normal Patient's awareness of abnormal movements (rate only patient's report): No Awareness, Dental Status Current problems with teeth and/or dentures?: No Does patient usually wear dentures?: No  CIWA:  CIWA-Ar Total: 11 COWS:  COWS Total Score: 0  Treatment Plan Summary: Daily contact with patient to assess and evaluate symptoms and progress in treatment Medication management  Plan: Patient had final dose of Subutex this morning, and will likely begin to experience worsening withdrawal. We will continue to use clonidine, naproxen, Robaxin, Zofran, and Bentyl to treat opiate withdrawal syndrome. We will continue her other medications as prescribed. We'll continue to support her and make plans for followup treatment.  Medical Decision Making Problem Points:  Established problem, worsening (2) and Review of psycho-social stressors (1) Data Points:  Review or order clinical lab tests (1) Review of medication regiment & side effects (2)  I certify that inpatient services furnished can reasonably be expected to improve the patient's condition.   Javyn Havlin 10/29/2012, 4:04 PM

## 2012-10-29 NOTE — [facility] Group Notes (Signed)
[facility] Group Notes:  (Nursing/MHT/Case Management/Adjunct)  Date:  10/29/2012  Time:  1:27 PM  Type of Therapy:  Psychoeducational Skills  Participation Level:  Active  Participation Quality:  Appropriate  Affect:  Appropriate  Cognitive:  Appropriate  Insight:  Appropriate  Engagement in Group:  Engaged  Modes of Intervention:  Problem-solving  Summary of Progress/Problems: Pt attended self inventory group, and engaged in treatment. Pt reported that she was having a hard detox and that she needs all the medication that she can get. Pt also reported that she can not be admitted to [facility] until the 15th, and that she was hoping that she could go to another program until her bed was open.   Jacquelyne Balint Shanta 10/29/2012, 1:27 PM

## 2012-10-29 NOTE — Progress Notes (Signed)
Psychoeducational Group Note  Date:  10/29/2012 Time:  0945 am  Group Topic/Focus:  Identifying Needs:   The focus of this group is to help patients identify their personal needs that have been historically problematic and identify healthy behaviors to address their needs.  Participation Level:  Minimal  Participation Quality:  Drowsy  Affect:  Lethargic  Cognitive:  Lacking  Insight:  Limited  Engagement in Group:  Limited  Additional Comments:    Andrena Mews 10/29/2012,3:52 PM

## 2012-10-29 NOTE — [facility] Group Notes (Signed)
[facility] Group Notes:  (Clinical Social Work)  10/29/2012     10-11AM  Summary of Progress/Problems:   The main focus of today's process group was for the patient to identify ways in which they have in the past sabotaged their own recovery. Motivational Interviewing was utilized to ask the group members what they get out of their substance use, and what reasons they may have for wanting to change.  The Stages of Change were explained using a handout, and patients identified where they currently are with regard to stages of change.  The patient expressed that she uses now to stay "unsick" because she wakes up so sick every day.  She says that the "high" is now missing, and after "just two years", the fun is gone.  She wants to change because she is tired of using, wants to return to who she knows she really is.  She is tired of hating herself, and although her children are an inspiration, she realizes she needs to do this for herself and not them.  She is in the Preparation Stage.  Type of Therapy:  Group Therapy - Process   Participation Level:  Minimal  Participation Quality:  Drowsy and Inattentive  Affect:  Blunted and Depressed  Cognitive:  Oriented  Insight:  Limited  Engagement in Therapy:  Limited  Modes of Intervention:  Education, Support and Processing, Motivational Interviewing  Ambrose Mantle, LCSW 10/29/2012, 12:40 PM

## 2012-10-29 NOTE — Progress Notes (Signed)
Adult Psychoeducational Group Note  Date:  10/29/2012 Time:  4:17 PM  Group Topic/Focus:  Healthy Communication:   The focus of this group is to discuss communication, barriers to communication, as well as healthy ways to communicate with others.  Participation Level:  None  Participation Quality:  Drowsy  Affect:  Lethargic  Cognitive:  Appropriate  Insight: None  Engagement in Group:  None  Modes of Intervention:  Activity and Discussion  Additional Comments:  Pt was drowsy during the duration of the group. Pt was prompted to wake up but feel back asleep. Pt was not engaged during group.   Tora Perches N 10/29/2012, 4:17 PM

## 2012-10-29 NOTE — Progress Notes (Signed)
D.  Pt pleasant on approach, complaint of withdrawal since being taken off of Subutex.  Positive for evening wrap up group, interacting appropriately within milieu.  Denies SI/HI/hallucinations at this time.  Will be going to [facility] upon leaving here.  A.  Medication given as ordered for withdrawal symptoms.  Support and encouragement offered  R.  Pt remains safe, will continue to monitor.

## 2012-10-29 NOTE — Progress Notes (Signed)
Patient ID: Danielle Chandler, female   DOB: 11/01/1985, 27 y.o.   MRN: [id number]  D: Pt has been very flat and depressed on the unit. Pt has also been very med seeking and requesting all the medications she can have around the clock. Pt reported that she is having bad withdrawals and that she needs her medication to remain the same. Pt reported that she will be going to [facility] on the 15th, and that if she is released she will relapse again. Pt reported being negative SI/HI, no AH/VH noted. A: 15 min checks continued for patient safety. R: Pts safety maintained.

## 2012-10-30 NOTE — Progress Notes (Signed)
Patient ID: Danielle Chandler, female   DOB: Mar 15, 1985, 27 y.o.   MRN: [id number]  D: Pt has been flat this morning, she reported that she was having a hard day. Pt reported that she had been vomiting all morning, she was given Zofran. Pt reported that the Clonidine made her sick, so she refused her morning dose. Pt reported being negative SI/HI, no AH/VH noted. A: 15 min checks continued for patient safety. R: Pts safety maintained.

## 2012-10-30 NOTE — Progress Notes (Signed)
D.  Pt continues to complain of withdrawal from Subutex, will be going to long term treatment upon discharge.  Denies SI/HI/hallucinations at this time.  Interacting appropriately within milieu.  Positive for evening AA group tonight.  A.  Support and encouragement offered, medication given as ordered for withdrawal symptoms.  R.  Pt remains safe on unit, no acute distress noted.  Will continue to monitor.

## 2012-10-30 NOTE — [facility] Group Notes (Signed)
[facility] Group Notes:  (Clinical Social Work)  10/30/2012  10:00-11:00AM  Summary of Progress/Problems:   The main focus of today's process group was to   identify the patient's current support system and decide on other supports that can be put in place.  The picture on workbook was used to discuss why additional supports are needed, and a hand-out was distributed with four definitions/levels of support, then used to talk about how patients have given and received all different kinds of support.  An emphasis was placed on using counselor, doctor, therapy groups, 12-step groups, and problem-specific support groups to expand supports.  The patient initially stated she does not yet feel ready to leave the hospital.  Her appointment for admission to [facility] is 11/14/12, and she is scared about the 2-week interim period.  She has applied to [facility] and is trying to complete an application to [facility] that will need to be faxed.  She states she cannot go to her mother's house because she would use immediately there.  Her father and stepmother have said she can come there to live in the 2-week period, but that she will have to be with one of them 24 hours a day, go to work with them, and abide by strict rules.  Her window in the home has already been nailed shut.  She shared that the problem is that both father and stepmother are heavy drinkers and there is marijuana in the house.  We discussed the possibility of talking with them about how this could be a trigger for her, even though her drug of choice is different.  She feels they will lock up the marijuana without hesitatioh, but she is reluctant to ask them to put away the alcohol because it is their house and she does not feel she has a right to change them; however, she acknowledged that her father is already trying to help her and should know fully what would be helpful and even mandatory for her sobriety.  She will think about asking this, but  did not commit to it fully.  Type of Therapy:  Process Group with Motivational Interviewing  Participation Level:  Active  Participation Quality:  Attentive, Drowsy and Sharing  Affect:  Flat  Cognitive:  Oriented  Insight:  Engaged  Engagement in Therapy:  Engaged  Modes of Intervention:   Education, Support and Processing, Activity  [employer], LCSW 10/30/2012, 1:07 PM

## 2012-10-30 NOTE — Progress Notes (Signed)
[facility] Group Notes:  (Nursing/MHT/Case Management/Adjunct)  Date:  10/29/2012   Time:  2100 Type of Therapy:  wrap up group  Participation Level:  Active  Participation Quality:  Appropriate, Attentive, Sharing and Supportive  Affect:  Appropriate  Cognitive:  Appropriate  Insight:  Good  Engagement in Group:  Engaged  Modes of Intervention:  Clarification, Education and Support  Summary of Progress/Problems: Pt states " I've been up and then super duper down". Her plans are to go to [facility] and reports wanting to become the person she knows she can be. Someone that is dependable, trustworthy, and sober.   Shelah Lewandowsky 10/30/2012, 4:07 AM

## 2012-10-30 NOTE — [facility] Group Notes (Signed)
[facility] Group Notes:  (Nursing/MHT/Case Management/Adjunct)  Date:  10/30/2012  Time:  10:31 AM  Type of Therapy: Psychoeducational Skills  Participation Level: Active  Participation Quality: Appropriate  Affect: Appropriate  Cognitive: Appropriate  Insight: Appropriate  Engagement in Group: Engaged  Modes of Intervention: Problem-solving  Summary of Progress/Problems: Pt attended self inventory group, and engaged in treatment.  Jacquelyne Balint Shanta 10/30/2012, 10:31 AM

## 2012-10-30 NOTE — Progress Notes (Signed)
[facility] MD Progress Note  10/30/2012 2:28 PM Danielle Chandler  MRN:  [id number] Subjective:  Danielle Chandler is in withdrawal and experiencing stomach cramps, muscle aches, and joint pain, but she denies any cravings. She reports that her sleep and appetite are good. She reports that her mood is okay and denies any suicidal or homicidal ideation. She denies any auditory or visual hallucinations. She expresses a significant amount of worry about where she can go after leaving here since she does not have a bed at [facility] until September 15. She also is concerned about her children and their involvement with DSS/CPS. Diagnosis:   DSM5: Schizophrenia Disorders:   Obsessive-Compulsive Disorders:   Trauma-Stressor Disorders:   Substance/Addictive Disorders:  Opioid Disorder - Severe (304.00) Depressive Disorders:  Major Depressive Disorder - Severe (296.23)  Axis I: Major Depression, Recurrent severe, Substance Abuse and Substance Induced Mood Disorder Axis II: Deferred Axis III:  Past Medical History  Diagnosis Date  . Headache(784.0)   . Anxiety   . Drug use   . CAP (community acquired pneumonia)   . Depression     ADL's:  Intact  Sleep: Good  Appetite:  Good  Suicidal Ideation:  Patient denies any thought, plan, or intent Homicidal Ideation:  Patient denies any thought, plan, or intent AEB (as evidenced by):  Psychiatric Specialty Exam: Review of Systems  Constitutional: Negative.   HENT: Negative.   Eyes: Negative.   Respiratory: Negative.   Cardiovascular: Negative.   Gastrointestinal: Positive for constipation.  Genitourinary: Negative.   Musculoskeletal: Positive for myalgias and joint pain.  Skin: Negative.   Neurological: Negative.   Endo/Heme/Allergies: Negative.     Blood pressure 117/78, pulse 65, temperature 97.8 F (36.6 C), temperature source Oral, resp. rate 16, height 5' 2.99" (1.6 m), weight 82.555 kg (182 lb), last menstrual period 09/19/2012.Body mass index is 32.25  kg/(m^2).  General Appearance: Disheveled  Eye Contact::  Good  Speech:  Clear and Coherent  Volume:  Normal  Mood:  Depressed  Affect:  Depressed  Thought Process:  Linear  Orientation:  Full (Time, Place, and Person)  Thought Content:  Rumination  Suicidal Thoughts:  No  Homicidal Thoughts:  No  Memory:  Immediate;   Fair Recent;   Fair Remote;   Fair  Judgement:  Fair  Insight:  Lacking  Psychomotor Activity:  Decreased  Concentration:  Fair  Recall:  Fair  Akathisia:  No  Handed:  Right  AIMS (if indicated):     Assets:  Communication Skills Desire for Improvement Social Support  Sleep:  Number of Hours: 5.75   Current Medications: Current Facility-Administered Medications  Medication Dose Route Frequency Provider Last Rate Last Dose  . acetaminophen (TYLENOL) tablet 650 mg  650 mg Oral Q6H PRN Nanine Means, NP   650 mg at 10/26/12 1630  . alum & mag hydroxide-simeth (MAALOX/MYLANTA) 200-200-20 MG/5ML suspension 30 mL  30 mL Oral Q4H PRN Nanine Means, NP   30 mL at 10/23/12 2130  . chlordiazePOXIDE (LIBRIUM) capsule 25 mg  25 mg Oral TID PRN Rachael Fee, MD   25 mg at 10/30/12 4098  . cloNIDine (CATAPRES) tablet 0.1 mg  0.1 mg Oral TID Rachael Fee, MD   0.1 mg at 10/30/12 1147  . dicyclomine (BENTYL) tablet 20 mg  20 mg Oral Q6H PRN Rachael Fee, MD   20 mg at 10/29/12 2151  . gabapentin (NEURONTIN) capsule 200 mg  200 mg Oral TID Sanjuana Kava, NP  200 mg at 10/30/12 1147  . hydrOXYzine (ATARAX/VISTARIL) tablet 25 mg  25 mg Oral Q6H PRN Rachael Fee, MD   25 mg at 10/30/12 8119  . loperamide (IMODIUM) capsule 2-4 mg  2-4 mg Oral PRN Rachael Fee, MD      . magnesium hydroxide (MILK OF MAGNESIA) suspension 30 mL  30 mL Oral Daily PRN Nanine Means, NP      . methocarbamol (ROBAXIN) tablet 500 mg  500 mg Oral Q8H PRN Rachael Fee, MD   500 mg at 10/30/12 1478  . naproxen (NAPROSYN) tablet 500 mg  500 mg Oral BID PRN Rachael Fee, MD   500 mg at 10/29/12 2150  .  ondansetron (ZOFRAN-ODT) disintegrating tablet 4 mg  4 mg Oral Q6H PRN Rachael Fee, MD   4 mg at 10/30/12 [phone]  . traZODone (DESYREL) tablet 50 mg  50 mg Oral QHS,MR X 1 Rachael Fee, MD   50 mg at 10/29/12 2307    Lab Results: No results found for this or any previous visit (from the past 48 hour(s)).  Physical Findings: AIMS: Facial and Oral Movements Muscles of Facial Expression: None, normal Lips and Perioral Area: None, normal Jaw: None, normal Tongue: None, normal,Extremity Movements Upper (arms, wrists, hands, fingers): None, normal Lower (legs, knees, ankles, toes): None, normal, Trunk Movements Neck, shoulders, hips: None, normal, Overall Severity Severity of abnormal movements (highest score from questions above): None, normal Incapacitation due to abnormal movements: None, normal Patient's awareness of abnormal movements (rate only patient's report): No Awareness, Dental Status Current problems with teeth and/or dentures?: No Does patient usually wear dentures?: No  CIWA:  CIWA-Ar Total: 4 COWS:  COWS Total Score: 0  Treatment Plan Summary: Daily contact with patient to assess and evaluate symptoms and progress in treatment Medication management Attempt to place at [facility] earlier than expected  Plan:  Medical Decision Making Problem Points:  Established problem, stable/improving (1) and Review of psycho-social stressors (1) Data Points:  Review or order clinical lab tests (1) Review of medication regiment & side effects (2)  I certify that inpatient services furnished can reasonably be expected to improve the patient's condition.   Gael Londo 10/30/2012, 2:28 PM

## 2012-10-31 NOTE — Progress Notes (Signed)
Chaplain provided brief follow up support with pt on 300 hallway.  Pt expressed concern about discharge plans, stating that she is hopeful to continue meetings.  Spoke with chaplain about DSS involvement and feels positive about this, as they are helping her take positive steps.    Belva Crome MDiv

## 2012-10-31 NOTE — Progress Notes (Signed)
Patient ID: Danielle Chandler, female   DOB: 17-Jun-1985, 27 y.o.   MRN: [id number]  D: Pt has been appropriate this morning. Pt has attended all groups and has engaged in treatment. Pt continues to report that she is not ready for discharge, and that she continues to experience withdrawal symptoms. Pt reports being negative SI/HI, no AH/VH noted. A: 15 min checks continued for patient safety. R: Pts safety maintained.

## 2012-10-31 NOTE — Progress Notes (Signed)
Patient resting quietly with eyes closed. Respirations even and unlabored. No distress noted, Q 15 minute check continues as ordered to maintain safety.  

## 2012-10-31 NOTE — Progress Notes (Signed)
Patient did attend the evening speaker AA meeting.  

## 2012-10-31 NOTE — Progress Notes (Signed)
[facility] MD Progress Note  10/31/2012 3:41 PM Danielle Chandler  MRN:  [id number] Subjective:  Danielle Chandler got very upse tas heard that DSS will not allowed her to be in th same house with his father as he is keeping the kids. Later on they were able to explain the situation to DSS and they were OK with her being there. She has an appointment at [facility] the 15. She is off the Subutex. She is still experieng some milder symptoms of withdrawal. She is dealing with them as they happen Diagnosis:   DSM5: Schizophrenia Disorders:   Obsessive-Compulsive Disorders:   Trauma-Stressor Disorders:   Substance/Addictive Disorders:  Opioid Disorder - Severe (304.00) Depressive Disorders:  Major Depressive Disorder - Moderate (296.22)  Axis I: Anxiety Disorder NOS  ADL's:  Intact  Sleep: Fair  Appetite:  Fair  Suicidal Ideation:  Plan:  denies Intent:  denies Means:  denies Homicidal Ideation:  Plan:  denies Intent:  denies Means:  denies AEB (as evidenced by):  Psychiatric Specialty Exam: Review of Systems  Constitutional: Positive for malaise/fatigue.  HENT: Negative.   Eyes: Negative.   Respiratory: Negative.   Cardiovascular: Negative.   Gastrointestinal: Negative.   Genitourinary: Negative.   Musculoskeletal: Positive for myalgias.  Skin: Negative.   Neurological: Positive for dizziness.  Endo/Heme/Allergies: Negative.   Psychiatric/Behavioral: Positive for substance abuse. The patient is nervous/anxious.     Blood pressure 109/64, pulse 83, temperature 97.7 F (36.5 C), temperature source Oral, resp. rate 16, height 5' 2.99" (1.6 m), weight 82.555 kg (182 lb), last menstrual period 09/19/2012.Body mass index is 32.25 kg/(m^2).  General Appearance: Fairly Groomed  Patent attorney::  Fair  Speech:  Clear and Coherent and Slow  Volume:  Decreased  Mood:  Anxious, Depressed and worried  Affect:  Restricted  Thought Process:  Coherent and Goal Directed  Orientation:  Full (Time, Place, and  Person)  Thought Content:  worries, concerns  Suicidal Thoughts:  No  Homicidal Thoughts:  No  Memory:  Immediate;   Fair Recent;   Fair Remote;   Fair  Judgement:  Fair  Insight:  Present and Shallow  Psychomotor Activity:  Restlessness  Concentration:  Fair  Recall:  Fair  Akathisia:  No  Handed:  Right  AIMS (if indicated):     Assets:  Desire for Improvement  Sleep:  Number of Hours: 4.5   Current Medications: Current Facility-Administered Medications  Medication Dose Route Frequency Provider Last Rate Last Dose  . acetaminophen (TYLENOL) tablet 650 mg  650 mg Oral Q6H PRN Nanine Means, NP   650 mg at 10/26/12 1630  . alum & mag hydroxide-simeth (MAALOX/MYLANTA) 200-200-20 MG/5ML suspension 30 mL  30 mL Oral Q4H PRN Nanine Means, NP   30 mL at 10/23/12 2130  . chlordiazePOXIDE (LIBRIUM) capsule 25 mg  25 mg Oral TID PRN Rachael Fee, MD   25 mg at 10/30/12 2158  . cloNIDine (CATAPRES) tablet 0.1 mg  0.1 mg Oral TID Rachael Fee, MD   0.1 mg at 10/31/12 1156  . dicyclomine (BENTYL) tablet 20 mg  20 mg Oral Q6H PRN Rachael Fee, MD   20 mg at 10/31/12 1761  . gabapentin (NEURONTIN) capsule 200 mg  200 mg Oral TID Sanjuana Kava, NP   200 mg at 10/31/12 1156  . hydrOXYzine (ATARAX/VISTARIL) tablet 25 mg  25 mg Oral Q6H PRN Rachael Fee, MD   25 mg at 10/30/12 2158  . loperamide (IMODIUM) capsule 2-4 mg  2-4 mg Oral PRN Rachael Fee, MD      . magnesium hydroxide (MILK OF MAGNESIA) suspension 30 mL  30 mL Oral Daily PRN Nanine Means, NP      . methocarbamol (ROBAXIN) tablet 500 mg  500 mg Oral Q8H PRN Rachael Fee, MD   500 mg at 10/31/12 9147  . naproxen (NAPROSYN) tablet 500 mg  500 mg Oral BID PRN Rachael Fee, MD   500 mg at 10/30/12 2157  . ondansetron (ZOFRAN-ODT) disintegrating tablet 4 mg  4 mg Oral Q6H PRN Rachael Fee, MD   4 mg at 10/30/12 2158  . traZODone (DESYREL) tablet 50 mg  50 mg Oral QHS,MR X 1 Rachael Fee, MD   50 mg at 10/30/12 2236    Lab Results: No  results found for this or any previous visit (from the past 48 hour(s)).  Physical Findings: AIMS: Facial and Oral Movements Muscles of Facial Expression: None, normal Lips and Perioral Area: None, normal Jaw: None, normal Tongue: None, normal,Extremity Movements Upper (arms, wrists, hands, fingers): None, normal Lower (legs, knees, ankles, toes): None, normal, Trunk Movements Neck, shoulders, hips: None, normal, Overall Severity Severity of abnormal movements (highest score from questions above): None, normal Incapacitation due to abnormal movements: None, normal Patient's awareness of abnormal movements (rate only patient's report): No Awareness, Dental Status Current problems with teeth and/or dentures?: No Does patient usually wear dentures?: No  CIWA:  CIWA-Ar Total: 0 COWS:  COWS Total Score: 5  Treatment Plan Summary: Daily contact with patient to assess and evaluate symptoms and progress in treatment Medication management  Plan: Supportive approach/coping skills/relapse prevention           Continue to monitor the S/S of withdrawal and address them            CBT/mindfulness  Medical Decision Making Problem Points:  Review of last therapy session (1) and Review of psycho-social stressors (1) Data Points:  Review of medication regiment & side effects (2) Review of new medications or change in dosage (2)  I certify that inpatient services furnished can reasonably be expected to improve the patient's condition.   [name] A 10/31/2012, 3:41 PM

## 2012-10-31 NOTE — Tx Team (Signed)
Interdisciplinary Treatment Plan Update (Adult)  Date: 10/31/2012   Time Reviewed: 12:34 PM  Progress in Treatment:  Attending groups: Yes Participating in groups:  Yes Taking medication as prescribed: Yes  Tolerating medication: Yes  Family/Significant othe contact made:Yes.  Pt's father.   Patient understands diagnosis: Yes, AEB seeking treatment for heroin detox and depression.  Discussing patient identified problems/goals with staff: Yes  Medical problems stabilized or resolved: Yes  Denies suicidal/homicidal ideation: Yes during admission/self report Patient has not harmed self or Others: Yes  New problem(s) identified:  Discharge Plan or Barriers: Pt unable to be admitted into [facility] due to pending court dates. [facility] application completed and faxed. [facility] application completed and faxed. Pt planning to stay with a friend and followup at [facility] until admission date at [facility] 9/15. Pt is Danielle Chandler been living with her grandmother in [place] and is eligible for [employer].   Additional comments: n/a  Reason for Continuation of Hospitalization: Medication management Mood stabilzation Estimated length of stay: 1-2 days For review of initial/current patient goals, please see plan of care.  Attendees:  Patient:    Family:    Physician: Geoffery Lyons MD  10/31/2012 12:34 PM   Nursing: Philippa Chester RN  10/31/2012 12:34 PM   Clinical Social Worker Clearnce Leja Smart, LCSWA  10/31/2012 12:34 PM   Other:  10/31/2012 12:34 PM   Other: Aggie PA 10/31/2012 12:34 PM   Other:  Darden Dates Nurse CM 10/31/2012 12:34 PM   Other:    Scribe for Treatment Team:  Trula Slade LCSWA 10/31/2012 12:34 PM

## 2012-10-31 NOTE — Progress Notes (Signed)
Adult Psychoeducational Group Note  Date:  10/31/2012 Time:  6:37 PM  Group Topic/Focus:  Dimensions of Wellness:   The focus of this group is to introduce the topic of wellness and discuss the role each dimension of wellness plays in total health.  Participation Level:  Minimal  Participation Quality:  Attentive and Supportive  Affect:  Appropriate  Cognitive:  Appropriate  Insight: Good and Improving  Engagement in Group:  Engaged and Improving  Modes of Intervention:  Discussion, Education and Support  Additional Comments:    Reynolds Bowl 10/31/2012, 6:37 PM

## 2012-11-01 DIAGNOSIS — F322 Major depressive disorder, single episode, severe without psychotic features: Secondary | ICD-10-CM

## 2012-11-01 DIAGNOSIS — F1120 Opioid dependence, uncomplicated: Secondary | ICD-10-CM

## 2012-11-01 MED ORDER — HYDROXYZINE HCL 25 MG PO TABS: 25.0000 mg | ORAL_TABLET | Freq: Four times a day (QID) | ORAL | Status: DC | PRN

## 2012-11-01 MED ORDER — GABAPENTIN 100 MG PO CAPS: 200.0000 mg | ORAL_CAPSULE | Freq: Three times a day (TID) | ORAL | Status: DC

## 2012-11-01 MED ORDER — BUPRENORPHINE HCL 2 MG SL SUBL
2.0000 mg | SUBLINGUAL_TABLET | Freq: Once | SUBLINGUAL | Status: AC
  Administered 2012-11-01: 2 mg via SUBLINGUAL
  Filled 2012-11-01: qty 1

## 2012-11-01 MED ORDER — ACETAMINOPHEN 500 MG PO TABS: ORAL_TABLET | ORAL | Status: AC

## 2012-11-01 MED ORDER — TRAZODONE HCL 50 MG PO TABS: 50.0000 mg | ORAL_TABLET | Freq: Every evening | ORAL | Status: DC | PRN

## 2012-11-01 NOTE — Progress Notes (Signed)
Patient did not attend 0900 orientation group. 

## 2012-11-01 NOTE — [facility] Suicide Risk Assessment (Signed)
Suicide Risk Assessment  Discharge Assessment     Demographic Factors:  Caucasian and NA  Mental Status Per Nursing Assessment::   On Admission:  NA  Current Mental Status by Physician: In full contact with reality. There are no suicidal ideas, plans or intent. Her mood is worried. Her affect is appropriate. States she is committed to make this work. States that she is going to stay with her father who is going to be providing support, accountability. She is going to to to meetings, follow up with [facility] while waiting for the [facility] bed the 15. She is optimistic. She asked for one more Subutex to help with the transition. She states she is trough her relapses.    Loss Factors: Legal issues  Historical Factors: NA  Risk Reduction Factors:   Responsible for children under 55 years of age, Sense of responsibility to family, Living with another person, especially a relative and Positive social support  Continued Clinical Symptoms:  Alcohol/Substance Abuse/Dependencies  Cognitive Features That Contribute To Risk:  Polarized thinking Thought constriction (tunnel vision)    Suicide Risk:  Minimal: No identifiable suicidal ideation.  Patients presenting with no risk factors but with morbid ruminations; may be classified as minimal risk based on the severity of the depressive symptoms  Discharge Diagnoses:   AXIS I:  Opioid Dependence, Opioid Withdrawal, Anxiety Disorder NOS, Depressive Disorder NOS AXIS II:  Deferred AXIS III:   Past Medical History  Diagnosis Date  . Headache(784.0)   . Anxiety   . Drug use   . CAP (community acquired pneumonia)   . Depression    AXIS IV:  other psychosocial or environmental problems AXIS V:  61-70 mild symptoms  Plan Of Care/Follow-up recommendations:  Activity:  as tolerated Diet:  regular Follow up [facility] and then [facility] residential Sept 15, AA/NA Is patient on multiple antipsychotic therapies at discharge:  No   Has Patient had  three or more failed trials of antipsychotic monotherapy by history:  No  Recommended Plan for Multiple Antipsychotic Therapies: NA  Danielle Chandler A 11/01/2012, 12:16 PM

## 2012-11-01 NOTE — Discharge Summary (Signed)
Physician Discharge Summary Note  Patient:  Danielle Chandler is an 27 y.o., female MRN:  [id number] DOB:  16-Oct-1985 Patient phone:  [phone] (home)  Patient address:   [address] [place] [address],   Date of Admission:  10/21/2012  Date of Discharge: 11/01/12  Reason for Admission:  Opiate detox  Discharge Diagnoses: Principal Problem:   Polysubstance abuse Active Problems:   Heroin dependence   Opioid dependence   Anxiety state, unspecified  Review of Systems  Constitutional: Negative.   HENT: Negative.   Eyes: Negative.   Respiratory: Negative.   Cardiovascular: Negative.   Gastrointestinal: Negative.   Genitourinary: Negative.   Musculoskeletal: Negative.   Skin: Negative.   Neurological: Negative.   Endo/Heme/Allergies: Negative.   Psychiatric/Behavioral: Positive for depression (Stabilizesd with medication prior to discharge) and substance abuse (Opioid addiction/dependence). Negative for suicidal ideas, hallucinations and memory loss. The patient is nervous/anxious (Satbilized with medication prior to discharge) and has insomnia (Stabilized with medication prior to discharge).     DSM5: Schizophrenia Disorders:  NA Obsessive-Compulsive Disorders:  NA Trauma-Stressor Disorders:  Anxiety disorder Substance/Addictive Disorders:  Opioid Disorder - Severe (304.00) Depressive Disorders:  Major Depressive Disorder - Severe (296.23)  Axis Diagnosis:   AXIS I:  Opioid disorder, severe, Major depressive disorder, severe AXIS II:  Deferred AXIS III:   Past Medical History  Diagnosis Date  . Headache(784.0)   . Anxiety   . Drug use   . CAP (community acquired pneumonia)   . Depression    AXIS IV:  other psychosocial or environmental problems and Opioid dependence AXIS V:  63  Level of Care:  [facility]  Hospital Course:  Patient presents for heroin and benzodiazepine detox. She has been using IV heroin over the past year. Her last use was two nights ago  and her last Xanax was yesterday am, marijuana about once weekly. Ms. Cotugno was initially seeking Suboxone detox but told on admission that we use the clonidine protocol, only in extreme cases is Suboxone used and rehab is not done here for Suboxone. Currently experiencing joint pains and reports diarrhea and vomiting. Drug use increases with sickness and stress, decreases when she focuses on her children--age 2 and 5. Ms. Looney was at [facility] in March 2014 for heroin detox but stated she got pneumonia and was given dilaudid that facilitated her relapse on heroin. She was charged with a DWI in June and larceny and another charge in July: Court dates on 10/25/12 and the other ones in September. Ms. Mctier resides with her mother and children and her relationship with her parents is good when she is not using drugs. Clonidine protocol started, Xanax decreased from 1 mg TID to BID and will continue to taper.  Upon admission into this hospital and after admission assessment/assessment including UDS/toxicology results that shows (+) Opiates and Benzodiazepine, it was then determined that Dorothey will need detoxification treatment protocol to stabilize her systems of drug intoxication and to combat their withdrawal symptoms as well. She was then started on clonidine detoxification treatment protocol. It was also agreed upon by patient and her provider that she is a heroin addict. This is detemined by the probable fact that she does go into withdrawal symptoms when coming off of this drug or when there is not an adequate amount of heroin in her systems. It is believed that the dependency on this drug is associated with the harsh withdrawal symptoms felt by the patient when coming off of this drug.  And because the withdrawal symptoms are uncomfortable and most times unbearable for [place], she has to almost use to feel normal again, only that her feeling of normalcy under the influence of heroin is indeed abnormal.  As her  detoxification treatment with clonidine protocol continued, Lalah continue to endorse and complain of some harsh withdrawal symptoms. She was started on a brief Subutex treatment in a tapering dose regimen to aid the detox treatment. Yee responded well to her detoxification treatment. This evidenced by her daily reports of improved mood, reductions of withdrawal symptoms and presentation of good affect and the determination to kick her heroin addiction. He plans to do this by strictly abiding by the instruction of his treatment regimen, follow-up care for her psychiatric issues/substance abuse problems and routine medication management. She will start residential treatment on 11/14/12 at 08:00 am at [facility] treatment center. Her follow-up care for medication management/routine psychiatric care will be at the [facility] here in [place], [place]. This is a walk-in appointment, Monday thru Thursday between the hours of 08 and 09:00 am. The addresses, dates, times and contact information for these appointments provided for patient in writing.   As to what patient has learned from being in this hospital. Maudry states that she learned to accept the facts that she is a heroin addict. He is currently being discharged to his home with family till 11/14/12. Upon discharge, patient adamantly denies any suicidal, homicidal ideations, auditory, visual hallucinations, paranoia, delusional thoughts and or withdrawal symptoms. He left [facility] with all personal belongings in no apparent distress. Transportation per family.  Consults:  psychiatry  Significant Diagnostic Studies:  labs: CBC with diff, CMP, UDS, Toxicology tests, U/A  Discharge Vitals:   Blood pressure 113/63, pulse 88, temperature 98 F (36.7 C), temperature source Oral, resp. rate 16, height 5' 2.99" (1.6 m), weight 82.555 kg (182 lb), last menstrual period 09/19/2012. Body mass index is 32.25 kg/(m^2). Lab Results:   No results found for this  or any previous visit (from the past 72 hour(s)).  Physical Findings: AIMS: Facial and Oral Movements Muscles of Facial Expression: None, normal Lips and Perioral Area: None, normal Jaw: None, normal Tongue: None, normal,Extremity Movements Upper (arms, wrists, hands, fingers): None, normal Lower (legs, knees, ankles, toes): None, normal, Trunk Movements Neck, shoulders, hips: None, normal, Overall Severity Severity of abnormal movements (highest score from questions above): None, normal Incapacitation due to abnormal movements: None, normal Patient's awareness of abnormal movements (rate only patient's report): No Awareness, Dental Status Current problems with teeth and/or dentures?: No Does patient usually wear dentures?: No  CIWA:  CIWA-Ar Total: 0 COWS:  COWS Total Score: 5  Psychiatric Specialty Exam: See Psychiatric Specialty Exam and Suicide Risk Assessment completed by Attending Physician prior to discharge.  Discharge destination:  Home, then to [facility] on 11/14/12  Is patient on multiple antipsychotic therapies at discharge:  No   Has Patient had three or more failed trials of antipsychotic monotherapy by history:  No  Recommended Plan for Multiple Antipsychotic Therapies: NA     Medication List    STOP taking these medications       ALPRAZolam 0.5 MG tablet  Commonly known as:  XANAX      TAKE these medications     Indication   acetaminophen 500 MG tablet  Commonly known as:  TYLENOL  Take 2 tablets every 6 hours: For pain   Indication:  Pain     gabapentin 100 MG capsule  Commonly  known as:  NEURONTIN  Take 2 capsules (200 mg total) by mouth 3 (three) times daily. For pain/anxiety   Indication:  Agitation, Pain, Anxiety     hydrOXYzine 25 MG tablet  Commonly known as:  ATARAX/VISTARIL  Take 1 tablet (25 mg total) by mouth every 6 (six) hours as needed for anxiety.   Indication:  Anxiety associated with Organic Disease     traZODone 50  MG tablet  Commonly known as:  DESYREL  Take 1 tablet (50 mg total) by mouth at bedtime and may repeat dose one time if needed. For sleep   Indication:  Trouble Sleeping       Follow-up Information   Follow up with [facility]. (Walk in between 8AM-9AM Monday through Friday for hospital followup/medication management. )    Contact information:   [address], [place] [address] Phone: [phone] Fax: [phone]      Follow up with [facility] On 11/14/2012. (Arrive at [facility] for admission. Make sure to bring photo ID, 30 day medication supply, and clothing. )    Contact information:   [address] [place], [place] [address] Phone: [phone] Fax: [phone]     Follow-up recommendations: Activity:  As tolerated Diet: As recommended by your primary care doctor. Keep all scheduled follow-up appointments as recommended.  Continue to work your relapse prevention plan Comments: Take all your medications as prescribed by your mental healthcare provider. Report any adverse effects and or reactions from your medicines to your outpatient provider promptly. Patient is instructed and cautioned to not engage in alcohol and or illegal drug use while on prescription medicines. In the event of worsening symptoms, patient is instructed to call the crisis hotline, 911 and or go to the nearest ED for appropriate evaluation and treatment of symptoms. Follow-up with your primary care provider for your other medical issues, concerns and or health care needs.    Total Discharge Time:  Greater than 30 minutes.  Signed: Sanjuana Kava, PMHNP-BC 11/01/2012, 10:20 AM Agree with assessment and plan Reymundo Poll. Dub Mikes, M.D.

## 2012-11-01 NOTE — Progress Notes (Signed)
[facility] Adult Case Management Discharge Plan :  Will you be returning to the same living situation after discharge: Yes,  home with grandmother At discharge, do you have transportation home?:Yes,  grandmother Do you have the ability to pay for your medications:Yes,  Medicaid  Release of information consent forms completed and in the chart;  Patient's signature needed at discharge.  Patient to Follow up at: Follow-up Information   Follow up with [facility]. (Walk in between 8AM-9AM Monday through Friday for hospital followup/medication management. )    Contact information:   [address], [place] [address] Phone: [phone] Fax: [phone]      Follow up with [facility] On 11/14/2012. (Arrive at [facility] for admission. Make sure to bring photo ID, 30 day medication supply, and clothing. )    Contact information:   [address] [place], [place] [address] Phone: [phone] Fax: [phone]      Patient denies SI/HI:   Yes,  during admission and stay at [facility] self report    Safety Planning and Suicide Prevention discussed:  Yes,  SPE not required for this pt as she did not endorse SI during admission or during stay at [facility] SPI pamphlet provided to pt and she was encouraged to ask questions, talk about concerns, and share information with her support system.   Smart, Daivd Fredericksen 11/01/2012, 9:45 AM

## 2012-11-01 NOTE — Progress Notes (Signed)
Patient ID: Danielle Chandler, female   DOB: Mar 03, 1985, 27 y.o.   MRN: [id number] D:  Patient discharged to home.  All belongings retrieved from room and from search room.  Patient denies suicidal thoughts or thoughts of self harm.   A:  Reviewed all discharge instructions, medications, and follow up care.  Patient given a two week supply of medications from the hospital pharmacy and prescriptions for renewals.  She was escorted to the search room and to the lobby.  R:  Verbalized understanding of all discharge instructions.  States she is ready to leave the hospital and has a plan in place to stay safe and not use until she is able to get into [facility] later in the month.

## 2012-11-04 NOTE — Progress Notes (Signed)
Patient Discharge Instructions:  After Visit Summary (AVS):   Faxed to:  11/04/12 Discharge Summary Note:   Faxed to:  11/04/12 Psychiatric Admission Assessment Note:   Faxed to:  11/04/12 Suicide Risk Assessment - Discharge Assessment:   Faxed to:  11/04/12 Faxed/Sent to the Next Level Care provider:  11/04/12 Faxed to [facility] @ [phone] Faxed to [facility] @ [phone]  Jerelene Redden, 11/04/2012, 2:56 PM

## 2014-01-01 ENCOUNTER — Encounter (HOSPITAL_COMMUNITY): Payer: Self-pay | Admitting: Psychiatry

## 2014-04-01 ENCOUNTER — Encounter (HOSPITAL_COMMUNITY): Payer: Self-pay | Admitting: Emergency Medicine

## 2014-04-01 ENCOUNTER — Emergency Department (HOSPITAL_COMMUNITY)
Admission: EM | Admit: 2014-04-01 | Discharge: 2014-04-01 | Disposition: A | Discharge status: Home / Self Care | Payer: Medicaid Other | Attending: Emergency Medicine | Admitting: Emergency Medicine

## 2014-04-01 DIAGNOSIS — F11220 Opioid dependence with intoxication, uncomplicated: Secondary | ICD-10-CM | POA: Insufficient documentation

## 2014-04-01 DIAGNOSIS — F329 Major depressive disorder, single episode, unspecified: Secondary | ICD-10-CM | POA: Insufficient documentation

## 2014-04-01 DIAGNOSIS — Z79899 Other long term (current) drug therapy: Secondary | ICD-10-CM | POA: Insufficient documentation

## 2014-04-01 DIAGNOSIS — Z87891 Personal history of nicotine dependence: Secondary | ICD-10-CM | POA: Insufficient documentation

## 2014-04-01 DIAGNOSIS — F419 Anxiety disorder, unspecified: Secondary | ICD-10-CM | POA: Insufficient documentation

## 2014-04-01 DIAGNOSIS — Z8701 Personal history of pneumonia (recurrent): Secondary | ICD-10-CM | POA: Insufficient documentation

## 2014-04-01 LAB — CBC
HEMATOCRIT: 40.8 % (ref 36.0–46.0)
Hemoglobin: 13.4 g/dL (ref 12.0–15.0)
MCH: 30.2 pg (ref 26.0–34.0)
MCHC: 32.8 g/dL (ref 30.0–36.0)
MCV: 92.1 fL (ref 78.0–100.0)
Platelets: 334 10*3/uL (ref 150–400)
RBC: 4.43 MIL/uL (ref 3.87–5.11)
RDW: 12.9 % (ref 11.5–15.5)
WBC: 8.4 10*3/uL (ref 4.0–10.5)

## 2014-04-01 NOTE — Discharge Instructions (Signed)
Narcotic Overdose A narcotic overdose is the misuse or overuse of a narcotic drug. A narcotic overdose can make you pass out and stop breathing. If you are not treated right away, this can cause permanent brain damage or stop your heart. Medicine may be given to reverse the effects of an overdose. If so, this medicine may bring on withdrawal symptoms. The symptoms may be abdominal cramps, throwing up (vomiting), sweating, chills, and nervousness. Injecting narcotics can cause more problems than just an overdose. AIDS, hepatitis, and other very serious infections are transmitted by sharing needles and syringes. If you decide to quit using, there are medicines which can help you through the withdrawal period. Trying to quit all at once on your own can be uncomfortable, but not life-threatening. Call your caregiver, Narcotics Anonymous, or any drug and alcohol treatment program for further help.  Document Released: 03/26/2004 Document Revised: 05/11/2011 Document Reviewed: 01/18/2009 [facility] Patient Information 2015 [place], [place]. This information is not intended to replace advice given to you by your health care provider. Make sure you discuss any questions you have with your health care provider.  Opioid Use Disorder Opioid use disorder is a mental disorder. It is the continued nonmedical use of opioids in spite of risks to health and well-being. Misused opioids include the street drug heroin. They also include pain medicines such as morphine, hydrocodone, oxycodone, and fentanyl. Opioids are very addictive. People who misuse opioids get an exaggerated feeling of well-being. Opioid use disorder often disrupts activities at home, work, or school. It may cause mental or physical problems.  A family history of opioid use disorder puts you at higher risk of it. People with opioid use disorder often misuse other drugs or have mental illness such as depression, posttraumatic stress disorder, or antisocial  personality disorder. They also are at risk of suicide and death from overdose. SIGNS AND SYMPTOMS  Signs and symptoms of opioid use disorder include:  Use of opioids in larger amounts or over a longer period than intended.  Unsuccessful attempts to cut down or control opioid use.  A lot of time spent obtaining, using, or recovering from the effects of opioids.  A strong desire or urge to use opioids (craving).  Continued use of opioids in spite of major problems at work, school, or home because of use.  Continued use of opioids in spite of relationship problems because of use.  Giving up or cutting down on important life activities because of opioid use.  Use of opioids over and over in situations when it is physically hazardous, such as driving a car.  Continued use of opioids in spite of a physical problem that is likely related to use. Physical problems can include:  Severe constipation.  Poor nutrition.  Infertility.  Tuberculosis.  Aspiration pneumonia.  Infections such as human immunodeficiency virus (HIV) and hepatitis (from injecting opioids).  Continued use of opioids in spite of a mental problem that is likely related to use. Mental problems can include:  Depression.  Anxiety.  Hallucinations.  Sleep problems.  Loss of sexual function.  Need to use more and more opioids to get the same effect, or lessened effect over time with use of the same amount (tolerance).  Having withdrawal symptoms when opioid use is stopped, or using opioids to reduce or avoid withdrawal symptoms. Withdrawal symptoms include:  Depressed, anxious, or irritable mood.  Nausea, vomiting, diarrhea, or intestinal cramping.  Muscle aches or spasms.  Excessive tearing or runny nose.  Dilated pupils, sweating,  or hairs standing on end.  Yawning.  Fever, raised blood pressure, or fast pulse.  Restlessness or trouble sleeping. This does not apply to people taking opioids for  medical reasons only. DIAGNOSIS Opioid use disorder is diagnosed by your health care provider. You may be asked questions about your opioid use and and how it affects your life. A physical exam may be done. A drug screen may be ordered. You may be referred to a mental health professional. The diagnosis of opioid use disorder requires at least two symptoms within 12 months. The type of opioid use disorder you have depends on the number of signs and symptoms you have. The type may be:  Mild. Two or three signs and symptoms.   Moderate. Four or five signs and symptoms.   Severe. Six or more signs and symptoms. TREATMENT  Treatment is usually provided by mental health professionals with training in substance use disorders.The following options are available:  Detoxification.This is the first step in treatment for withdrawal. It is medically supervised withdrawal with the use of medicines. These medicines lessen withdrawal symptoms. They also raise the chance of becoming opioid free.  Counseling, also known as talk therapy. Talk therapy addresses the reasons you use opioids. It also addresses ways to keep you from using again (relapse). The goals of talk therapy are to avoid relapse by:  Identifying and avoiding triggers for use.  Finding healthy ways to cope with stress.  Learning how to handle cravings.  Support groups. Support groups provide emotional support, advice, and guidance.  A medicine that blocks opioid receptors in your brain. This medicine can reduce opioid cravings that lead to relapse. This medicine also blocks the desired opioid effect when relapse occurs.  Opioids that are taken by mouth in place of the misused opioid (opioid maintenance treatment). These medicines satisfy cravings but are safer than commonly misused opioids. This often is the best option for people who continue to relapse with other treatments. HOME CARE INSTRUCTIONS   Take medicines only as directed  by your health care provider.  Check with your health care provider before starting new medicines.  Keep all follow-up visits as directed by your health care provider. SEEK MEDICAL CARE IF:  You are not able to take your medicines as directed.  Your symptoms get worse. SEEK IMMEDIATE MEDICAL CARE IF:  You have serious thoughts about hurting yourself or others.  You may have taken an overdose of opioids. FOR MORE INFORMATION  National Institute on Drug Abuse: http://www.price-smith.com/  Substance Abuse and Mental Health Services Administration: SkateOasis.com.pt Document Released: 12/14/2006 Document Revised: 07/03/2013 Document Reviewed: 03/01/2013 [facility] Patient Information 2015 [place], [place]. This information is not intended to replace advice given to you by your health care provider. Make sure you discuss any questions you have with your health care provider. Substance Abuse Treatment Programs  Intensive Outpatient Programs [facility]     [address]      [place], [place]                   [phone]       [facility] [address] [place] [address] [place], [place] [phone]  [facility]     (Inpatient and outpatient)     [address]           [phone]    [facility] [phone] (Suboxone and Methadone)  [address]  [place], [place] [address]      [phone]       [address] [place], [place] [phone]  [facility] (Outpatient/Inpatient, Chemical)    (insurance only) [phone]             [facility] (Groups & Residential) [place], [place] [phone]     [facility]     [address]     [place], [place]      [phone]       [facility] (for caregivers and family) [phone] [address] [place], [place] [phone]      Residential Treatment Programs [facility]      [address] [place],  [place] [address]  [phone]       [facility] [address], [place], [place] [address] [phone]  [facility]        [phone]       [facility] [phone]  [facility] ([facility]             [address]                                         [place], [place]                                                [phone] or [phone]                               [facility] [address] [place], [address] [phone]  [facility]    [address]      [place], [place]     [phone]       [facility] [address] [place], [place] [phone]  [facility]   [address] [place], [place] [address]     [phone]      Admissions: 8am-3pm M-F  [facility] (RTS) [address] [place], [place] [phone]  [facility] Program: Residential Program [phone] Days)   [place], [place]      [phone] or [phone]     [facility]: [facility] [place], [place] (Walk in Hours over the weekend or by referral)  [facility] [address] [place], [place], [place] [address] [phone]  Crisis Mobile: [facility]:  [phone] (for crisis response 24 hours a day) [facility] Hotline:      [phone] Outpatient Psychiatry and Counseling  [facility]: Mobile Crisis Management 24 hours:  [phone]  [facility] of the [employer] sliding scale fee and walk in schedule: M-F 8am-12pm/1pm-3pm [address]  [place], [place] [address] [phone]  [facility] [address] [place], [place] [address] [phone]  [facility] (Formerly known as [facility] [employer])- new patient walk-in appointments available Monday - Friday 8am -3pm.          [address] [place], [place] [address] [phone] or crisis line- [phone]  [facility]  Health Outpatient Services/ Intensive Outpatient Therapy Program [address] [place], [place] [address] [phone]  [facility]  Crisis Services      [phone]      [address]     [place], [place] [address]                 [facility]   [facility] [phone] [address] [place], [place] [address]   [facility]          [address] [place],  [place], [place] [address]       [phone]  [facility] [address] [place], [place] [address] [phone]  [facility]    [address]    [place], [place] [address]     [phone]       Andee PolesParish McKinney, MD     [address]     [place] [address]     [phone]       [facility] [address] [place] [address]  [facility]     [address]     [place], [place]      [phone]       [facility] Eulogio DitchShamsher Ahluwalia, MD [address] [place], [place] [address] [phone]  [facility]     [address]     [place], [place] [address]     [phone]       [facility] [address] [place], [place] [address] [phone] Resources for Temporary Residential Assistance/Crisis Centers  [facility]) M-F 8am-3pm   [address] [place], [place] [address]   [phone] Services include: laundry, barbering, support groups, case management, phone  & computer access, showers, AA/NA mtgs, mental health/substance abuse nurse, job skills class, disability information, VA assistance, spiritual classes, etc.   HOMELESS SHELTERS  [facility]     [facility]   [address], [place]     [phone]              [facility] (women and children)       [address] [place], [place]  [address] [phone] [email or web address] for application and process Application Required  [facility]   [address]    [place] [address]     [phone]                    [facility] [address] [place], [place] [address] [phone](schedule application appt.) Application Required  [facility] [place] [facility] (women only)    [address]     [place], [place] [address]     [phone]      Intake starts 6pm daily Need valid ID, SSC, & Police report [facility] [place] [address] [place], [place] [phone] Application Required  [facility] (men only)     [address]      [place], [place]     [phone]       [facility] [place] (Pregnant women only) [address] [place], [place] [phone]  [facility]      [address]      [place], [place] [address]     [phone]             [facility] [address] [place]  [place], [place] 90 day commitment/SA/Application process  [facility](men only)     [address]     [place], [place]       Check-in at [facility] of [facility] [address] [place], [place] [address] [phone] Men/Women/Women and Children must be there by 7 pm  [place], [place]

## 2014-04-01 NOTE — ED Provider Notes (Signed)
CSN: [id number]     Arrival date & time 04/01/14  1755 History   First MD Initiated Contact with Patient 04/01/14 1810     Chief Complaint  Patient presents with  . detox      (Consider location/radiation/quality/duration/timing/severity/associated sxs/prior Treatment) HPI Comments: Patient here requesting detox from heroin. She also occasionally uses benzos and alcohol. Her drug of choice however is heroin. Denies any suicidal or homicidal ideations. Has been using for the past 2 weeks after she was dismissed from an inpatient facility that she was at. Patient called behavior health hospital and told to come in for medical clearance. She denies any tremors at this time. No hallucinations. Abdominal pain, nausea vomiting. Vital signs are stable.  The history is provided by the patient.    Past Medical History  Diagnosis Date  . Headache(784.0)   . Anxiety   . Drug use   . CAP (community acquired pneumonia)   . Depression    Past Surgical History  Procedure Laterality Date  . Cholecystectomy    . Facial reconstruction surgery    . Cholecystectomy    . Ovarian cyst removal    . Tympanostomy tube placement    . Mastoidectomy revision     Family History  Problem Relation Age of Onset  . Diabetes Maternal Aunt   . Heart disease Maternal Aunt   . Diabetes Maternal Grandmother   . Diabetes Maternal Grandfather   . Heart disease Maternal Grandfather   . Anesthesia problems Neg Hx    History  Substance Use Topics  . Smoking status: Former Games developermoker  . Smokeless tobacco: Never Used  . Alcohol Use: No   OB History    Gravida Para Term Preterm AB TAB SAB Ectopic Multiple Living   2 2 2       2      Review of Systems  All other systems reviewed and are negative.     Allergies  Nubain  Home Medications   Prior to Admission medications   Medication Sig Start Date End Date Taking? Authorizing Provider  gabapentin (NEURONTIN) 100 MG capsule Take 2 capsules (200 mg total)  by mouth 3 (three) times daily. For pain/anxiety 11/01/12  Yes Sanjuana KavaAgnes I Nwoko, NP  traZODone (DESYREL) 50 MG tablet Take 1 tablet (50 mg total) by mouth at bedtime and may repeat dose one time if needed. For sleep 11/01/12  Yes Sanjuana KavaAgnes I Nwoko, NP  Vortioxetine HBr 10 MG TABS Take 1 tablet by mouth daily.   Yes Historical Provider, MD  acetaminophen (TYLENOL) 500 MG tablet Take 2 tablets every 6 hours: For pain Patient not taking: Reported on 04/01/2014 11/01/12   Sanjuana KavaAgnes I Nwoko, NP  hydrOXYzine (ATARAX/VISTARIL) 25 MG tablet Take 1 tablet (25 mg total) by mouth every 6 (six) hours as needed for anxiety. 11/01/12   Sanjuana KavaAgnes I Nwoko, NP   BP 121/75 mmHg  Pulse 95  Temp(Src) 98 F (36.7 C) (Oral)  Resp 20  SpO2 100%  LMP 03/16/2014 (Exact Date) Physical Exam  Constitutional: She is oriented to person, place, and time. She appears well-developed and well-nourished.  Non-toxic appearance. No distress.  HENT:  Head: Normocephalic and atraumatic.  Eyes: Conjunctivae, EOM and lids are normal. Pupils are equal, round, and reactive to light.  Neck: Normal range of motion. Neck supple. No tracheal deviation present. No thyroid mass present.  Cardiovascular: Normal rate, regular rhythm and normal heart sounds.  Exam reveals no gallop.   No murmur heard. Pulmonary/Chest: Effort normal  and breath sounds normal. No stridor. No respiratory distress. She has no decreased breath sounds. She has no wheezes. She has no rhonchi. She has no rales.  Abdominal: Soft. Normal appearance and bowel sounds are normal. She exhibits no distension. There is no tenderness. There is no rebound and no CVA tenderness.  Musculoskeletal: Normal range of motion. She exhibits no edema or tenderness.  Neurological: She is alert and oriented to person, place, and time. She has normal strength. No cranial nerve deficit or sensory deficit. GCS eye subscore is 4. GCS verbal subscore is 5. GCS motor subscore is 6.  Skin: Skin is warm and dry. No  abrasion and no rash noted.  Psychiatric: She has a normal mood and affect. Her speech is normal and behavior is normal.  Nursing note and vitals reviewed.   ED Course  Procedures (including critical care time) Labs Review Labs Reviewed  CBC  ACETAMINOPHEN LEVEL  COMPREHENSIVE METABOLIC PANEL  ETHANOL  SALICYLATE LEVEL  URINE RAPID DRUG SCREEN (HOSP PERFORMED)    Imaging Review No results found.   EKG Interpretation None      MDM   Final diagnoses:  Opioid dependence with uncomplicated intoxication    Spoke with TTS and there are no observation bedside behavior health nor or there any beds at behavior health. Patient has no acute psychiatric condition at this time. Patient already has follow-[facility] with [facility] and she was instructed to do that tomorrow. She is also given the behavior health resource guide    Toy Baker, MD 04/01/14 [phone]

## 2014-04-01 NOTE — ED Notes (Addendum)
Pt requesting detox from heroin and benzos, last use 2 hours ago. Pt states she felt like she OD because she never felt like this before when using.

## 2014-08-20 IMAGING — CT CT ANGIO CHEST
1 of 5 series · 5 of 36 positions shown · IV contrast (Omnipaque 300)
Comparison: PA and lateral chest 07/10/2012.

CLINICAL DATA: Back pain.

CT ANGIOGRAPHY CHEST
TECHNIQUE: Multidetector CT imaging of the chest using the
standard protocol during bolus administration of intravenous
contrast. Multiplanar reconstructed images including MIPs were
obtained and reviewed to evaluate the vascular anatomy.
Contrast: 100mL OMNIPAQUE IOHEXOL 350 MG/ML SOLN

[Series 4: pe 3.0 b40f · axial · 0.59mm/px · z∈[+1008,+1185]mm · 5 of 89 slices shown]
[im 15/89  lung]
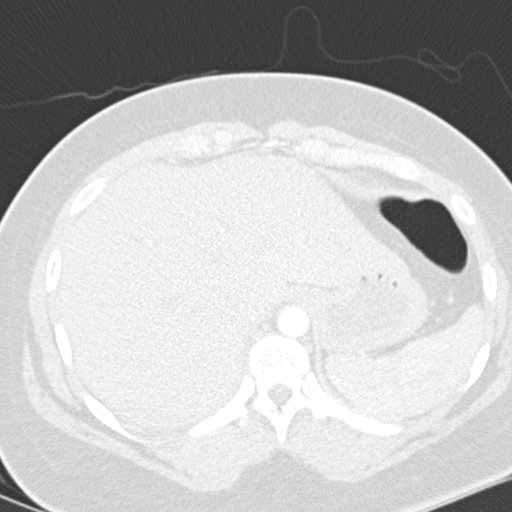
[im 30/89  mediastinal]
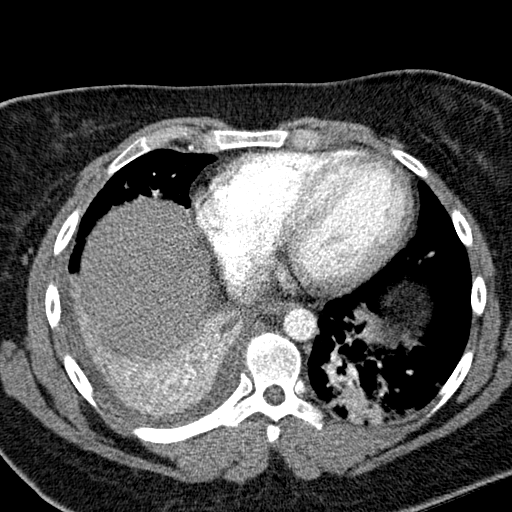
[im 45/89  lung]
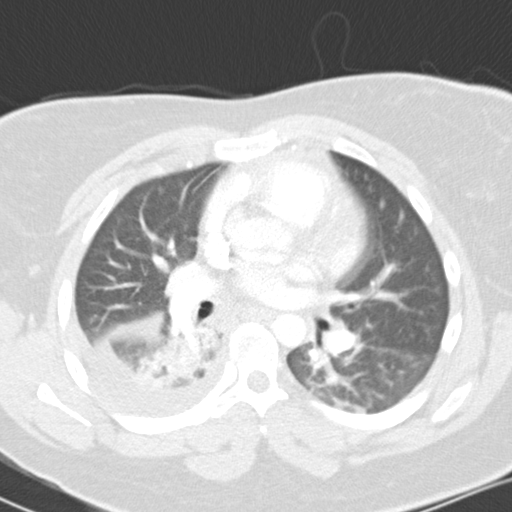
[im 59/89  mediastinal]
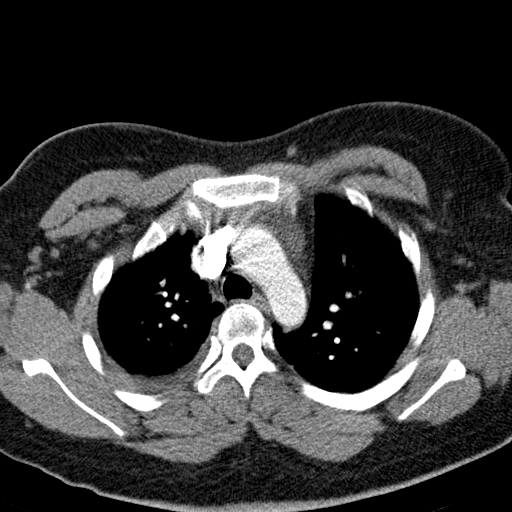
[im 74/89  lung]
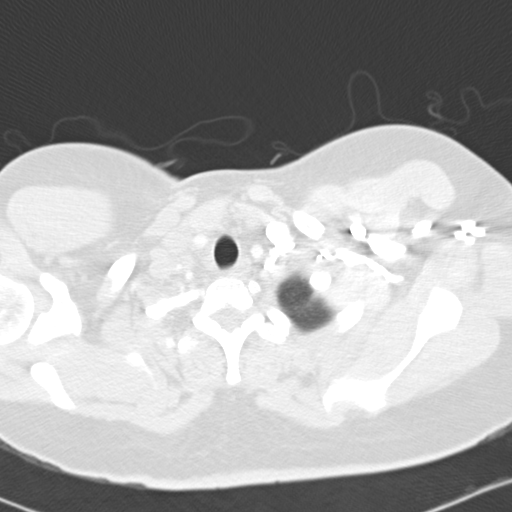

[5 of 36 positions shown; findings below may reference images not displayed]

FINDINGS: No pulmonary embolus is identified.  There is a small
right pleural effusion.  No left pleural effusion or pericardial
effusion.  Heart size is upper normal.  Lungs demonstrate bibasilar
airspace disease, worse on the right, with an appearance most
compatible with pneumonia.  Incidentally imaged upper abdomen is
unremarkable.  No focal bony abnormality is identified.
IMPRESSION: 1.  Negative for pulmonary embolus.
2.  Right worse than left lower lobe airspace disease and a small
right effusion most consistent with pneumonia.

## 2017-03-12 ENCOUNTER — Encounter (HOSPITAL_COMMUNITY): Payer: Self-pay | Admitting: Cardiology

## 2017-03-12 ENCOUNTER — Emergency Department (HOSPITAL_COMMUNITY): Payer: Medicaid Other

## 2017-03-12 ENCOUNTER — Emergency Department (HOSPITAL_COMMUNITY)
Admission: EM | Admit: 2017-03-12 | Discharge: 2017-03-12 | Disposition: A | Discharge status: Home / Self Care | Payer: Medicaid Other | Attending: Emergency Medicine | Admitting: Emergency Medicine

## 2017-03-12 DIAGNOSIS — R079 Chest pain, unspecified: Secondary | ICD-10-CM | POA: Diagnosis present

## 2017-03-12 DIAGNOSIS — F419 Anxiety disorder, unspecified: Secondary | ICD-10-CM | POA: Diagnosis not present

## 2017-03-12 DIAGNOSIS — Z79899 Other long term (current) drug therapy: Secondary | ICD-10-CM | POA: Diagnosis not present

## 2017-03-12 DIAGNOSIS — F410 Panic disorder [episodic paroxysmal anxiety] without agoraphobia: Secondary | ICD-10-CM

## 2017-03-12 HISTORY — DX: Other psychoactive substance abuse, uncomplicated: F19.10

## 2017-03-12 LAB — COMPREHENSIVE METABOLIC PANEL
ALBUMIN: 4.1 g/dL (ref 3.5–5.0)
ALK PHOS: 45 U/L (ref 38–126)
ALT: 24 U/L (ref 14–54)
ANION GAP: 11 (ref 5–15)
AST: 28 U/L (ref 15–41)
BUN: 14 mg/dL (ref 6–20)
CALCIUM: 9.2 mg/dL (ref 8.9–10.3)
CO2: 21 mmol/L — AB (ref 22–32)
CREATININE: 0.69 mg/dL (ref 0.44–1.00)
Chloride: 105 mmol/L (ref 101–111)
GFR calc non Af Amer: >60 mL/min (ref >60)
GLUCOSE: 89 mg/dL (ref 65–99)
Potassium: 3.6 mmol/L (ref 3.5–5.1)
SODIUM: 137 mmol/L (ref 135–145)
Total Bilirubin: 1 mg/dL (ref 0.3–1.2)
Total Protein: 7.8 g/dL (ref 6.5–8.1)

## 2017-03-12 LAB — URINALYSIS, ROUTINE W REFLEX MICROSCOPIC
BILIRUBIN URINE: NEGATIVE
Glucose, UA: NEGATIVE mg/dL
Ketones, ur: 20 mg/dL — AB
LEUKOCYTES UA: NEGATIVE
NITRITE: NEGATIVE
Protein, ur: NEGATIVE mg/dL
Specific Gravity, Urine: 1.011 (ref 1.005–1.030)
pH: 5 (ref 5.0–8.0)

## 2017-03-12 LAB — TROPONIN I: Troponin I: <0.03 ng/mL (ref <0.03)

## 2017-03-12 LAB — CBC WITH DIFFERENTIAL/PLATELET
Basophils Absolute: 0 10*3/uL (ref 0.0–0.1)
Basophils Relative: 0 %
EOS ABS: 0.9 10*3/uL — AB (ref 0.0–0.7)
Eosinophils Relative: 9 %
HCT: 39.3 % (ref 36.0–46.0)
HEMOGLOBIN: 13.2 g/dL (ref 12.0–15.0)
LYMPHS ABS: 2.4 10*3/uL (ref 0.7–4.0)
LYMPHS PCT: 22 %
MCH: 30.1 pg (ref 26.0–34.0)
MCHC: 33.6 g/dL (ref 30.0–36.0)
MCV: 89.5 fL (ref 78.0–100.0)
MONOS PCT: 4 %
Monocytes Absolute: 0.4 10*3/uL (ref 0.1–1.0)
NEUTROS PCT: 65 %
Neutro Abs: 7 10*3/uL (ref 1.7–7.7)
Platelets: 319 10*3/uL (ref 150–400)
RBC: 4.39 MIL/uL (ref 3.87–5.11)
RDW: 12.5 % (ref 11.5–15.5)
WBC: 10.7 10*3/uL — AB (ref 4.0–10.5)

## 2017-03-12 LAB — RAPID URINE DRUG SCREEN, HOSP PERFORMED
AMPHETAMINES: NOT DETECTED
Barbiturates: NOT DETECTED
Benzodiazepines: POSITIVE — AB
Cocaine: NOT DETECTED
OPIATES: NOT DETECTED
Tetrahydrocannabinol: NOT DETECTED

## 2017-03-12 LAB — D-DIMER, QUANTITATIVE: D-Dimer, Quant: 0.35 ug/mL-FEU (ref 0.00–0.50)

## 2017-03-12 LAB — ETHANOL: Alcohol, Ethyl (B): <10 mg/dL (ref <10)

## 2017-03-12 LAB — PREGNANCY, URINE: PREG TEST UR: NEGATIVE

## 2017-03-12 MED ORDER — HYDROXYZINE HCL 25 MG PO TABS: ORAL_TABLET | ORAL | 0 refills | Status: AC

## 2017-03-12 MED ORDER — ACETAMINOPHEN 325 MG PO TABS
650.0000 mg | ORAL_TABLET | Freq: Once | ORAL | Status: AC
  Administered 2017-03-12: 650 mg via ORAL
  Filled 2017-03-12: qty 2

## 2017-03-12 MED ORDER — IBUPROFEN 400 MG PO TABS
400.0000 mg | ORAL_TABLET | Freq: Once | ORAL | Status: DC
  Filled 2017-03-12: qty 1

## 2017-03-12 MED ORDER — HYDROXYZINE HCL 25 MG PO TABS
50.0000 mg | ORAL_TABLET | Freq: Once | ORAL | Status: AC
  Administered 2017-03-12: 50 mg via ORAL
  Filled 2017-03-12: qty 2

## 2017-03-12 MED ORDER — GI COCKTAIL ~~LOC~~
30.0000 mL | Freq: Once | ORAL | Status: AC
  Administered 2017-03-12: 30 mL via ORAL
  Filled 2017-03-12: qty 30

## 2017-03-12 NOTE — ED Notes (Signed)
Pt state vistaril will not help her, it is just benadryl and she took a a xananx and it did not help so this will not help. Ask pt to give it a try. Continuing to ask pt to use slow deep breathes. Pt states she is scared, advise pt her EKG look good and we are doing test to rule out and bad things.  Pt continues to say everyone is judging her.

## 2017-03-12 NOTE — ED Notes (Signed)
Gave pt wet wash cloth to wipe face and warm blankets. Pt state " what the Danielle Chandler is wrong with me? "  Ask pt about pain 7/10 in chest hurting through to back, gallbladder has been removed, pt has had some nausea during the week. EDP advised, will continue to watch for labs to result and make plan for pt.

## 2017-03-12 NOTE — ED Notes (Signed)
Pt calling RN to the room, states [facility] and needs to schedule next appointment. She is just now feeling comfortable talking to them and does not want to talk to anyone else, Will advise EDP

## 2017-03-12 NOTE — ED Provider Notes (Signed)
[facility]Y [facility] Provider Note   CSN: [id number] Arrival date & time: 03/12/17  1628     History   Chief Complaint Chief Complaint  Patient presents with  . Anxiety  . Chest Pain    HPI Danielle Chandler is a 32 y.o. female.   Anxiety  Associated symptoms include chest pain.  Chest Pain      Pt was seen at 1640. Per pt, c/o gradual onset and persistence of multiple symptoms for the past several days. Symptoms include: insomnia, anxiety, "feeling scared," acute flair of her chronic carpal tunnel syndrome, uncontrolled crying, chest pain, SOB, fingers tingling. Pt states her carpal tunnel pain is uncontrolled by OTC meds. Pt also states she is "in recovery" for drug abuse, and "just got out of jail." Denies cough, no fevers, no injury, no abd pain, no rash, no N/V/D, no back pain, no focal motor weakness, no neck pain.   Past Medical History:  Diagnosis Date  . Anxiety   . CAP (community acquired pneumonia)   . Depression   . Drug use   . Headache(784.0)   . Polysubstance abuse (HCC)    heroin, opiates, cocaine, marijuana    Patient Active Problem List   Diagnosis Date Noted  . Opioid dependence (HCC) 10/22/2012  . Anxiety state, unspecified 10/22/2012  . Polysubstance abuse (HCC) 07/10/2012  . Depression, major 07/10/2012  . 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 Heroin dependence (HCC) 05/28/2012    Past Surgical History:  Procedure Laterality Date  . CHOLECYSTECTOMY    . CHOLECYSTECTOMY    . FACIAL RECONSTRUCTION SURGERY    . MASTOIDECTOMY REVISION    . OVARIAN CYST REMOVAL    . TYMPANOSTOMY TUBE PLACEMENT      OB History    Gravida Para Term Preterm AB Living   2 2 2     2    SAB TAB Ectopic Multiple Live Births           1       Home Medications    Prior to Admission medications   Medication Sig Start Date End Date Taking? Authorizing Provider  acetaminophen (TYLENOL) 500 MG tablet Take 2 tablets every 6 hours: For  pain Patient not taking: Reported on 04/01/2014 11/01/12   Armandina StammerNwoko, Agnes I, NP  gabapentin (NEURONTIN) 100 MG capsule Take 2 capsules (200 mg total) by mouth 3 (three) times daily. For pain/anxiety 11/01/12   Armandina StammerNwoko, Agnes I, NP  hydrOXYzine (ATARAX/VISTARIL) 25 MG tablet Take 1 tablet (25 mg total) by mouth every 6 (six) hours as needed for anxiety. 11/01/12   Armandina StammerNwoko, Agnes I, NP  traZODone (DESYREL) 50 MG tablet Take 1 tablet (50 mg total) by mouth at bedtime and may repeat dose one time if needed. For sleep 11/01/12   Armandina StammerNwoko, Agnes I, NP  Vortioxetine HBr 10 MG TABS Take 1 tablet by mouth daily.    [provider]    Family History Family History  Problem Relation Age of Onset  . Diabetes Maternal Aunt   . Heart disease Maternal Aunt   . Diabetes Maternal Grandmother   . Diabetes Maternal Grandfather   . Heart disease Maternal Grandfather   . Anesthesia problems Neg Hx     Social History Social History   Tobacco Use  . Smoking status: Former Games developermoker  . Smokeless tobacco: Never Used  Substance Use Topics  . Alcohol use: No  . Drug use: Yes    Types: Marijuana, Heroin, IV  Comment: history of heroin use.      Allergies   Nubain [nalbuphine hcl]   Review of Systems Review of Systems  Cardiovascular: Positive for chest pain.  ROS: Statement: All systems negative except as marked or noted in the HPI; Constitutional: Negative for fever and chills. ; ; Eyes: Negative for eye pain, redness and discharge. ; ; ENMT: Negative for ear pain, hoarseness, nasal congestion, sinus pressure and sore throat. ; ; Cardiovascular: Negative for palpitations, diaphoresis, and peripheral edema. ; ; Respiratory: +CP, SOB, hyperventilating. Negative for cough, wheezing and stridor. ; ; Gastrointestinal: Negative for nausea, vomiting, diarrhea, abdominal pain, blood in stool, hematemesis, jaundice and rectal bleeding. . ; ; Genitourinary: Negative for dysuria, flank pain and hematuria. ; ;  Musculoskeletal: Negative for back pain and neck pain. Negative for swelling and trauma.; ; Skin: Negative for pruritus, rash, abrasions, blisters, bruising and skin lesion.; ; Neuro: +fingers paresthesias. Negative for headache, lightheadedness and neck stiffness. Negative for weakness, altered level of consciousness, altered mental status, extremity weakness, involuntary movement, seizure and syncope.; Psych:  +anxiety, panic attack. No SI, no SA, no HI, no hallucinations.      Physical Exam Updated Vital Signs BP 100/87   Pulse 93   Resp 17   Ht 5\' 3"  (1.6 m)   Wt 90.7 kg (200 lb)   SpO2 100%   BMI 35.43 kg/m     Physical Exam 1645: Physical examination:  Nursing notes reviewed; Vital signs and O2 SAT reviewed;  Constitutional: Well developed, Well nourished, Well hydrated, Crying and wailing loudly; Head:  Normocephalic, atraumatic; Eyes: EOMI, PERRL, No scleral icterus; ENMT: Mouth and pharynx normal, Mucous membranes moist; Neck: Supple, Full range of motion, No lymphadenopathy; Cardiovascular: Tachycardic rate and rhythm, No gallop; Respiratory: Breath sounds clear & equal bilaterally, No wheezes.  Speaking full sentences with ease, Normal respiratory effort/excursion. Intermittently holding her breath and then hyperventilating.;; Chest: Nontender, Movement normal; Abdomen: Soft, Nontender, Nondistended, Normal bowel sounds; Genitourinary: No CVA tenderness; Extremities: Pulses normal, No tenderness, No edema, No calf edema or asymmetry.; Neuro: AA&Ox3, Major CN grossly intact.  Speech clear. No gross focal motor or sensory deficits in extremities.; Skin: Color normal, Warm, Dry.; Psych:  Anxiety, crying.    ED Treatments / Results  Labs (all labs ordered are listed, but only abnormal results are displayed)   EKG  EKG Interpretation  Date/Time:  Friday March 12 2017 16:37:44 EST Ventricular Rate:  105 PR Interval:    QRS Duration: 113 QT Interval:  382 QTC  Calculation: 505 R Axis:   64 Text Interpretation:  Sinus tachycardia Borderline intraventricular conduction delay Prolonged QT interval Artifact Baseline wander When compared with ECG of 07/22/2012 Rate faster QT has lengthened Confirmed by Samuel Jester [phone]) on 03/12/2017 5:32:03 PM       Radiology   Procedures Procedures (including critical care time)  Medications Ordered in ED Medications  hydrOXYzine (ATARAX/VISTARIL) tablet 50 mg (50 mg Oral Given 03/12/17 1652)  acetaminophen (TYLENOL) tablet 650 mg (650 mg Oral Given 03/12/17 1726)  gi cocktail (Maalox,Lidocaine,Donnatal) (30 mLs Oral Given 03/12/17 1727)     Initial Impression / Assessment and Plan / ED Course  I have reviewed the triage vital signs and the nursing notes.  Pertinent labs & imaging results that were available during my care of the patient were reviewed by me and considered in my medical decision making (see chart for details).  MDM Reviewed: previous chart, nursing note and vitals Reviewed previous: labs and ECG  Interpretation: labs, ECG and x-ray   Results for orders placed or performed during the hospital encounter of 03/12/17  Comprehensive metabolic panel  Result Value Ref Range   Sodium 137 135 - 145 mmol/L   Potassium 3.6 3.5 - 5.1 mmol/L   Chloride 105 101 - 111 mmol/L   CO2 21 (L) 22 - 32 mmol/L   Glucose, Bld 89 65 - 99 mg/dL   BUN 14 6 - 20 mg/dL   Creatinine, Ser 1.61 0.44 - 1.00 mg/dL   Calcium 9.2 8.9 - 09.6 mg/dL   Total Protein 7.8 6.5 - 8.1 g/dL   Albumin 4.1 3.5 - 5.0 g/dL   AST 28 15 - 41 U/L   ALT 24 14 - 54 U/L   Alkaline Phosphatase 45 38 - 126 U/L   Total Bilirubin 1.0 0.3 - 1.2 mg/dL   GFR calc non Af Amer >60 >60 mL/min   GFR calc Af Amer >60 >60 mL/min   Anion gap 11 5 - 15  Ethanol  Result Value Ref Range   Alcohol, Ethyl (B) <10 <10 mg/dL  CBC with Differential  Result Value Ref Range   WBC 10.7 (H) 4.0 - 10.5 K/uL   RBC 4.39 3.87 - 5.11 MIL/uL    Hemoglobin 13.2 12.0 - 15.0 g/dL   HCT 04.5 40.9 - 81.1 %   MCV 89.5 78.0 - 100.0 fL   MCH 30.1 26.0 - 34.0 pg   MCHC 33.6 30.0 - 36.0 g/dL   RDW 91.4 78.2 - 95.6 %   Platelets 319 150 - 400 K/uL   Neutrophils Relative % 65 %   Neutro Abs 7.0 1.7 - 7.7 K/uL   Lymphocytes Relative 22 %   Lymphs Abs 2.4 0.7 - 4.0 K/uL   Monocytes Relative 4 %   Monocytes Absolute 0.4 0.1 - 1.0 K/uL   Eosinophils Relative 9 %   Eosinophils Absolute 0.9 (H) 0.0 - 0.7 K/uL   Basophils Relative 0 %   Basophils Absolute 0.0 0.0 - 0.1 K/uL  Troponin I  Result Value Ref Range   Troponin I <0.03 <0.03 ng/mL  D-dimer, quantitative  Result Value Ref Range   D-Dimer, Quant 0.35 0.00 - 0.50 ug/mL-FEU  Urinalysis, Routine w reflex microscopic  Result Value Ref Range   Color, Urine YELLOW YELLOW   APPearance CLEAR CLEAR   Specific Gravity, Urine 1.011 1.005 - 1.030   pH 5.0 5.0 - 8.0   Glucose, UA NEGATIVE NEGATIVE mg/dL   Hgb urine dipstick MODERATE (A) NEGATIVE   Bilirubin Urine NEGATIVE NEGATIVE   Ketones, ur 20 (A) NEGATIVE mg/dL   Protein, ur NEGATIVE NEGATIVE mg/dL   Nitrite NEGATIVE NEGATIVE   Leukocytes, UA NEGATIVE NEGATIVE   RBC / HPF TOO NUMEROUS TO COUNT 0 - 5 RBC/hpf   WBC, UA 0-5 0 - 5 WBC/hpf   Bacteria, UA RARE (A) NONE SEEN   Squamous Epithelial / LPF 0-5 (A) NONE SEEN   Mucus PRESENT   Pregnancy, urine  Result Value Ref Range   Preg Test, Ur NEGATIVE NEGATIVE  Urine rapid drug screen (hosp performed)  Result Value Ref Range   Opiates NONE DETECTED NONE DETECTED   Cocaine NONE DETECTED NONE DETECTED   Benzodiazepines POSITIVE (A) NONE DETECTED   Amphetamines NONE DETECTED NONE DETECTED   Tetrahydrocannabinol NONE DETECTED NONE DETECTED   Barbiturates NONE DETECTED NONE DETECTED   Dg Chest 2 View Result Date: 03/12/2017 CLINICAL DATA:  32 y/o  F; chest pain and anxiety. EXAM:  CHEST  2 VIEW COMPARISON:  07/20/2016 chest radiograph FINDINGS: Stable heart size and mediastinal  contours are within normal limits given projection and technique. Both lungs are clear. The visualized skeletal structures are unremarkable. IMPRESSION: No active cardiopulmonary disease. Electronically Signed   By: Mitzi Hansen M.D.   On: 03/12/2017 17:30     1645:  During HPI, pt informed ED RN x2 and myself that she was in recovery and "wasn't here for drugs." We then told pt that we would remain respectful of her sobriety and avoid any addictive medications, such as benzos and narcotics. Pt stated she "just wanted to be treated like everyone else" and "you all are treating me differently" and "judging me." We reiterated that no one was judging her, and we were remaining respectful of her "being clean" by not dosing her with addictive-type medications while she is in the ED.   1720:  Pt continues to cry, intermittently hold her breath then hyperventilate. ED RN and I attempted to console her and try to slow her breathing. Pt again insisting "everyone was being mean" to her. Stated the "vistaril isn't going to help because it's just benadryl." States she took a xanax PTA and "that didn't help." Keeps asking for pain meds; will dose APAP and motrin. Pt then states she also took naprosyn this afternoon. GI cocktail ordered. Pt again asking to "just be treated like everyone else." ED RN and I again reiterated we were treating her "like everyone else" and that we were remaining respectful of her sobriety.   1835:  Pt now calmer, stating her CP and SOB are improved. States she can "catch her breath now." Informed I would like to obtain TTS consult, as she had such uncontrolled crying for the past several hours. Pt refuses. States she is seeing a Veterinary surgeon at [facility] and does not want to talk to anyone else. Denies SI/HI/AVH, no psychosis. Strongly encouraged by ED RN and myself to f/u with [facility]. Dx and testing d/w pt.  Questions answered.  Verb understanding, agreeable to d/c home with outpt f/u.     Final Clinical Impressions(s) / ED Diagnoses   Final diagnoses:  None    ED Discharge Orders    None        Samuel Jester, DO 03/14/17 2246

## 2017-03-12 NOTE — ED Notes (Signed)
Pt is finally calming down, snubbing and talking. Asking for wrist splint bilaterally

## 2017-03-12 NOTE — ED Notes (Signed)
Pt continue to cry, states GI cocktail did not help. Pt can be heard from at nursing station with doors closed

## 2017-03-12 NOTE — ED Notes (Signed)
Father at nursing station, pt does not want father in the room, she wants to calm down. He state she will need a doctors note at discharge, due to her job, she went to work a week ago.

## 2017-03-12 NOTE — ED Notes (Signed)
Pt hyperventilating, uncontrolled crying, EDP at the bedside, non- re breather mask on pt per EDP, advising pt to take slow deep breathes

## 2017-03-12 NOTE — ED Notes (Signed)
Patient given discharge instruction, verbalized understand. IV removed, band aid applied. Patient ambulatory out of the department.  

## 2017-03-12 NOTE — ED Notes (Signed)
Pt up getting dressed, calm, thank] me for getting her calmed down. Will make appointment with [facility], denies HI/SI, waiting to go home to rest.

## 2017-03-12 NOTE — Discharge Instructions (Signed)
Take the prescription as directed.  Call your regular mental health provider on Monday to schedule a follow up appointment next week.  Return to the Emergency Department immediately sooner if worsening.

## 2017-03-12 NOTE — ED Triage Notes (Signed)
C/o chest pain.  Pt crying uncontrollably during triage.

## 2017-03-12 NOTE — ED Notes (Signed)
Pt at the bedside to advise pt of result and plan to consult TTS, due to uncontrolled crying. Pt is calm and states she can finally catch her breath, pharmacy reviewing meds at the bedside

## 2017-03-12 NOTE — ED Notes (Signed)
EDP at the bedside, also trying to console pt. Pt continues to yell and crying, taking mask off, assisted to bathroom in wheelchair for urine sample, pt back in bed. Pt states everyone is being mean since they found out she was use to use drugs. EDP and RN advise pt we are all trying to get you to calm down and take slow deep breathe to help you feel better. Pt going to xray at present. Tech ask if pt could stand and be still , pt said she would try.

## 2017-03-12 NOTE — ED Notes (Signed)
Ask pt if RN could leave the room to go check on other pt, she states it was fine. Father going back to waiting area, she did not want him in the room. He states he is sorry, call him back if we need him.

## 2017-03-12 NOTE — ED Notes (Signed)
Pt back from xray, crying, loudly, tech states they had to do xray in the bed, due to pt crying and unable to be still

## 2017-03-12 NOTE — ED Notes (Signed)
Trying to distraction, asking pt to talk about kids. Pt states she can not stop crying.
